# Patient Record
Sex: Female | Born: 1950 | Hispanic: Yes | Marital: Married | Smoking: Never smoker
Health system: Western US, Academic
[De-identification: ages and names within clinical notes are randomized; demographics above are authoritative.]

## PROBLEM LIST (undated history)

## (undated) DIAGNOSIS — H409 Unspecified glaucoma: Secondary | ICD-10-CM

## (undated) DIAGNOSIS — F32A Depression, unspecified: Secondary | ICD-10-CM

## (undated) DIAGNOSIS — F329 Major depressive disorder, single episode, unspecified: Secondary | ICD-10-CM

## (undated) DIAGNOSIS — M797 Fibromyalgia: Secondary | ICD-10-CM

## (undated) DIAGNOSIS — G43909 Migraine, unspecified, not intractable, without status migrainosus: Secondary | ICD-10-CM

## (undated) DIAGNOSIS — K219 Gastro-esophageal reflux disease without esophagitis: Secondary | ICD-10-CM

## (undated) DIAGNOSIS — M549 Dorsalgia, unspecified: Secondary | ICD-10-CM

## (undated) DIAGNOSIS — E7800 Pure hypercholesterolemia, unspecified: Secondary | ICD-10-CM

## (undated) DIAGNOSIS — E039 Hypothyroidism, unspecified: Secondary | ICD-10-CM

## (undated) DIAGNOSIS — I10 Essential (primary) hypertension: Secondary | ICD-10-CM

## (undated) DIAGNOSIS — I480 Paroxysmal atrial fibrillation: Secondary | ICD-10-CM

## (undated) DIAGNOSIS — E119 Type 2 diabetes mellitus without complications: Secondary | ICD-10-CM

## (undated) HISTORY — PX: CHOLECYSTECTOMY: SHX55

## (undated) HISTORY — DX: Essential (primary) hypertension: I10

## (undated) HISTORY — DX: Paroxysmal atrial fibrillation (CMS-HCC): I48.0

## (undated) HISTORY — DX: Pure hypercholesterolemia, unspecified: E78.00

## (undated) HISTORY — PX: HYSTERECTOMY: SHX81

## (undated) HISTORY — DX: Type 2 diabetes mellitus without complications (CMS-HCC): E11.9

## (undated) HISTORY — PX: TONSILLECTOMY AND ADENOIDECTOMY: SHX2683

## (undated) HISTORY — PX: BILE DUCT STENT PLACEMENT: SHX1227

## (undated) MED ORDER — LACTATED RINGERS IV SOLN
INTRAVENOUS | Status: AC
Start: 2016-12-18 — End: ?

## (undated) MED ORDER — LIDOCAINE HCL (PF) 1 % IJ SOLN
0.10 mL | INTRAMUSCULAR | Status: AC | PRN
Start: 2016-12-18 — End: ?

---

## 2009-03-17 ENCOUNTER — Ambulatory Visit (HOSPITAL_BASED_OUTPATIENT_CLINIC_OR_DEPARTMENT_OTHER): Admitting: Trauma Surgery

## 2009-05-12 ENCOUNTER — Ambulatory Visit (HOSPITAL_BASED_OUTPATIENT_CLINIC_OR_DEPARTMENT_OTHER): Admitting: Trauma Surgery

## 2009-06-23 ENCOUNTER — Ambulatory Visit (HOSPITAL_BASED_OUTPATIENT_CLINIC_OR_DEPARTMENT_OTHER): Admitting: Surgery

## 2009-06-23 ENCOUNTER — Ambulatory Visit (HOSPITAL_BASED_OUTPATIENT_CLINIC_OR_DEPARTMENT_OTHER): Payer: Self-pay | Admitting: Surgery

## 2009-10-07 ENCOUNTER — Institutional Professional Consult (permissible substitution) (HOSPITAL_BASED_OUTPATIENT_CLINIC_OR_DEPARTMENT_OTHER): Admitting: Rheumatology

## 2009-10-07 VITALS — BP 113/76 | HR 98 | Temp 98.2°F | Ht 65.0 in | Wt 184.0 lb

## 2009-10-07 MED ORDER — GLUCAGON (RDNA) 1 MG IJ KIT
1.00 mg | PACK | INTRAMUSCULAR | Status: DC | PRN
Start: ? — End: 2014-11-12

## 2009-10-07 MED ORDER — PROPRANOLOL HCL 20 MG OR TABS
20.00 mg | ORAL_TABLET | Freq: Two times a day (BID) | ORAL | Status: DC
Start: ? — End: 2014-10-11

## 2009-10-07 MED ORDER — AMITRIPTYLINE HCL 25 MG OR TABS
25.00 mg | ORAL_TABLET | Freq: Every evening | ORAL | Status: DC
Start: ? — End: 2016-08-30

## 2009-10-07 MED ORDER — SIMVASTATIN 20 MG OR TABS
20.00 mg | ORAL_TABLET | Freq: Every evening | ORAL | Status: DC
Start: ? — End: 2014-11-12

## 2009-10-07 MED ORDER — ACETAMINOPHEN 325 MG PO TABS
650.00 mg | ORAL_TABLET | ORAL | Status: DC | PRN
Start: ? — End: 2016-08-30

## 2009-10-07 MED ORDER — OXYCODONE-ACETAMINOPHEN 5-325 MG OR TABS
1.00 | ORAL_TABLET | Freq: Four times a day (QID) | ORAL | Status: DC | PRN
Start: ? — End: 2016-08-30

## 2009-10-07 MED ORDER — ASPIRIN 81 MG OR TABS
81.00 mg | ORAL_TABLET | Freq: Every day | ORAL | Status: DC
Start: ? — End: 2016-08-30

## 2009-10-07 MED ORDER — DIAZEPAM 5 MG OR TABS
5.00 mg | ORAL_TABLET | Freq: Two times a day (BID) | ORAL | Status: DC
Start: ? — End: 2016-08-30

## 2009-10-07 MED ORDER — LEVOTHYROXINE SODIUM 100 MCG OR TABS
88.00 ug | ORAL_TABLET | Freq: Every day | ORAL | Status: DC
Start: ? — End: 2020-03-22

## 2009-10-07 MED ORDER — LISINOPRIL 20 MG OR TABS
20.00 mg | ORAL_TABLET | Freq: Every day | ORAL | Status: DC
Start: ? — End: 2014-10-11

## 2009-10-07 MED ORDER — IBUPROFEN 600 MG OR TABS
600.00 mg | ORAL_TABLET | Freq: Four times a day (QID) | ORAL | Status: DC | PRN
Start: ? — End: 2014-10-11

## 2009-10-07 NOTE — Progress Notes (Signed)
 AVS printed and reviewed with the patient and her relative. They both verbalized understanding.  She will go back to her PCP and will try to obtain a referral for the Fibromyalgia clinic.      Patient Education  Identified learning needs: Diagnosis, Care Plan, Treatment (discussed with provider) encouraged pt with her plan of care. Learner: Patient  Barriers to learning: No Barriers  Readiness to learn: Acceptance  Method: Explanation and Handout  Treatment education given: Yes (discussed with provider)   Fall prevention education given: Yes  Pain education given: Yes  Response: Verbalizes understanding

## 2009-10-07 NOTE — Progress Notes (Signed)
 Subjective:  Cathy Lucas is an 59 year old female here for eval of chest pain and generalized joint pain and ANA positivity.PCP referred her to our clinic to r/o SLE.    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 Pt describes the pain as pressure-like on the right side of the chest, sharper over the rib cage, radiating to the R back, and sometime appearing on the L side. She took Cymbalta for one year with some relief, however due to medication cost, she switched to Amitriptylin which she is now taking with moderate benefit. In the last few months onset of fatigue and pain and stiffness of hands, elbows, shoulders, hip, knees. Pt says that she has pain every day and her hands and feet appear swollen. There is morning stiffness of the hands that rarely improves with the course of the day. Was prescribed Ibuprofen 600 mg PO TID which she is taking with moderate benefit. She also reports h/o back pain since many years attributed to scoliosis, which has been worsening in the last few months, she had Xray spine which showed evidence of spondilosis. C/o hair loss, dry eyes/dry mouth, papular itchy rash on arms and sporadic heartburns. Denies malar rash, oral ulcers.     PMH/co-morbidities  Depression  Hypothyroidism  HTN  Hysterectomy at the age of 33  Cholecystectomy in June 2009  Recent diagnosis of early glaucoma  Reports diagnosis of mitral valve problem  Reports h/o DM treated with  Metformin and gliburide however h/o hypoglycemic crisis, Pt carries around glucagone pen.    FHx  Mother with arthritis, father with RA    SHx  Used to work in office. Widow since May 03, 2007 (husband died after stressful disease course and she stopped working to take care of her husband). No EtOH, No Tob, No drugs.    Current outpatient prescriptions ordered prior to encounter   Medication Sig Dispense Refill   .  levothyroxine (SYNTHROID) 100 MCG tablet Take 100 mcg by mouth daily.       . propranolol (INDERAL) 20 MG tablet Take 20 mg by mouth 2 times daily.       . simvastatin (ZOCOR) 20 MG tablet Take 20 mg by mouth every evening.       Marland Kitchen lisinopril (PRINIVIL, ZESTRIL) 20 MG tablet Take 20 mg by mouth daily.       Marland Kitchen amitriptyline (ELAVIL) 25 MG tablet Take 25 mg by mouth nightly.       Marland Kitchen aspirin 81 MG tablet Take 81 mg by mouth daily.         Review of Systems:  No f/n/v/AP    Objective:  Filed Vitals:    10/07/09 1250   BP: 113/76   Pulse: 98   Temp: 98.2 F (36.8 C)   TempSrc: Oral   Height: 5\' 5"  (1.651 m)   Weight: 83.462 kg (184 lb)     Phys exam  Female afebrile in NAD  Skin: hyperpigmented patches in dermatomeric distribution right side of chest  HEENT: nc/at, EOMI, no oral lesions  Chest: CTAB  CV: RRR, S1&S2 nl, no m/r/g  Abdomen: tenderness to palpation of epigastric and periumbelical regions.  MSK: no swollen joints. There is diffuse tenderness to palpation of all joints and muscles. ROM eval difficult bc of tenderness.    Data:   From record  08/2009  HbA1c=5.9H  ANA=1:80  RF=8    Assessment/Plan  Cathy Lucas is a 59 year old female who presents to this clinic for an evaluation for generalized joint pain and ANA positivity 1:80  Chest pain compatible with posterpetic neuralgia. ANA positive low titer has low specificity, and there is no evidence of synovitis or symptoms suggesting lupus at this time. Diffuse muscoloskeletal tenderness in Pt with h/o depression and recent life stressors strongly suggestive of fibromyalgia syndrome.   Dx of fibromyalgia and treatment based on SSRI extensively discussed with Pt. Pt recommended to go back to PCP to discuss prescription of SSRI and possible combined treatment of neuralgia and fibromialgia and/or to be referred to fibromyalgia clinic.  RTC PRN in case new symptoms arise.  PT seen and discussed with Dr. Chaya Jan who agrees with assessment and plan.

## 2009-10-07 NOTE — Progress Notes (Signed)
 Attending Note:    Subjective:  I reviewed the history.  Patient interviewed and examined.  History of present illness (HPI):  New Patient   Pt with diffuse pain and post herpetic neuralgia. Has ANA 1:80. No other positives in SLE ROS.  Review of Systems (ROS): As per the fellow's note.  Past Medical, Family, Social History:  As per the fellow's  note.    Objective:   I have examined the patient and I concur with the fellow's exam and of note is diffuse tenderness, no synovitis, has heb nodes. Hyperpigmentation over area of shingles.  Assessment and plan reviewed with the fellow. I agree with the fellow's plan as documented. No evidence of SLE at this time. Pt has fibromyalgia. Discussed the diagnosis in detail and counseled about use of SSRIs. Refer back to PCP.  See the fellow's note for further details.

## 2010-04-21 ENCOUNTER — Telehealth (INDEPENDENT_AMBULATORY_CARE_PROVIDER_SITE_OTHER): Payer: Self-pay | Admitting: Pain Medicine

## 2010-04-21 NOTE — Telephone Encounter (Signed)
Left detail msg regarding insurance change, pt now has [facility] not contract w[facility]. Consult appt on 04/26/10 has been Cx. Claudia please closed encounter.

## 2010-04-26 ENCOUNTER — Encounter (INDEPENDENT_AMBULATORY_CARE_PROVIDER_SITE_OTHER): Admitting: Pain Medicine

## 2010-04-27 ENCOUNTER — Encounter (INDEPENDENT_AMBULATORY_CARE_PROVIDER_SITE_OTHER): Payer: Self-pay | Admitting: Pain Medicine

## 2012-05-31 HISTORY — PX: COLONOSCOPY: SHX174

## 2014-09-30 ENCOUNTER — Encounter (INDEPENDENT_AMBULATORY_CARE_PROVIDER_SITE_OTHER): Payer: Self-pay | Admitting: Medical

## 2014-09-30 ENCOUNTER — Ambulatory Visit (INDEPENDENT_AMBULATORY_CARE_PROVIDER_SITE_OTHER): Payer: Medicaid Other | Admitting: Medical

## 2014-09-30 VITALS — BP 119/74 | HR 87 | Ht 65.0 in | Wt 191.0 lb

## 2014-09-30 DIAGNOSIS — G43109 Migraine with aura, not intractable, without status migrainosus: Secondary | ICD-10-CM

## 2014-09-30 DIAGNOSIS — G250 Essential tremor: Secondary | ICD-10-CM

## 2014-09-30 DIAGNOSIS — R42 Dizziness and giddiness: Secondary | ICD-10-CM

## 2014-09-30 MED ORDER — DULOXETINE HCL 30 MG OR CPEP
30.00 mg | ORAL_CAPSULE | Freq: Every day | ORAL | Status: DC
Start: ? — End: 2016-11-20

## 2014-09-30 MED ORDER — LOSARTAN POTASSIUM PO
25.00 mg | Freq: Every day | ORAL | Status: DC
Start: ? — End: 2016-08-30

## 2014-09-30 MED ORDER — LATANOPROST OP
OPHTHALMIC | Status: DC
Start: ? — End: 2016-08-30

## 2014-09-30 MED ORDER — VALIUM PO
ORAL | Status: DC
Start: ? — End: 2014-10-11

## 2014-09-30 MED ORDER — ZOLPIDEM TARTRATE 5 MG OR TABS
3.50 mg | ORAL_TABLET | Freq: Every evening | ORAL | Status: DC | PRN
Start: ? — End: 2020-07-05

## 2014-09-30 MED ORDER — VITAMIN D PO
50000.00 [IU] | ORAL | Status: DC
Start: ? — End: 2020-07-05

## 2014-09-30 MED ORDER — METHOCARBAMOL 500 MG OR TABS
500.00 mg | ORAL_TABLET | ORAL | Status: DC
Start: ? — End: 2014-11-12

## 2014-09-30 MED ORDER — LEVOTHYROXINE SODIUM 112 MCG OR TABS
112.00 ug | ORAL_TABLET | Freq: Every day | ORAL | Status: DC
Start: ? — End: 2014-10-11

## 2014-09-30 MED ORDER — MELATONIN 5 MG OR TABS
1.00 mg | ORAL_TABLET | Freq: Every evening | ORAL | Status: DC
Start: ? — End: 2014-11-12

## 2014-09-30 MED ORDER — OXYCODONE-ACETAMINOPHEN 5-325 MG OR TABS
1.00 | ORAL_TABLET | Freq: Four times a day (QID) | ORAL | Status: DC | PRN
Start: ? — End: 2014-10-11

## 2014-09-30 MED ORDER — PANTOPRAZOLE SODIUM 40 MG OR TBEC: 40.00 mg | DELAYED_RELEASE_TABLET | Freq: Every day | ORAL | Status: AC

## 2014-09-30 MED ORDER — DORZOLAMIDE HCL OP
OPHTHALMIC | Status: DC
Start: ? — End: 2014-11-12

## 2014-09-30 MED ORDER — METOPROLOL SUCCINATE 100 MG OR TB24
100.00 mg | ORAL_TABLET | Freq: Every day | ORAL | Status: DC
Start: ? — End: 2014-11-12

## 2014-09-30 MED ORDER — LOVASTATIN 20 MG OR TABS
20.00 mg | ORAL_TABLET | Freq: Every evening | ORAL | Status: DC
Start: ? — End: 2014-11-12

## 2014-09-30 MED ORDER — GLIPIZIDE 5 MG OR TABS
5.00 mg | ORAL_TABLET | Freq: Two times a day (BID) | ORAL | Status: DC
Start: ? — End: 2016-11-20

## 2014-09-30 NOTE — Progress Notes (Addendum)
CC: headaches, tremor    Subjective:    History of Present Illness:  Cathy Lucas is a 64 year old female here for follow up regarding headaches and tremor. She was started on Topamax last visit for prophylaxis, however she stopped this medication after several days. She thought her headaches were better, which is why she stopped. Currently, the headache occur approximately 2 days per week. The pain described as a sharp pain alternating with throbbing and can involve the right temple and occasionally the right ear. The pain can last several hours, but less than 4 hours. She can have several episodes in one day. There are no autonomic symptoms. There is associated photophobia and phonophobia. She can feel nauseous, but there is no vomiting. The headaches are moderate in severity. There is no change in headache characteristics.There are no sensory, motor, or speech deficits. She thinks the headaches started around the time she underwent heart surgery in 2012. It sounds like this was associated with visual disturbance of flashing lights. She states that she had imaging of her brain at that time, which was normal.     The tremor is described as both an action and postural tremor. Her tremors are slightly improved since she decreased Cymbalta from 90 mg to 60 mg. She also notes less sweating at the lower dosage.     Her vision can be "foggy" during attacks. Sometimes, she will look at objects and they look double and "crooked." She has tried covering each eye, but this does not make a difference. She does feel a sense of vertigo when this occurs. This also started in 2012. No hearing loss or tinnitus. The entire episode lasts about a minute or so. She estimates that this occurs three times per month.     Triggers include: stress    Past Medical History   Diagnosis Date   . Diabetes (HCC)    . Hypercholesterolemia    . Hypertension        Social History     Social History   . Marital status: Married     Spouse  name: N/A   . Number of children: N/A   . Years of education: N/A     Occupational History   . Not on file.     Social History Main Topics   . Smoking status: Never Smoker   . Smokeless tobacco: Not on file   . Alcohol use Not on file   . Drug use: Not on file   . Sexual activity: Not on file     Other Topics Concern   . Not on file     Social History Narrative   . No narrative on file       Family History   Problem Relation Age of Onset   . Stroke Mother    . Heart Disease Mother    . Stroke Father    . Heart Disease Father          Current Outpatient Prescriptions:   .  acetaminophen (TYLENOL) 325 MG tablet, Take 650 mg by mouth every 4 hours as needed., Disp: , Rfl:   .  amitriptyline (ELAVIL) 25 MG tablet, Take 25 mg by mouth nightly., Disp: , Rfl:   .  aspirin 81 MG tablet, Take 81 mg by mouth daily., Disp: , Rfl:   .  Cholecalciferol (VITAMIN D PO), , Disp: , Rfl:   .  Diazepam (VALIUM PO), , Disp: , Rfl:   .  diazepam (VALIUM) 5 MG tablet, Take 5 mg by mouth 2 times daily., Disp: , Rfl:   .  DORZOLAMIDE HCL OP, , Disp: , Rfl:   .  DULoxetine (CYMBALTA) 30 MG capsule, Take 30 mg by mouth 3 times daily., Disp: , Rfl:   .  glipiZIDE (GLUCOTROL) 5 MG tablet, Take 5 mg by mouth 2 times daily., Disp: , Rfl:   .  glucagon (GLUCAGON EMERGENCY) 1 MG injection, 1 mg by IntraMUSCULAR route as needed., Disp: , Rfl:   .  ibuprofen (MOTRIN) 600 MG tablet, Take 600 mg by mouth every 6 hours as needed., Disp: , Rfl:   .  LATANOPROST OP, , Disp: , Rfl:   .  levothyroxine (SYNTHROID) 100 MCG tablet, Take 100 mcg by mouth daily., Disp: , Rfl:   .  levothyroxine (SYNTHROID) 112 MCG tablet, Take 112 mcg by mouth every morning (before breakfast)., Disp: , Rfl:   .  lisinopril (PRINIVIL, ZESTRIL) 20 MG tablet, Take 20 mg by mouth daily., Disp: , Rfl:   .  LOSARTAN POTASSIUM PO, , Disp: , Rfl:   .  lovastatin (MEVACOR) 20 MG tablet, Take 20 mg by mouth every evening., Disp: , Rfl:   .  melatonin 5 MG tablet, Take 5 mg by mouth at  bedtime., Disp: , Rfl:   .  methocarbamol (ROBAXIN) 500 MG tablet, Take 500 mg by mouth 1 hour after dinner., Disp: , Rfl:   .  metoprolol succinate (TOPROL XL) 100 MG XL tablet, Take 100 mg by mouth daily., Disp: , Rfl:   .  oxyCODONE-acetaminophen (PERCOCET) 5-325 MG per tablet, Take 1 tablet by mouth every 6 hours as needed., Disp: , Rfl:   .  oxyCODONE-acetaminophen (PERCOCET) 5-325 MG tablet, Take 1 tablet by mouth every 6 hours as needed for Severe Pain (Pain Score 7-10) or Breakthrough Pain., Disp: , Rfl:   .  pantoprazole (PROTONIX) 40 MG tablet, Take 40 mg by mouth daily., Disp: , Rfl:   .  propranolol (INDERAL) 20 MG tablet, Take 20 mg by mouth 2 times daily., Disp: , Rfl:   .  simvastatin (ZOCOR) 20 MG tablet, Take 20 mg by mouth every evening., Disp: , Rfl:   .  zolpidem (AMBIEN) 5 MG tablet, Take 5 mg by mouth nightly as needed for Insomnia., Disp: , Rfl:     ROS: as per HPI    Objective:   09/30/14  0833   BP: 119/74   Pulse: 87     General: well-appearing, well-nourished, no acute distress  HEENT: normocephalic, atraumatic  Neck: trachea is midline  Skin: no rash noted  Neurologic: Mental Status: Alert and oriented x 3. Speech: Fluent, no dysarthria. Comprehension is intact. Cranial Nerves: Pupils are equal, round, and reactive to light. Extraocular muscles are intact. Visual fields are full to confrontation bilaterally. Sensation is equal and intact to light touch in V1 through V3 distribution bilaterally. Symmetric facial movements. Hearing is intact bilaterally. Symmetric elevation of the palate. Normal strength of the trapezius and sternocleidomastoid muscles bilaterally. Tongue protrudes midline. Motor: Strength is 4/5 at the hips, but otherwise proximal and distal strength is 5/5. Tone is normal throughout.  There is no pronator drift. Minimal action tremor involving the hands. There is no resting tremor noted.  Sensation: Equal and intact to light touch bilateral upper and lower extremities.  Reflexes: 2+ throughout. Babinski sign is negative bilaterally. Coordination: Finger-nose testing and rapid alternating movements are intact bilaterally. Gait and Station: Gait is normal.  Psychiatric: anxious affect        Assessment/Plan:  Janey Petron is a 64 year old female with high frequency episodic migraine headaches. There has been no change in headache characteristics. She reportedly had imaging in 2012 when these headaches began, and I have requested these records. I encouraged her to restart the Topamax as she continues to have a high frequency. Given her cardiovascular disease, neither triptans nor NSAIDs are an ideal option and I would focus on preventative treatments.     Her tremor may also improve with Topamax, though this seems to be improving with the adjustment to her Cymbalta dosage. I again reassured her that she does not have Parkinson's disease.    She also describes vision changes that could be monocular diplopia. This could also be her awareness of nystagmus as these symptoms are in conjunction with vertigo. She has had these symptoms sporadically for several years. I do no think this represents a more worrisome neurologic issue such as stroke or myasthenia gravis. I encouraged her to bring this up with her ophthalmologist as well in case there is a disorder that is intrinsic to the eye itself.     1) Restart Topamax. Start with 25 (1 tab) mg nightly. Increase by 25 mg (1 tab) each week as tolerated until taking 100 mg (4 tabs) nightly.   2) Obtain imaging from [facility] in 2012  3) Follow up in 4-6 weeks    This was a visit of 25 minutes. Greater than 50% of the time was spent counseling the patient face-to-face regarding disease management.    Electronically signed by: Jeanelle Malling, PA on 09/30/2014, 10:21 AM     Encounter submitted for review by Jeanelle Malling, PA on 09/30/2014, 10:21 AM          Visit details reviewed and approved by supervising provider Crissie Figures, MD on 10/01/2014, 8:20 AM    Electronically signed by: Crissie Figures, MD on 10/01/2014, 8:20 AM

## 2014-09-30 NOTE — Patient Instructions (Addendum)
1) Restart Topamax. Start with 25 (1 tab) mg nightly. Increase by 25 mg (1 tab) each week as tolerated until taking 100 mg (4 tabs) nightly.   2) Obtain imaging from [facility] in 2012  3) Follow up in 4-6 weeks    Things You Can Do without a Prescription to help your Migraine Headaches      Food triggers -Most important trigger is any food/drink, which contains artificial  sweeteners, like DIET drinks.  MSG (monosodium glutamate - often in [place] food and salad dressing), aspartame (NutraSweet), red wine, processed foods containing nitrites, are common migraine triggers. Although these are the most common triggers, but each person may find individual trigger food. So basically, if any food /drink gives you a headache, don't eat/drink it!  Eat regular, small, healthy meals.    Get regular light exercise - studies show that regular cardiovascular exercise (5 days a week, 30 minutes a day) does an excellent job at preventing headaches sometimes even better than medications. Avoid overexertion & start gradually - even a daily walk helps.  Work on daily relaxation, deep breathing & good posture also. Yoga and meditation also have been shown to be very effective.     Drink plenty of fluids - Best fluid is plain water, avoid flavored water. About one-half to one gallon of water a day - this helps prevent headaches, and helps back and joint pain as well.  If you have a headache, drinking a big glass of water can help it get better.     Get regular sleep - sleep deprivation can contribute to chronic headaches. On the other hand oversleeping can trigger the headache. Patient with chronic headache should have regular sleep schedule If you snore, talk to your doctor - Obstructive Sleep Apnea is a common cause of daily headaches.    Caffeine -Although small amount of caffeine won't make the headache worse and even caffeine may help a headache if used infrequently, but studies show that regular caffeine use results in an  increased number of headaches for many people. In general excessive use or fluctuation in caffeine intake is a clear triggering factor for headache, this includes coffee, tea, caffeinated pop. Don't forget caffeine containing medicines like Excedrin or Fioricet has A LOT of caffeine.    Smoking- Smoking is a common headache trigger & cigarette smoke contains  chemicals that can make pain worse.  Besides headaches, smoking causes cancer, strokes & heart disease.  It makes arthritis and low back pain worse.    Smell- Strong perfumes / chemical smells can trigger migraines - beware of chemically scented air fresheners and candles.    Don't overuse your pain medicines - if you are using ANY "over-the-counter" pain medicine more than 3 days in a row or 15 days a month, or any prescription medication more than 9 days per month, there is a chance it could be causing rebound headaches. This is particularly true for narcotic drugs (like Tramadol, Vicodin, Percocet , Darvocet, Oxycontin,.)and barbiturate-caffeine containing medication (Fioricet, Fiorinal, Esgic,.).

## 2014-10-09 ENCOUNTER — Encounter (HOSPITAL_COMMUNITY): Payer: Self-pay | Admitting: Student in an Organized Health Care Education/Training Program

## 2014-10-09 ENCOUNTER — Inpatient Hospital Stay
Admission: EM | Admit: 2014-10-09 | Discharge: 2014-10-11 | DRG: 443 | Disposition: A | Discharge status: Home / Self Care | Payer: Medicaid Other | Source: Other Acute Inpatient Hospital | Attending: Hospitalist | Admitting: Hospitalist

## 2014-10-09 DIAGNOSIS — R160 Hepatomegaly, not elsewhere classified: Principal | ICD-10-CM | POA: Diagnosis present

## 2014-10-09 DIAGNOSIS — Z823 Family history of stroke: Secondary | ICD-10-CM

## 2014-10-09 DIAGNOSIS — E780 Pure hypercholesterolemia: Secondary | ICD-10-CM | POA: Diagnosis present

## 2014-10-09 DIAGNOSIS — Z800 Family history of malignant neoplasm of digestive organs: Secondary | ICD-10-CM

## 2014-10-09 DIAGNOSIS — M797 Fibromyalgia: Secondary | ICD-10-CM

## 2014-10-09 DIAGNOSIS — Z833 Family history of diabetes mellitus: Secondary | ICD-10-CM

## 2014-10-09 DIAGNOSIS — H409 Unspecified glaucoma: Secondary | ICD-10-CM | POA: Diagnosis present

## 2014-10-09 DIAGNOSIS — I10 Essential (primary) hypertension: Secondary | ICD-10-CM | POA: Diagnosis present

## 2014-10-09 DIAGNOSIS — R109 Unspecified abdominal pain: Secondary | ICD-10-CM | POA: Diagnosis present

## 2014-10-09 DIAGNOSIS — Z90710 Acquired absence of both cervix and uterus: Secondary | ICD-10-CM

## 2014-10-09 DIAGNOSIS — G43909 Migraine, unspecified, not intractable, without status migrainosus: Secondary | ICD-10-CM | POA: Diagnosis present

## 2014-10-09 DIAGNOSIS — Z9049 Acquired absence of other specified parts of digestive tract: Secondary | ICD-10-CM | POA: Diagnosis present

## 2014-10-09 DIAGNOSIS — E119 Type 2 diabetes mellitus without complications: Secondary | ICD-10-CM | POA: Diagnosis present

## 2014-10-09 DIAGNOSIS — K219 Gastro-esophageal reflux disease without esophagitis: Secondary | ICD-10-CM | POA: Diagnosis present

## 2014-10-09 DIAGNOSIS — Z6831 Body mass index (BMI) 31.0-31.9, adult: Secondary | ICD-10-CM

## 2014-10-09 DIAGNOSIS — Z8051 Family history of malignant neoplasm of kidney: Secondary | ICD-10-CM

## 2014-10-09 DIAGNOSIS — Z820 Family history of epilepsy and other diseases of the nervous system: Secondary | ICD-10-CM

## 2014-10-09 DIAGNOSIS — E785 Hyperlipidemia, unspecified: Secondary | ICD-10-CM | POA: Diagnosis present

## 2014-10-09 DIAGNOSIS — Z7982 Long term (current) use of aspirin: Secondary | ICD-10-CM

## 2014-10-09 DIAGNOSIS — E669 Obesity, unspecified: Secondary | ICD-10-CM | POA: Diagnosis present

## 2014-10-09 DIAGNOSIS — F329 Major depressive disorder, single episode, unspecified: Secondary | ICD-10-CM | POA: Diagnosis present

## 2014-10-09 DIAGNOSIS — Z8249 Family history of ischemic heart disease and other diseases of the circulatory system: Secondary | ICD-10-CM

## 2014-10-09 DIAGNOSIS — E039 Hypothyroidism, unspecified: Secondary | ICD-10-CM | POA: Diagnosis present

## 2014-10-09 DIAGNOSIS — R1011 Right upper quadrant pain: Secondary | ICD-10-CM

## 2014-10-09 HISTORY — DX: Depression, unspecified: F32.A

## 2014-10-09 HISTORY — DX: Unspecified glaucoma: H40.9

## 2014-10-09 HISTORY — DX: Dorsalgia, unspecified: M54.9

## 2014-10-09 HISTORY — DX: Fibromyalgia: M79.7

## 2014-10-09 HISTORY — DX: Gastro-esophageal reflux disease without esophagitis: K21.9

## 2014-10-09 HISTORY — DX: Hypothyroidism, unspecified: E03.9

## 2014-10-09 HISTORY — DX: Migraine, unspecified, not intractable, without status migrainosus: G43.909

## 2014-10-09 HISTORY — DX: Major depressive disorder, single episode, unspecified: F32.9

## 2014-10-09 LAB — GLUCOSE (POCT): GLUCOSE (POCT): 169 mg/dL — ABNORMAL HIGH (ref 70–99)

## 2014-10-09 MED ORDER — MORPHINE SULFATE 2 MG/ML IJ SOLN
2.00 mg | Freq: Once | INTRAMUSCULAR | Status: AC
Start: 2014-10-10 — End: 2014-10-10
  Administered 2014-10-10: 2 mg via INTRAVENOUS
  Filled 2014-10-09: qty 1

## 2014-10-09 MED ORDER — ONDANSETRON HCL 4 MG/2ML IV SOLN
4.0000 mg | Freq: Four times a day (QID) | INTRAMUSCULAR | Status: DC | PRN
Start: 2014-10-09 — End: 2014-10-10
  Administered 2014-10-10: 4 mg via INTRAVENOUS
  Filled 2014-10-09: qty 2

## 2014-10-09 NOTE — Interdisciplinary (Signed)
MD pager # [phone]  , notified that pt is admitted from [facility] .

## 2014-10-09 NOTE — H&P (Addendum)
MEDICINE INTERN HISTORY & PHYSICAL    CC: Abdominal pain    HPI: Cathy Lucas is a 64 year old female with a PMH of DM, HTN, HLD, initially presenting at [facility] presenting with abdominal pain, found to have large liver mass on CT and MRI, and transferred here for higher level of care.    Started with mid abdominal/epigastric pain evening prior to admission at [facility] on 8/18, bilious, non-bloody vomiting started today. Pain radiated upward then to RUQ, wrapping around to back. Now only pain in RUQ wrapping around to back. Pain is sharp, comes and goes, lasts 15 seconds at a time, randomly. Lying down and drinking Sprite made it better. No association with food, felt hungry with good appetite til today. Then started to have a headache, feels like a "brain freeze" on the right side of the head, +photo/phonophobia, but different from usual migraines. No neck pain aside from mild strain from so much vomiting. Also has incisional chest pain/palpitations for months. Seen providers for it, was told "that's just how it's going to be." Feels like "it's going to cave in," worse with stretching muscles. Doesn't feel at rest or with LE exercise.    Abdominal pain prior to cholecystectomy (done at [facility]) felt similar to this. Had pain simliar to this before again thought to be due to stone left in CBD, went in and found only sludge so now with stents (put in 2014 she estimates for both) at [facility]. Has been OK since 2014 til now. Had abdominal pain from shingles prior to all this, still with residual abdominal pain but this is different.     Denies recent travel or sick contacts. 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 Son had "stomach problems" and was diagnosed with H. Pylori.    Review of Systems:  Gen: +feeling "flu-ish" (general malaise, no URI sxs) 3 days prior to onset of abdominal pain, + chills, -fevers, -weight loss  Resp: +SOB  CV: +chest pain  GI: +abdominal pain per HPI, -diarrhea,  -constipation  GU: +yellower urine, +polyuria, -dysuria  Neuro: +episodes of confusion in past few months, noticed by others and patient, gotten worse with increase in Topamax dose for migraines    PMH:  Past Medical History   Diagnosis Date    Back pain      Needs L4 fusion    Depression     Diabetes ([place])     GERD (gastroesophageal reflux disease)     Glaucoma     Hypercholesterolemia     Hypertension     Hypothyroidism     Migraines        PSH:  Past Surgical History   Procedure Laterality Date    Mitral valve repair      Hysterectomy      Cholecystectomy      Tonsillectomy and adenoidectomy      Bile duct stent placement         MEDS:  No current facility-administered medications on file prior to encounter.      Current Outpatient Prescriptions on File Prior to Encounter   Medication Sig Dispense Refill    acetaminophen (TYLENOL) 325 MG tablet Take 650 mg by mouth every 4 hours as needed.      amitriptyline (ELAVIL) 25 MG tablet Take 25 mg by mouth nightly.      aspirin 81 MG tablet Take 81 mg by mouth daily.      Cholecalciferol (VITAMIN D  PO)       Diazepam (VALIUM PO)       diazepam (VALIUM) 5 MG tablet Take 5 mg by mouth 2 times daily.      DORZOLAMIDE HCL OP       DULoxetine (CYMBALTA) 30 MG capsule Take 30 mg by mouth daily.        glipiZIDE (GLUCOTROL) 5 MG tablet Take 5 mg by mouth 2 times daily.      glucagon (GLUCAGON EMERGENCY) 1 MG injection 1 mg by IntraMUSCULAR route as needed.      ibuprofen (MOTRIN) 600 MG tablet Take 600 mg by mouth every 6 hours as needed.      LATANOPROST OP       levothyroxine (SYNTHROID) 100 MCG tablet Take 112 mcg by mouth daily.        levothyroxine (SYNTHROID) 112 MCG tablet Take 112 mcg by mouth every morning (before breakfast).      lisinopril (PRINIVIL, ZESTRIL) 20 MG tablet Take 20 mg by mouth daily.      LOSARTAN POTASSIUM PO Take 25 mg by mouth daily.        lovastatin (MEVACOR) 20 MG tablet Take 20 mg by mouth every evening.       melatonin 5 MG tablet Take 10 mg by mouth at bedtime.        methocarbamol (ROBAXIN) 500 MG tablet Take 500 mg by mouth 1 hour after dinner.      metoprolol succinate (TOPROL XL) 100 MG XL tablet Take 100 mg by mouth daily.      oxyCODONE-acetaminophen (PERCOCET) 5-325 MG per tablet Take 1 tablet by mouth every 6 hours as needed.      oxyCODONE-acetaminophen (PERCOCET) 5-325 MG tablet Take 1 tablet by mouth every 6 hours as needed for Severe Pain (Pain Score 7-10) or Breakthrough Pain.      pantoprazole (PROTONIX) 40 MG tablet Take 40 mg by mouth daily.      propranolol (INDERAL) 20 MG tablet Take 20 mg by mouth 2 times daily.      simvastatin (ZOCOR) 20 MG tablet Take 20 mg by mouth every evening.      zolpidem (AMBIEN) 5 MG tablet Take 5 mg by mouth nightly as needed for Insomnia.         ALLERGIES:  Allergies   Allergen Reactions    Ceftriaxone Rash    Iodine Rash    Metformin Rash    Macrobid [Nitrofurantoin] Unspecified       SH:  Tobacco: Remote use as a teenager, none since  Alcohol: None  Illicit Drugs: None, no hx IVDU  Occupation: Disabled due to back, prior Administrator  Living Situation: Lives with son and daughter in law, daughter in laws 21 year old son.    FH:  Family History   Problem Relation Age of Onset    Stroke Mother     Heart Disease Mother     Stroke Father     Heart Disease Father    Brothers - Parkinson's  Mom - DM, MI at 31  Dad - DM, MI at 52  Sister with kidney cancer diagnosed recently, s/p partial nephrectomy, age 78    OBJECTIVE:  Vitals:  BP 141/72  Pulse 91  Temp 98.5 F (36.9 C)  Resp 18  Ht 5' 5"  (1.651 m)  Wt 86.2 kg (190 lb)  SpO2 95%  BMI 31.62 kg/m2    Temp  Avg: 98.5 F (36.9 C)  Min: 98.5 F (36.9 C)  Max: 98.5 F (36.9 C)  Pulse  Avg: 91  Min: 91  Max: 91  BP  Min: 141/72  Max: 141/72  Resp  Avg: 18  Min: 18  Max: 18  SpO2  Avg: 95 %  Min: 95 %  Max: 95 %  Most recent: SpO2: 95 % on   via O2 Device: None (Room air)    Wt Readings from Last 4  Encounters:   10/09/14 86.2 kg (190 lb)   09/30/14 86.6 kg (191 lb)   10/07/09 83.5 kg (184 lb)        Physical Exam:  Gen: NAD  HEENT: PERRL, MMM, mouth with yellow substance (from emesis)  Lungs: CTAB, no w/r/r  CV: RRR, +systolic click heard throughout  Abd: Soft, NT/ND, no guarding or rebound  Ext: WWP, 2+ DP  Neuro: A&Ox3    LABS:  None    MICRO:  None    IMAGING:  Performed at [facility]:  CXR 8/19:  No acute cardiopulmonary findings.    CT Abd/Pelvis w/o Contrast 8/19:  Fatty infiltration of liver.  5.6 x 8.6 x 7.8 cm density mass with a thin hyperechoic capsule at the dome of the liver.  Possible pancreatitis.    MR Abdomen w/o Contrast 8/19:  Stable 7.6 x 7.7 cm mass in right lobe of liver near the dome, which is incompletely characterized on this noncontrast MRI. There is a suggestion of mass contains complex fluid and may have undergone internal hemorrhage. A possible solid component is seen in the inferior aspect of the mass. The background liver does not appear cirrhotic on MRI. Leading differential diagnostic considerations include hepatic adenoma, FNH, or complex hepatic cyst. Hepatocellular carcinoma is not favored for this appearance, but would need to be excluded with a contrast study.  Fatty replacement of pancreas. No evidence of acute pancreatitis on MRI.  Status post cholecystectomy. Moderate dilatation of proximal intrahepatic and extrahepatic biliary tree. No stone or stricture is identified.    CT Head w/o Contrast 8/19:  No acute intracranial abnormality.    EKG 8/18:  Sinus tachycardia, ST depressions in V3-V6.      ASSESSMENT & PLAN:   Cathy Lucas is a 65 year old female with a PMH of DM, HTN, HLD, initially presenting at [facility] presenting with abdominal pain, found to have large liver mass on CT and MRI, and transferred here for higher level of care.    # Liver mass: Differential diagnosis includes hepatic hemangioma, focal nodular hyperplasia, hepatic adenoma, or benign cyst.  Most likely also causing epigastric/RUQ abdominal pain. Apparently has been present on prior imaging when she had acute cholecystitis in 2014 but has grown larger since.  - Upload images from [place]  - Morphine for pain control    # DM: On glipizide 5 mg BID at home.  - Low intensity SSI    # HTN:  - Metoprolol succinate 100 mg daily, losartan 25 mg daily    # HLD:  - Lovastatin 20 mg qhs, ASA 81 mg daily    # GERD:  - Lansoprazole 30 mg daily    # Hypothyroidism:  - Synthroid 112 mcg  - Check TSH    # Migraines:  - Topamax 50 mg qhs, amitriptyline 25 mg qhs    # Fibromyalgia/Depression:  - Cymbalta 30 mg BID    # Glaucoma:  - Latanoprost and dorzolamide drops    # FEN/GI: Diet NPO  # Pain  Control: Morphine  # GI Prophylaxis: Lansoprazole  # DVT Prophylaxis: SCDs  # Code Status: Full Code    This patient was seen and discussed with attending Dr. Van Clines, MD.    Constance Haw, M.D.  Internal Medicine PGY-1  Pager [phone]    ATTENDING PROGRESS NOTE ATTESTATION  I agree with the elements stated in the Resident/Intern H&P, specifically the HPI, ROS, PMH, social hx, family hx, all, meds, assessment and plan with the following additions or exceptions as stated herein. I also specifically performed the service or was physically present during the critical or key portions of the service furnished by the resident. We discussed these elements at the time of admission.      Subjective    I have reviewed the history. Interval history: Briefly, Ms Moisan is a 64 yo F With PMHx of DM, HTN, HLD, s/p cholecystectomy, post herpetic neuralgia, hypothyroidism who presented to [facility] on 8/18 for abdominal pain with associated n/v and found to have liver mass on CT with subsequent MRI concerning for internal hemorrhage of the lesion.     Objective    I have examined the patient and concur with the resident exam.    Assessment and Plan    I agree with the resident care plan.   #hepatic mass  with possible internal hemorrhage: ddx includes adenoma (though not in typical demographic, post hystertecomy), FNH, cyst. Hepatology notified, will see in the a.m. Upload OSH imaging, review with hepatology/IR/and or surgery in a.m. HDS. Hemoglobin at OSH wnl. Check labs here. Pain control   #hypothyroidism: continue synthroid   #DM: SSI     See the resident note for further details.    I spent 30 minutes of time on the patient care unit reviewing the patient's chart, examining the patient, reviewing diagnostic studies and providing counseling to the patient regarding treatment plan.

## 2014-10-10 ENCOUNTER — Encounter (HOSPITAL_COMMUNITY): Payer: Self-pay | Admitting: Student in an Organized Health Care Education/Training Program

## 2014-10-10 DIAGNOSIS — M797 Fibromyalgia: Secondary | ICD-10-CM

## 2014-10-10 DIAGNOSIS — R109 Unspecified abdominal pain: Secondary | ICD-10-CM | POA: Diagnosis present

## 2014-10-10 DIAGNOSIS — E039 Hypothyroidism, unspecified: Secondary | ICD-10-CM

## 2014-10-10 DIAGNOSIS — R160 Hepatomegaly, not elsewhere classified: Principal | ICD-10-CM

## 2014-10-10 DIAGNOSIS — I10 Essential (primary) hypertension: Secondary | ICD-10-CM

## 2014-10-10 LAB — URINALYSIS
Bilirubin: NEGATIVE
Blood: NEGATIVE
Glucose: NEGATIVE
Ketones: NEGATIVE
Leuk Esterase: NEGATIVE
Nitrite: NEGATIVE
Protein: NEGATIVE
Specific Gravity: 1.012 (ref 1.002–1.030)
Urobilinogen: NEGATIVE
pH: 7 (ref 5.0–8.0)

## 2014-10-10 LAB — HEMOGRAM, BLOOD
Hct: 31.5 % — ABNORMAL LOW (ref 34.0–45.0)
Hgb: 10.5 gm/dL — ABNORMAL LOW (ref 11.2–15.7)
MCH: 30.2 pg (ref 26.0–32.0)
MCHC: 33.3 % (ref 32.0–36.0)
MCV: 90.5 um3 (ref 79.0–95.0)
MPV: 9.6 fL (ref 9.4–12.4)
Plt Count: 200 10*3/uL (ref 140–370)
RBC: 3.48 10*6/uL — ABNORMAL LOW (ref 3.90–5.20)
RDW: 14.6 % — ABNORMAL HIGH (ref 12.0–14.0)
WBC: 9.8 10*3/uL (ref 4.0–10.0)

## 2014-10-10 LAB — CBC WITH DIFF, BLOOD
ANC-Automated: 8.8 10*3/uL — ABNORMAL HIGH (ref 1.6–7.0)
Abs Eosinophils: 0.1 10*3/uL (ref 0.1–0.5)
Abs Lymphs: 0.9 10*3/uL (ref 0.8–3.1)
Abs Monos: 0.7 10*3/uL (ref 0.2–0.8)
EOSINOPHILS: 1 % (ref 1–4)
Hct: 34.6 % (ref 34.0–45.0)
Hgb: 11.6 gm/dL (ref 11.2–15.7)
Lymphocytes: 9 % — ABNORMAL LOW (ref 19–53)
MCH: 30.3 pg (ref 26.0–32.0)
MCHC: 33.5 % (ref 32.0–36.0)
MCV: 90.3 um3 (ref 79.0–95.0)
MPV: 9.9 fL (ref 9.4–12.4)
Monocytes: 6 % (ref 5–12)
Plt Count: 235 10*3/uL (ref 140–370)
RBC: 3.83 10*6/uL — ABNORMAL LOW (ref 3.90–5.20)
RDW: 14.5 % — ABNORMAL HIGH (ref 12.0–14.0)
Segs: 84 % — ABNORMAL HIGH (ref 34–71)
WBC: 10.5 10*3/uL — ABNORMAL HIGH (ref 4.0–10.0)

## 2014-10-10 LAB — APTT, BLOOD: PTT: 28.3 s (ref 25.0–34.0)

## 2014-10-10 LAB — MAGNESIUM, BLOOD: Magnesium: 2.3 mg/dL (ref 1.6–2.4)

## 2014-10-10 LAB — COMPREHENSIVE METABOLIC PANEL, BLOOD
ALKALINE PHOS: 125 U/L (ref 35–140)
ALT (SGPT): 107 U/L — ABNORMAL HIGH (ref 0–33)
ANION GAP: 15 mmol/L (ref 7–15)
AST (SGOT): 139 U/L — ABNORMAL HIGH (ref 0–32)
Albumin: 3.9 g/dL (ref 3.5–5.2)
BUN: 16 mg/dL (ref 8–23)
Bicarbonate: 22 mmol/L (ref 22–29)
Bilirubin, Tot: 0.6 mg/dL (ref <1.20)
CHLORIDE: 101 mmol/L (ref 98–107)
Calcium: 8.4 mg/dL — ABNORMAL LOW (ref 8.5–10.6)
Creatinine: 0.66 mg/dL (ref 0.51–0.95)
GFR: >60 mL/min
Glucose: 163 mg/dL — ABNORMAL HIGH (ref 70–99)
Potassium: 4.1 mmol/L (ref 3.5–5.1)
Sodium: 138 mmol/L (ref 136–145)
TOTAL PROTEIN: 6.7 g/dL (ref 6.0–8.0)

## 2014-10-10 LAB — GLUCOSE (POCT)
GLUCOSE (POCT): 127 mg/dL — ABNORMAL HIGH (ref 70–99)
Glucose (POCT): 125 mg/dL — ABNORMAL HIGH (ref 70–99)
Glucose (POCT): 128 mg/dL — ABNORMAL HIGH (ref 70–99)
Glucose (POCT): 130 mg/dL — ABNORMAL HIGH (ref 70–99)

## 2014-10-10 LAB — PHOSPHORUS, BLOOD: PHOSPHOROUS: 2.3 mg/dL — ABNORMAL LOW (ref 2.7–4.5)

## 2014-10-10 LAB — TROPONIN T, BLOOD: Troponin T: <0.01 ng/mL (ref <0.01)

## 2014-10-10 LAB — CKMB+INDEX (NO CPK), BLOOD
CK-MB Index: 2.4 %
CK-MB: 2 ng/mL (ref 0.0–2.8)

## 2014-10-10 LAB — TSH, BLOOD: TSH: 2.08 u[IU]/mL (ref 0.27–4.20)

## 2014-10-10 LAB — PROTHROMBIN TIME, BLOOD
INR: 1.1
PT,PATIENT: 12.4 s (ref 9.7–12.5)

## 2014-10-10 LAB — CPK-CREATINE PHOSPHOKINASE, BLOOD: CPK: 82 U/L (ref 0–175)

## 2014-10-10 LAB — GLYCOSYLATED HGB(A1C), BLOOD: Glyco Hgb (A1C): 7.9 % — ABNORMAL HIGH (ref 4.8–5.9)

## 2014-10-10 LAB — LACTATE, BLOOD: Lactate: 1.5 mmol/L (ref 0.5–2.0)

## 2014-10-10 LAB — TYPE & SCREEN
ABO/RH: O POS
Antibody Screen: NEGATIVE

## 2014-10-10 MED ORDER — DEXTROSE (DIABETIC USE) 40 % OR GEL
1.0000 | ORAL | Status: DC | PRN
Start: 2014-10-10 — End: 2014-10-11

## 2014-10-10 MED ORDER — SENNOSIDES 8.8 MG/5ML OR SYRP
10.0000 mL | ORAL_SOLUTION | Freq: Every evening | ORAL | Status: DC
Start: 2014-10-10 — End: 2014-10-11
  Administered 2014-10-10: 17.6 mg via ORAL
  Filled 2014-10-10 (×2): qty 10

## 2014-10-10 MED ORDER — TOPIRAMATE 50 MG OR TABS
50.00 mg | ORAL_TABLET | Freq: Every evening | ORAL | Status: DC
Start: ? — End: 2014-11-12

## 2014-10-10 MED ORDER — ONDANSETRON HCL 4 MG/2ML IV SOLN
4.0000 mg | Freq: Four times a day (QID) | INTRAMUSCULAR | Status: DC | PRN
Start: 2014-10-10 — End: 2014-10-11
  Administered 2014-10-10 – 2014-10-11 (×4): 4 mg via INTRAVENOUS
  Filled 2014-10-10 (×5): qty 2

## 2014-10-10 MED ORDER — MORPHINE SULFATE 4 MG/ML IJ SOLN
4.0000 mg | INTRAMUSCULAR | Status: DC | PRN
Start: 2014-10-10 — End: 2014-10-11
  Administered 2014-10-10 – 2014-10-11 (×7): 4 mg via INTRAVENOUS
  Filled 2014-10-10 (×7): qty 1

## 2014-10-10 MED ORDER — LATANOPROST 0.005 % OP SOLN
1.0000 [drp] | Freq: Every evening | OPHTHALMIC | Status: DC
Start: 2014-10-10 — End: 2014-10-11
  Administered 2014-10-10 (×2): 1 [drp] via OPHTHALMIC
  Filled 2014-10-10: qty 2.5

## 2014-10-10 MED ORDER — LANSOPRAZOLE 30 MG OR CPDR
30.0000 mg | DELAYED_RELEASE_CAPSULE | Freq: Every day | ORAL | Status: DC
Start: 2014-10-10 — End: 2014-10-11
  Administered 2014-10-10 – 2014-10-11 (×2): 30 mg via ORAL
  Filled 2014-10-10 (×2): qty 1

## 2014-10-10 MED ORDER — DULOXETINE HCL 30 MG OR CPEP
30.0000 mg | ORAL_CAPSULE | Freq: Every day | ORAL | Status: DC
Start: 2014-10-10 — End: 2014-10-10

## 2014-10-10 MED ORDER — TOPIRAMATE 25 MG OR TABS
50.0000 mg | ORAL_TABLET | Freq: Every evening | ORAL | Status: DC
Start: 2014-10-10 — End: 2014-10-11
  Administered 2014-10-10: 50 mg via ORAL
  Filled 2014-10-10: qty 2

## 2014-10-10 MED ORDER — MELATONIN 3 MG OR TABS
3.0000 mg | ORAL_TABLET | Freq: Every evening | ORAL | Status: DC
Start: 2014-10-10 — End: 2014-10-11
  Administered 2014-10-10 (×2): 3 mg via ORAL
  Filled 2014-10-10 (×3): qty 1

## 2014-10-10 MED ORDER — DULOXETINE HCL 30 MG OR CPEP
30.0000 mg | ORAL_CAPSULE | Freq: Two times a day (BID) | ORAL | Status: DC
Start: 2014-10-10 — End: 2014-10-11
  Administered 2014-10-10 – 2014-10-11 (×4): 30 mg via ORAL
  Filled 2014-10-10 (×4): qty 1

## 2014-10-10 MED ORDER — GLUCAGON HCL (RDNA) 1 MG IJ SOLR
1.0000 mg | Freq: Once | INTRAMUSCULAR | Status: DC | PRN
Start: 2014-10-10 — End: 2014-10-11

## 2014-10-10 MED ORDER — SODIUM CHLORIDE 0.9 % IV SOLN
INTRAVENOUS | Status: DC
Start: 2014-10-10 — End: 2014-10-11
  Administered 2014-10-10 (×3): via INTRAVENOUS

## 2014-10-10 MED ORDER — METHOCARBAMOL 500 MG OR TABS
500.0000 mg | ORAL_TABLET | ORAL | Status: DC
Start: 2014-10-10 — End: 2014-10-11
  Administered 2014-10-10: 500 mg via ORAL
  Filled 2014-10-10: qty 1

## 2014-10-10 MED ORDER — SODIUM CHLORIDE 0.9% TKO INFUSION
INTRAVENOUS | Status: DC | PRN
Start: 2014-10-10 — End: 2014-10-11

## 2014-10-10 MED ORDER — LEVOTHYROXINE SODIUM 112 MCG OR TABS
112.0000 ug | ORAL_TABLET | Freq: Every day | ORAL | Status: DC
Start: 2014-10-10 — End: 2014-10-11
  Administered 2014-10-10 – 2014-10-11 (×2): 112 ug via ORAL
  Filled 2014-10-10 (×2): qty 1

## 2014-10-10 MED ORDER — MORPHINE SULFATE 2 MG/ML IJ SOLN
2.0000 mg | INTRAMUSCULAR | Status: DC | PRN
Start: 2014-10-10 — End: 2014-10-11
  Administered 2014-10-10 – 2014-10-11 (×2): 2 mg via INTRAVENOUS
  Filled 2014-10-10: qty 1

## 2014-10-10 MED ORDER — SODIUM CHLORIDE 0.9 % IJ SOLN (CUSTOM)
3.0000 mL | Freq: Three times a day (TID) | INTRAMUSCULAR | Status: DC
Start: 2014-10-10 — End: 2014-10-11
  Administered 2014-10-10 – 2014-10-11 (×5): 3 mL via INTRAVENOUS

## 2014-10-10 MED ORDER — ASPIRIN 81 MG OR CHEW
81.0000 mg | CHEWABLE_TABLET | Freq: Every day | ORAL | Status: DC
Start: 2014-10-10 — End: 2014-10-11
  Administered 2014-10-10 – 2014-10-11 (×2): 81 mg via ORAL
  Filled 2014-10-10 (×2): qty 1

## 2014-10-10 MED ORDER — LIDOCAINE VISCOUS 2 % MT SOLN
10.00 mL | Freq: Once | OROMUCOSAL | Status: AC
Start: 2014-10-10 — End: 2014-10-10
  Administered 2014-10-10: 10 mL via ORAL
  Filled 2014-10-10: qty 15

## 2014-10-10 MED ORDER — GLUCOSE 4 GM PO CHEW (CUSTOM)
4.0000 | CHEWABLE_TABLET | ORAL | Status: DC | PRN
Start: 2014-10-10 — End: 2014-10-11

## 2014-10-10 MED ORDER — LOVASTATIN 20 MG OR TABS
20.0000 mg | ORAL_TABLET | Freq: Every evening | ORAL | Status: DC
Start: 2014-10-10 — End: 2014-10-11
  Administered 2014-10-10 – 2014-10-11 (×3): 20 mg via ORAL
  Filled 2014-10-10 (×3): qty 1

## 2014-10-10 MED ORDER — DOCUSATE SODIUM 250 MG OR CAPS
250.0000 mg | ORAL_CAPSULE | Freq: Every day | ORAL | Status: DC | PRN
Start: 2014-10-10 — End: 2014-10-11

## 2014-10-10 MED ORDER — PB-HYOSCY-ATROPINE-SCOPOLAMINE 16.2 MG/5ML PO ELIX
10.00 mL | ORAL_SOLUTION | Freq: Once | ORAL | Status: AC
Start: 2014-10-10 — End: 2014-10-10
  Administered 2014-10-10: 10 mL via ORAL
  Filled 2014-10-10: qty 10

## 2014-10-10 MED ORDER — METOCLOPRAMIDE HCL 5 MG/ML IJ SOLN
10.0000 mg | Freq: Four times a day (QID) | INTRAMUSCULAR | Status: DC | PRN
Start: 2014-10-10 — End: 2014-10-11
  Administered 2014-10-10 (×3): 10 mg via INTRAVENOUS
  Filled 2014-10-10 (×3): qty 2

## 2014-10-10 MED ORDER — DEXTROSE 50 % IV SOLN
12.5000 g | INTRAVENOUS | Status: DC | PRN
Start: 2014-10-10 — End: 2014-10-11

## 2014-10-10 MED ORDER — INSULIN LISPRO (HUMAN) 100 UNIT/ML SC SOLN (CUSTOM)
1.0000 [IU] | Freq: Four times a day (QID) | INTRAMUSCULAR | Status: DC
Start: 2014-10-10 — End: 2014-10-10

## 2014-10-10 MED ORDER — AMITRIPTYLINE HCL 25 MG OR TABS
25.0000 mg | ORAL_TABLET | Freq: Every evening | ORAL | Status: DC
Start: 2014-10-10 — End: 2014-10-11
  Administered 2014-10-10 (×2): 25 mg via ORAL
  Filled 2014-10-10 (×2): qty 1

## 2014-10-10 MED ORDER — MELATONIN 5 MG OR TABS
10.0000 mg | ORAL_TABLET | Freq: Every evening | ORAL | Status: DC
Start: 2014-10-10 — End: 2014-10-10

## 2014-10-10 MED ORDER — INSULIN REGULAR HUMAN 100 UNIT/ML IJ SOLN
1.0000 [IU] | Freq: Four times a day (QID) | INTRAMUSCULAR | Status: DC
Start: 2014-10-10 — End: 2014-10-11

## 2014-10-10 MED ORDER — NALOXONE HCL 0.4 MG/ML IJ SOLN
0.1000 mg | INTRAMUSCULAR | Status: DC | PRN
Start: 2014-10-10 — End: 2014-10-11

## 2014-10-10 MED ORDER — SODIUM CHLORIDE 0.9 % IJ SOLN (CUSTOM)
3.0000 mL | INTRAMUSCULAR | Status: DC | PRN
Start: 2014-10-10 — End: 2014-10-11

## 2014-10-10 MED ORDER — ALUM & MAG HYDROXIDE-SIMETH 200-200-20 MG/5ML OR SUSP
30.00 mL | Freq: Once | ORAL | Status: AC
Start: 2014-10-10 — End: 2014-10-10
  Administered 2014-10-10: 30 mL via ORAL
  Filled 2014-10-10: qty 30

## 2014-10-10 MED ORDER — ONDANSETRON HCL 4 MG/2ML IV SOLN
4.00 mg | Freq: Once | INTRAMUSCULAR | Status: AC
Start: 2014-10-10 — End: 2014-10-10
  Administered 2014-10-10: 4 mg via INTRAVENOUS

## 2014-10-10 MED ORDER — MORPHINE SULFATE 2 MG/ML IJ SOLN
2.0000 mg | INTRAMUSCULAR | Status: DC | PRN
Start: 2014-10-10 — End: 2014-10-11
  Filled 2014-10-10: qty 1

## 2014-10-10 MED ORDER — DORZOLAMIDE HCL 2 % OP SOLN
1.0000 [drp] | Freq: Two times a day (BID) | OPHTHALMIC | Status: DC
Start: 2014-10-10 — End: 2014-10-11
  Administered 2014-10-10 (×3): 1 [drp] via OPHTHALMIC
  Filled 2014-10-10: qty 10

## 2014-10-10 NOTE — Plan of Care (Signed)
Problem: Falls, Risk of  Goal: Keep patient free from falls utilizing universal fall precautions  Outcome: Met  Pt is free from falls.  Bed at lowest setting, non-skid socks on, call light and phone within reach.  Performing hourly rounding and asking Pt the four "P's."  Pt understands to call for assistance.  Environment conducive for safety and health.  Will continue to monitor.         Goal: Ensure safe mobility of patient (Mobility)  Outcome: Met    Problem: Pain - Acute  Goal: Communication of presence of pain  Outcome: Met  Goal: Control of acute pain  Outcome: Met  Pt managing pain well with morphine 49m, IV, PRN.  Please to reference MAR.  Pt resting comfortably in room with no s/s of distress or pain.  Will continue to monitor.          Problem: Nausea/Vomiting  Goal: Absence of nausea  Outcome: Not Met  Pt states feeling nausea has improved with zofran. Pt has GI cocktail ordered as x1 dose. Pt understands medication is available and will ask when she is ready for her medication.  Will continue to monitor, assess, and tx.          Problem: Alteration in Blood Glucose  Goal: Glucose level within specified parameters  Outcome: Met  Pt BS 128 requiring no insulin coverage.  Pt shows no s/s of hyperglycemia and hypoglycemia.  Pt aware of s/s of low and high blood sugar.  Will continue to monitor, assess, and tx.

## 2014-10-10 NOTE — Progress Notes (Signed)
[facility]   LOS: 1 day     Subjective/Interval History:  Pt nauseous, vomiting x 2 bilious/ non bloody. Abdomen pain + unchanged. Son and daughter in law bedside and supplemented hx. Ongoing N/V x 2 days. Pt curious to know if the mass is giving her N/V. No fevers, chills. Last BM 2 days ago. D/w hepatology- would be discussing with hepatobiliary Sx team re: mass. Recs reviewed and orders placed.    Objective:    Vital Signs:    10/10/14  0801 10/10/14  0856 10/10/14  0857 10/10/14  1123   BP: (!) 89/51 (!) 87/52 94/49 114/56   Pulse: 75 77 77 81   Resp: 16 16  20    Temp:    98.9 F (37.2 C)   SpO2: 95% 94%  92%       Physical Exam:  Vitals reviewed  GEN: Pt in bed in moderate distress, N/V+  HENT - Mucous membranes dry, no oral ulcers, Nares patent, hearing intact.  Eyes -  No icterus, conjunctival clear.   Resp - Clear to auscultation bilaterally, No W/C/R  CVS - RRR, No murmurs , S1, S2 +, no thrill  Abd - Mild TTP diffusely, obese, ?distension,  +BS, No rebound tenderness, No Guarding  Ext - No pedal edema  Neuro - No acute focal neuro deficits, EOMI  Psych - AAOx3,  No SI/HI, anxious  Speech- fluent     Laboratory data:   Lab Results   Component Value Date    NA 138 10/10/2014    K 4.1 10/10/2014    CL 101 10/10/2014    BICARB 22 10/10/2014    BUN 16 10/10/2014    CREAT 0.66 10/10/2014    GLU 163 (H) 10/10/2014     8.4 (L) 10/10/2014     Lab Results   Component Value Date    WBC 10.5 (H) 10/10/2014    HGB 11.6 10/10/2014    HCT 34.6 10/10/2014    PLT 235 10/10/2014    SEG 84 (H) 10/10/2014    LYMPHS 9 (L) 10/10/2014    MONOS 6 10/10/2014    EOS 1 10/10/2014     Lab Results   Component Value Date    AST 139 (H) 10/10/2014    ALT 107 (H) 10/10/2014    ALK 125 10/10/2014    TBILI 0.60 10/10/2014    TP 6.7 10/10/2014    ALB 3.9 10/10/2014     Lab Results   Component Value Date    INR 1.1 10/10/2014    PTT 28.3 10/10/2014     Lab Results   Component Value Date    CPK 82 10/10/2014     [place] 2.0 10/10/2014    TROPONIN <0.01 10/10/2014     Lab Results   Component Value Date    PHUA 7.0 10/10/2014    SGUA 1.012 10/10/2014    GLUCOSEUA Negative 10/10/2014    KETONEUA Negative 10/10/2014    BLOODUA Negative 10/10/2014    PROTEINUA Negative 10/10/2014    LEUKESTUA Negative 10/10/2014    NITRITEUA Negative 10/10/2014    WBCUA 3-5 (A) 10/10/2014    RBCUA 0-2 10/10/2014       Assessment and Plan:    # Liver mass:   - D/w Hepatology - D/d includes hepatic hemangioma, focal nodular hyperplasia, hepatic adenoma, malignancy or benign cyst. Unclear about the progression since onset ? 10 years ago.   - Obtain  more records/images from OSH.  - IV opioids for pain control  - type and screen, transfuse for Hb <7 or actively bleeding. Recheck CBC in PM.   - Multiphase MRI to evaluate Liver mass, hepatitis panel, AFP.     #Nausea/Vomiting:   - Likely multifactorial- Liver mass, constipation, pain/opioids  - Zofran, Reglan, IVF, Bowel regimen.      # DM: Hold PO hypoglycemics  - Low intensity SSI     # HTN:  - Metoprolol succinate 100 mg daily, losartan 25 mg daily     # HLD:  - Lovastatin 20 mg qhs, ASA 81 mg daily     # GERD:  - Lansoprazole 30 mg daily     # Hypothyroidism:  - Synthroid 112 mcg  - Check TSH (reordered- no prior order)     # Migraines:  - Topamax 50 mg qhs, amitriptyline 25 mg qhs     # Fibromyalgia/Depression:  - Cymbalta 30 mg BID     # Glaucoma:  - Latanoprost and dorzolamide drops     # FEN/GI: Diet NPO  # GI Prophylaxis: Lansoprazole  # DVT Prophylaxis: SCDs  # Code Status: Full Code             DOS: 10/10/14    I independently examined the patient, reviewed radiology, EKG (as available) and previous records. Plan explained to patient and questions answered.      Oneita Kras, MD, [facility] Medicine  PID # [id number]

## 2014-10-10 NOTE — Interdisciplinary (Signed)
1st on call MD text paged # [phone]    Re: Cathy Lucas 917A: BP94/49, HR 77.

## 2014-10-10 NOTE — Plan of Care (Signed)
Problem: Falls, Risk of  Goal: Keep patient free from falls utilizing universal fall precautions  Outcome: Met  Fall precautions maintained,bed alarm on,hourly rounds are continued.pt calls for assistance.No fall or injury noted.        Problem: Pain - Acute  Goal: Communication of presence of pain  Outcome: Not Met  Pt is is admitted with severe abdominal pain, MD seen Morphine 25m iv given, pt is resting , will continue to monitor.    Problem: Nausea/Vomiting  Goal: Absence of nausea  Outcome: Not Met  Pt was vomiting continueosly  On admission , Zofran 449miv given pt stated fees better. NPO instructed with IV Ns at 10083mr started.    Problem: Alteration in Blood Glucose  Goal: Glucose level within specified parameters  Outcome: Met  Pt's  bsg monitored on admission  165m28m and no insulin given.Monitored for s/s of hypo/hyperglycemia.

## 2014-10-10 NOTE — Interdisciplinary (Signed)
1st on call MD text paged # [phone]    Re: Cathy Lucas 917A: PT states dizzy, light headed, and BP 89/51, MAP 64, HR 75, O2 93%, RR 18, Temp 98.8.   Pt is NPO. on NS @ 18m/hr. Please to consider bolus.  Thank you!

## 2014-10-10 NOTE — Consults (Signed)
Gastroenterology/Hepatology Consultation  Fellow: Justice Britain  Consulting Physician: Dr. Andreas Ohm  Requesting Physician: Oneita Kras, MD    Reason For Consultation: hepatic cyst? bleed    Thank you Dr. Oneita Kras, MD for requesting this consultation.    Chief Complaint: RUQ pain      History of Present Illness:   Patient is a 64 year old female with DM (A1c 7.9), obesity, s/p cholecystectomy 2013, 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    -was told over 10 years ago that she has a 6cm "cyst" in her liver.  She was kicked by a horse so she underwent abd imaging which incidentally revealed the cyst.  -cholecystectomy 2013 with stent placement for biliary sludge, altho currently without stents on imaging.  -ongoing RUQ pain, nausea  -no etoh, IVDA, no F of liver disease    Current Problem List:   Patient Active Problem List    Diagnosis Date Noted    Abdominal pain 10/10/2014    Liver mass 10/10/2014    Hypertension 10/10/2014    Hypothyroidism 10/10/2014    Fibromyalgia 10/08/2009       Past Medical History:  Past Medical History   Diagnosis Date    Back pain      Needs L4 fusion    Depression     Diabetes ([place])     Fibromyalgia     GERD (gastroesophageal reflux disease)     Glaucoma     Hypercholesterolemia     Hypertension     Hypothyroidism     Migraines        Past Surgical History:  Past Surgical History   Procedure Laterality Date    Mitral valve repair      Hysterectomy      Cholecystectomy      Tonsillectomy and adenoidectomy      Bile duct stent placement         Social History:  Social History     Social History    Marital status: Married     Spouse name: N/A    Number of children: N/A    Years of education: N/A     Social History Main Topics    Smoking status: Never Smoker    Smokeless tobacco: Not on file    Alcohol use No    Drug use: No    Sexual  activity: Not on file     Other Topics Concern    Not on file     Social History Narrative       Family History:  Family history of colorectal cancer or inflammatory bowel disease:  Family History   Problem Relation Age of Onset    Stroke Mother     Heart Disease Mother     Stroke Father     Heart Disease Father        Allergy:  Allergies   Allergen Reactions    Ceftriaxone Rash    Iodine Rash    Metformin Rash    Macrobid [Nitrofurantoin] Unspecified       Current Medications:  Current Facility-Administered Medications   Medication    aluminum-magnesium-simethicone (MAG-AL PLUS) 200-200-20 MG/5ML suspension 30 mL    amitriptyline (ELAVIL) tablet 25 mg    aspirin chewable tablet 81 mg    belladonna-PHENobarbital (DONNATAL) 16.2 MG/5ML elixir 10 mL    dextrose 50 % solution 12.5 g  docusate sodium (COLACE) capsule 250 mg    dorzolamide (TRUSOPT) 2 % ophthalmic solution 1 drop    DULoxetine (CYMBALTA) capsule 30 mg    glucagon (GLUCAGON) injection 1 mg    glucose chewable tablet 16 g    glucose oral gel 1 Tube    insulin regular (HUMULIN,NOVOLIN) injection 1-10 Units    lansoprazole (PREVACID) DR capsule 30 mg    latanoprost (XALATAN) 0.005 % ophthalmic solution 1 drop    levothyroxine (SYNTHROID) tablet 112 mcg    lidocaine (XYLOCAINE) 2 % viscous solution 10 mL    lovastatin (MEVACOR) tablet 20 mg    melatonin tablet 3 mg    methocarbamol (ROBAXIN) tablet 500 mg    metoclopramide (REGLAN) injection 10 mg    morphine injection 2 mg    morphine injection 2 mg    morphine injection 4 mg    nalOXone (NARCAN) injection 0.1 mg    ondansetron (ZOFRAN) injection 4 mg    sennosides (SENOKOT) syrup 17.6 mg    sodium chloride (PF) 0.9 % flush 3 mL    sodium chloride (PF) 0.9 % flush 3 mL    sodium chloride 0.9 % TKO infusion    sodium chloride 0.9% infusion    topiramate (TOPAMAX) tablet 50 mg       Physical examination:  BP 114/56  Pulse 81  Temp 98.9 F (37.2 C)  Resp 20  Ht _0   (1.651 m)  Wt 86.2 kg (190 lb)  SpO2 92%  BMI 31.62 kg/m2 Body mass index is 31.62 kg/(m^2).  Wt Readings from Last 2 Encounters:   10/09/14 86.2 kg (190 lb)   09/30/14 86.6 kg (191 lb)      Blood Pressure   10/10/14 114/56   09/30/14 119/74     Temperature:  [98.1 F (36.7 C)-98.9 F (37.2 C)] 98.9 F (37.2 C) (08/20 1123)  Blood pressure (BP): (84-141)/(45-76) 114/56 (08/20 1123)  Heart Rate:  [74-94] 81 (08/20 1123)  Respirations:  [16-20] 20 (08/20 1123)  Pain Score: 7 (08/20 1146)  O2 Device: None (Room air) (08/20 1123)  SpO2:  [92 %-95 %] 92 % (08/20 1123)    General: Well developed, well nourished and in no apparent distress.   Affect: Normal, pleasant  HEENT: NC/AT, no scleral icterus, mucus membranes moist.   Respiratory: Clear to auscultation bilaterally. Normal respiratory effort.   Cardiovascular: Regular rate and rhythm without murmurs, rubs or gallops.   Abdomen: Soft. Non-tender. Bowel sound present. Non-distended. No rebound. No guarding.  Extremities: No peripheral edema. Warm and well-perfused.   Skin: No rashes, no jaundice.    Labs:  I have reviewed the labs listed below.    CBC  Lab Results   Component Value Date    WBC 9.8 10/10/2014    HGB 10.5 10/10/2014    HCT 31.5 10/10/2014    MCV 90.5 10/10/2014    PLT 200 10/10/2014     Chemistry  Lab Results   Component Value Date    NA 138 10/10/2014    K 4.1 10/10/2014    CL 101 10/10/2014    BICARB 22 10/10/2014    BUN 16 10/10/2014    CREAT 0.66 10/10/2014    GLU 163 10/10/2014     Liver  Lab Results   Component Value Date    TBILI 0.60 10/10/2014    AST 139 10/10/2014    ALT 107 10/10/2014    ALK 125 10/10/2014    ALB 3.9 10/10/2014  [place]  Lab Results   Component Value Date    INR 1.1 10/10/2014    PTT 28.3 10/10/2014       Images:  I have reviewed the imaging reports listed below.      Endoscopy:      Pathology:      Assessment and Plan:  Patient is a 64 year old female with DM (A1c 7.9), obesity, s/p cholecystectomy 2013, previously known  hepatic "cyst" for over a decade, who presents with sudden onset RUQ pain 8/18 found on imaging to have a possible 7.6cm hepatic cyst with bleed.  Image reivewed with radiologist.  Appears to be a cystic structure at the dome of the liver, less likely mass.  Dense material within the cyst, which could represent blood.  She could have had spontaneous bleeding into the cyst which led her to have pain.    -MRI liver multiphase with gadovist to fully characterize the lesion  -check another CBC later today  -pls obtain images of liver from [place] (had CT done in 2013 at time of [facility])  -check hepatitis B and C serologies  -will ask our hepatobiliary surgical team evaluate the pt     Thank you for the consult.  Please page with any questions.    Patient discussed with attending, Dr. Andreas Ohm    Electronically Signed:   Justice Britain, MD  GI & Hepatology Fellow  10/10/2014  2:44 PM

## 2014-10-11 DIAGNOSIS — J9811 Atelectasis: Secondary | ICD-10-CM

## 2014-10-11 DIAGNOSIS — J90 Pleural effusion, not elsewhere classified: Secondary | ICD-10-CM

## 2014-10-11 DIAGNOSIS — I288 Other diseases of pulmonary vessels: Secondary | ICD-10-CM

## 2014-10-11 LAB — GLUCOSE (POCT)
GLUCOSE (POCT): 121 mg/dL — ABNORMAL HIGH (ref 70–99)
GLUCOSE (POCT): 120 mg/dL — ABNORMAL HIGH (ref 70–99)
GLUCOSE (POCT): 133 mg/dL — ABNORMAL HIGH (ref 70–99)
Glucose (POCT): 141 mg/dL — ABNORMAL HIGH (ref 70–99)

## 2014-10-11 LAB — LIVER PANEL, BLOOD
ALBUMIN: 3.3 g/dL — ABNORMAL LOW (ref 3.5–5.2)
ALT (SGPT): 57 U/L — ABNORMAL HIGH (ref 0–33)
AST (SGOT): 39 U/L — ABNORMAL HIGH (ref 0–32)
Alkaline Phos: 103 U/L (ref 35–140)
BILIRUBIN, TOT: 0.54 mg/dL (ref <1.20)
Bilirubin, Dir: <0.2 mg/dL (ref <0.2)
Total Protein: 6 g/dL (ref 6.0–8.0)

## 2014-10-11 LAB — HEMOGRAM, BLOOD
HGB: 10.4 gm/dL — ABNORMAL LOW (ref 11.2–15.7)
Hct: 31.6 % — ABNORMAL LOW (ref 34.0–45.0)
MCH: 30 pg (ref 26.0–32.0)
MCHC: 32.9 % (ref 32.0–36.0)
MCV: 91.1 um3 (ref 79.0–95.0)
MPV: 10.1 fL (ref 9.4–12.4)
Plt Count: 205 10*3/uL (ref 140–370)
RBC: 3.47 10*6/uL — ABNORMAL LOW (ref 3.90–5.20)
RDW: 14.8 % — ABNORMAL HIGH (ref 12.0–14.0)
WBC: 10.1 10*3/uL — ABNORMAL HIGH (ref 4.0–10.0)

## 2014-10-11 LAB — HEPATITIS B SURFACE AB, QUANT, BLOOD: HBsAb,Qt: <3.5 m[IU]/mL

## 2014-10-11 LAB — HEPATITIS B CORE AB TOTAL: HBcAb Total: NONREACTIVE

## 2014-10-11 LAB — HEPATITIS C AB, BLOOD: Hepatitis C Ab: NONREACTIVE

## 2014-10-11 LAB — MRSA SURVEILLANCE CULTURE

## 2014-10-11 LAB — ALPHA FETOPROTEIN, BLOOD: AFP: 1.9 ng/mL (ref 0.0–8.3)

## 2014-10-11 MED ORDER — ACETAMINOPHEN 325 MG PO TABS
650.00 mg | ORAL_TABLET | Freq: Once | ORAL | Status: AC | PRN
Start: 2014-10-11 — End: 2014-10-11
  Administered 2014-10-11: 650 mg via ORAL
  Filled 2014-10-11: qty 2

## 2014-10-11 MED ORDER — INSULIN LISPRO (HUMAN) 100 UNIT/ML SC SOLN (CUSTOM)
1.0000 [IU] | Freq: Four times a day (QID) | INTRAMUSCULAR | Status: DC
Start: 2014-10-11 — End: 2014-10-11

## 2014-10-11 NOTE — Interdisciplinary (Signed)
Web page sent to MD, first to call: "Re: Pt. Cathy Lucas. Rm. 917A. Pt found with 02 saturation 87% on room air during midnight vitals.  Placed on 2 L NC now satting at 96%.  Vitals: BP 125/59, HR 93.  No orders for oxygen protocol.  Please place orders. Thank you."

## 2014-10-11 NOTE — Interdisciplinary (Signed)
Patient left at [place]. All belongings taken from room by patient.

## 2014-10-11 NOTE — Discharge Instructions (Signed)
Diagnosis and Reason for Admission    You were admitted to the hospital for the following reason(s):  Abdominal pain  Your full diagnosis list is located on this After Visit Summary in the Hospital Problems section.    What [place] Hospital Stay    The main tests and treatments done for you during this hospitalization were:    X-ray Chest Frontal And Lateral    The following evaluation is still important to complete after discharge from the hospital:  Follow up with hepatology in 1 week  NEED TO GET MRI ABDOMEN ASAP    Instructions for After Discharge    Your diet at home should be a diabetic (low-sugar) diet.    Your activity level at home should be:  as much exercise or activity as you can tolerate.    Specific activity restrictions:    Do not drive while taking narcotic pain medications.    Wound or tube care instructions:  None    Your medication list is located on this After Visit Summary in the Current Discharge Medication List section.  Your nurse will review this information with you before you leave the hospital.    It is very important for you to keep a current medication list with you in order to assist your doctors with your medical care.  Bring this After Visit Summary with you to your follow up appointments.         Reasons to Contact a Doctor Urgently    Call 911 or return to the hospital immediately if:  Worsening abdominal pain, Inability to tolerate oral intake, lightheadedness , chest pain    Your hospital physician at the time of hospital discharge is Jamaurion Slemmer.    Your physicians strive to explain things in a way you can understand.  If you have any questions or concerns about your hospital care or your medications, your hospital physician can be contacted in the following manner:  [place] phone triage at [phone].    New problems and symptoms unrelated to your hospitalization are best addressed by your primary outpatient physician (PCP), as are requests for  medication refills or appointment referrals after hospital discharge.  However, if new issues arise before you can see or contact your PCP, your hospital physician may be able to assist with these issues during this time.    What Needs to Happen Next After Discharge -- Appointments and Follow Up    Any appointments already scheduled at [facility] clinics will be listed in the Future Appointments section at the top of this After Visit Summary.      Sometimes tests performed in the hospital do not yet have results by the time a patient goes home.  The following key tests will need to be followed up at your next appointment: None    Medical Home Information    Your primary care provider or clinic currently on file at [place] is: [facility], [facility] Given to You (if applicable)

## 2014-10-11 NOTE — Interdisciplinary (Signed)
Discharge instructions given. Patient dressed. IV taken out and belongings gathered. Waiting for son to come to hospital. Endorsed to incoming RN Kyra Searles).

## 2014-10-11 NOTE — Discharge Summary (Incomplete Revision)
Date of Admission:  10/09/2014  Date of Discharge:  10/11/2014    Patient Name:  Cathy Lucas    Principal Diagnosis (required):  Abdominal Pain    Hospital Problem List (required):  Active Hospital Problems    Diagnosis    Abdominal pain [R10.9]    Liver mass [R16.0]    Hypertension [I10]    Hypothyroidism [E03.9]    Fibromyalgia [M79.7]      Resolved Hospital Problems    Diagnosis   No resolved problems to display.       Consultations Obtained During This Hospitalization:  Hepatology    Key consultant recommendations:  -MRI liver multiphase with gadovist to fully characterize the lesion. If she cannot get this today, ok to get as outpt. Pt also requesting premeds for contrast allergy (hives)  -pls obtain images of liver from [facility] (had CT done in 2013 at time of CCY)  -has been evaluted by our heptobiliary team. If cyst remains symptomatic, can consider fenestrated intraab drain. However, would try to avoid surgery.    Reason for Admission to the Hospital / History of Present Illness:  Per admitting H&P - Rhue is a 64 year old female with a PMH of DM, HTN, HLD, initially presenting at [facility] presenting with abdominal pain, found to have large liver mass on CT and MRI, and transferred here for higher level of care.    Started with mid abdominal/epigastric pain evening prior to admission at [facility] on 8/18, bilious, non-bloody vomiting started today. Pain radiated upward then to RUQ, wrapping around to back. Now only pain in RUQ wrapping around to back.    Hospital Course by Problem (required):      Tests Outstanding at Discharge Requiring Follow Up:  None    Discharge Condition (required):  Stable.    Key Physical Exam Findings at Discharge:  AAOx3  Eomi/perla   cta b/l s1s2 rrr  abd soft nt nd +bs  Anxious     Discharge Diet:  Diabetic / low-carbohydrate.    Discharge Medications:     What To Do With Your Medications      CHANGE how you take these medications       Add'l Info    diazepam 5 MG  tablet   Commonly known as:  VALIUM   Take 5 mg by mouth 2 times daily.    Refills:  0   What changed:  Another medication with the same name was removed. Continue taking this medication, and follow the directions you see here.       levothyroxine 100 MCG tablet   Commonly known as:  SYNTHROID   Take 112 mcg by mouth daily.    Refills:  0   What changed:  Another medication with the same name was removed. Continue taking this medication, and follow the directions you see here.       PERCOCET 5-325 MG tablet   Take 1 tablet by mouth every 6 hours as needed.   Generic drug:  oxyCODONE-acetaminophen    Refills:  0   What changed:  Another medication with the same name was removed. Continue taking this medication, and follow the directions you see here.         CONTINUE taking these medications       Add'l Info    amitriptyline 25 MG tablet   Commonly known as:  ELAVIL   Take 25 mg by mouth nightly.    Refills:  0  aspirin 81 MG tablet   Take 81 mg by mouth daily.    Refills:  0       DORZOLAMIDE HCL OP    Refills:  0       DULoxetine 30 MG capsule   Commonly known as:  CYMBALTA   Take 30 mg by mouth daily.    Refills:  0       glipiZIDE 5 MG tablet   Commonly known as:  GLUCOTROL   Take 5 mg by mouth 2 times daily.    Refills:  0       GLUCAGON EMERGENCY 1 MG injection   1 mg by IntraMUSCULAR route as needed.   Generic drug:  glucagon    Refills:  0       LATANOPROST OP    Refills:  0       LOSARTAN POTASSIUM PO   Take 25 mg by mouth daily.    Refills:  0       lovastatin 20 MG tablet   Commonly known as:  MEVACOR   Take 20 mg by mouth every evening.    Refills:  0       melatonin 5 MG tablet   Take 10 mg by mouth at bedtime.    Refills:  0       methocarbamol 500 MG tablet   Commonly known as:  ROBAXIN   Take 500 mg by mouth 1 hour after dinner.    Refills:  0       metoprolol succinate 100 MG XL tablet   Commonly known as:  TOPROL XL   Take 100 mg by mouth daily.    Refills:  0       pantoprazole 40 MG tablet      Commonly known as:  PROTONIX   Take 40 mg by mouth daily.    Refills:  0       simvastatin 20 MG tablet   Commonly known as:  ZOCOR   Take 20 mg by mouth every evening.    Refills:  0       topiramate 50 MG tablet   Commonly known as:  TOPAMAX   Take 50 mg by mouth at bedtime.    Refills:  0       TYLENOL 325 MG tablet   Take 650 mg by mouth every 4 hours as needed.   Generic drug:  acetaminophen    Refills:  0       VITAMIN D PO    Refills:  0       zolpidem 5 MG tablet   Commonly known as:  AMBIEN   Take 5 mg by mouth nightly as needed for Insomnia.    Refills:  0         STOP taking these medications          ibuprofen 600 MG tablet   Commonly known as:  MOTRIN       lisinopril 20 MG tablet   Commonly known as:  PRINIVIL, ZESTRIL       propranolol 20 MG tablet   Commonly known as:  INDERAL             Allergies:  Allergies   Allergen Reactions    Ceftriaxone Rash    Iodine Rash    Metformin Rash    Macrobid [Nitrofurantoin] Unspecified       Discharge Disposition:  Home.    Discharge Code Status:  Full code /  full care  This code status is not changed from the time of admission.    Follow Up Appointments:    Scheduled appointments:  Future Appointments  Date Time Provider [place]   10/28/2014 9:00 AM Ladon Applebaum, [facility] [place]       For appointments requested for after discharge that have not yet been scheduled, refer to the Post Discharge Referrals section of the After Visit Summary.    Discharging 46 Contact Information:  [facility] phone triage at [phone].

## 2014-10-11 NOTE — Progress Notes (Signed)
GI/Hepatology Daily Progress Note:    Consult Fellow: Justice Britain, MD  Consult Attending:Dr Andreas Ohm  , MD    10/11/14   Current Hospital Stay:   2 days - Admitted on: 10/09/2014    Events/Subjective:  -improved pain  -awaiting MRI    Objective:  Vital Signs:  Temperature:  [97.5 F (36.4 C)-99 F (37.2 C)] 98.6 F (37 C) (08/21 1058)  Blood pressure (BP): (92-136)/(47-63) 136/63 (08/21 1058)  Heart Rate:  [70-93] 86 (08/21 1058)  Respirations:  [16-18] 18 (08/21 1058)  Pain Score: 8 (08/21 1112)  O2 Device: Nasal cannula (08/21 1058)  O2 Flow Rate (L/min):  [2 l/min] 2 l/min (08/21 1058)  SpO2:  [87 %-97 %] 97 % (08/21 1058)    Wt Readings from Last 1 Encounters:   10/09/14 86.2 kg (190 lb)       Intake/Output (Current Shift):       Physical Exam:  Gen: A&O x 3 in NAD  HEENT: PERRL, OP clear  Lungs: CTAB  Heart: RRR; no r/m/g  Abd: Soft, +normoactive BS, mildly ttp over RUQ  Ext: warm, no edema  Skin: No rashes    Laboratory data:  Labs reviewed:  Lab Results   Component Value Date    NA 138 10/10/2014    K 4.1 10/10/2014    CL 101 10/10/2014    BICARB 22 10/10/2014    BUN 16 10/10/2014    CREAT 0.66 10/10/2014    GLU 163 (H) 10/10/2014    [place] 8.4 (L) 10/10/2014     Lab Results   Component Value Date    WBC 10.1 (H) 10/11/2014    HGB 10.4 (L) 10/11/2014    HCT 31.6 (L) 10/11/2014    PLT 205 10/11/2014     Lab Results   Component Value Date    WBC 10.1 (H) 10/11/2014    HGB 10.4 (L) 10/11/2014    HCT 31.6 (L) 10/11/2014    PLT 205 10/11/2014    SEG 84 (H) 10/10/2014    LYMPHS 9 (L) 10/10/2014    MONOS 6 10/10/2014    EOS 1 10/10/2014     Lab Results   Component Value Date    AST 39 (H) 10/11/2014    ALT 57 (H) 10/11/2014    ALK 103 10/11/2014    TBILI 0.54 10/11/2014    DBILI <0.2 10/11/2014    TP 6.0 10/11/2014    ALB 3.3 (L) 10/11/2014     Lab Results   Component Value Date    INR 1.1 10/10/2014    PTT 28.3 10/10/2014       Assessment and Plan:  64 year old female with DM (A1c 7.9), obesity, s/p cholecystectomy  2013, 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 Image reivewed with radiologist. Appears to be a cystic structure at the dome of the liver, less likely mass. Dense material within the cyst, which could represent blood. She could have had spontaneous bleeding into the cyst which led her to have pain.  HBV and HCV neg.    -MRI liver multiphase with gadovist to fully characterize the lesion.  If she cannot get this today, ok to get as outpt.  Pt also requesting premeds for contrast allergy (hives)  -pls obtain images of liver from [place] (had CT done in 2013 at time of CCY)  -has been evaluted by  our heptobiliary team.  If cyst remains symptomatic, can consider fenestrated intraab drain.  However, would try to avoid surgery.  -F/u in liver clinic (pls place referral)    Thank you for the consult. Please page with any questions.    Patient discussed with Dr. Andreas Ohm    Electronically signed by:  Justice Britain  Gastroenterology Fellow  10/11/14  11:42 AM

## 2014-10-11 NOTE — Plan of Care (Signed)
Problem: Falls, Risk of  Goal: Keep patient free from falls utilizing universal fall precautions  Outcome: Met  Patient AOx4. Patient has nonskid socks, bed in lowest position,call light within reach. Patient free from falls.     Problem: Pain - Acute  Goal: Communication of presence of pain  Outcome: Not Met  Patient has had uncontrolled pain throughout the day. Sleep, ice packs and repositioning has helped with her pain but she has used two rescue doses on AM shift. Pain in epigastric and head region.     Problem: Nausea/Vomiting  Goal: Absence of nausea  Outcome: Met  Patient free from vomitting. Patient had one episode of nausea and got zofran IV for it. Nausea resolved for rest of day.     Problem: Alteration in Blood Glucose  Goal: Glucose level within specified parameters  Outcome: Met  Patient has normal glucose throughout shift. Patient didn't need coverage on shift and got changed from NPO to carb limited diet. Tolerating food and liquids.     Problem: Breathing Pattern - Ineffective  Goal: Respiratory rate, rhythm and depth return to baseline  Outcome: Met  Patient maintained RR between 18-20, rhythm normal and depth even/unlabored.

## 2014-10-11 NOTE — Discharge Summary (Signed)
Date of Admission:  10/09/2014  Date of Discharge:  10/11/2014    Patient Name:  Cathy Lucas    Principal Diagnosis (required):  Abdominal Pain    Hospital Problem List (required):  Active Hospital Problems    Diagnosis    Abdominal pain [R10.9]    Liver mass [R16.0]    Hypertension [I10]    Hypothyroidism [E03.9]    Fibromyalgia [M79.7]      Resolved Hospital Problems    Diagnosis   No resolved problems to display.       Consultations Obtained During This Hospitalization:  Hepatology    Key consultant recommendations:  -MRI liver multiphase with gadovist to fully characterize the lesion. If she cannot get this today, ok to get as outpt. Pt also requesting premeds for contrast allergy (hives)  -pls obtain images of liver from [facility] (had CT done in 2013 at time of CCY)  -has been evaluted by our heptobiliary team. If cyst remains symptomatic, can consider fenestrated intraab drain. However, would try to avoid surgery.    Reason for Admission to the Hospital / History of Present Illness:  Per admitting H&P - Cathy Lucas is a 64 year old female with a PMH of DM, HTN, HLD, initially presenting at [facility] presenting with abdominal pain, found to have large liver mass on CT and MRI, and transferred here for higher level of care.    Started with mid abdominal/epigastric pain evening prior to admission at [facility] on 8/18, bilious, non-bloody vomiting started today. Pain radiated upward then to RUQ, wrapping around to back. Now only pain in RUQ wrapping around to back.    Hospital Course by Problem (required):      Tests Outstanding at Discharge Requiring Follow Up:  None    Discharge Condition (required):  Stable.    Key Physical Exam Findings at Discharge:  AAOx3  Eomi/perla   cta b/l s1s2 rrr  abd soft nt nd +bs  Anxious     Discharge Diet:  Diabetic / low-carbohydrate.    Discharge Medications:     What To Do With Your Medications      CHANGE how you take these medications       Add'l Info    diazepam 5 MG  tablet   Commonly known as:  VALIUM   Take 5 mg by mouth 2 times daily.    Refills:  0   What changed:  Another medication with the same name was removed. Continue taking this medication, and follow the directions you see here.       levothyroxine 100 MCG tablet   Commonly known as:  SYNTHROID   Take 112 mcg by mouth daily.    Refills:  0   What changed:  Another medication with the same name was removed. Continue taking this medication, and follow the directions you see here.       PERCOCET 5-325 MG tablet   Take 1 tablet by mouth every 6 hours as needed.   Generic drug:  oxyCODONE-acetaminophen    Refills:  0   What changed:  Another medication with the same name was removed. Continue taking this medication, and follow the directions you see here.         CONTINUE taking these medications       Add'l Info    amitriptyline 25 MG tablet   Commonly known as:  ELAVIL   Take 25 mg by mouth nightly.    Refills:  0  aspirin 81 MG tablet   Take 81 mg by mouth daily.    Refills:  0       DORZOLAMIDE HCL OP    Refills:  0       DULoxetine 30 MG capsule   Commonly known as:  CYMBALTA   Take 30 mg by mouth daily.    Refills:  0       glipiZIDE 5 MG tablet   Commonly known as:  GLUCOTROL   Take 5 mg by mouth 2 times daily.    Refills:  0       GLUCAGON EMERGENCY 1 MG injection   1 mg by IntraMUSCULAR route as needed.   Generic drug:  glucagon    Refills:  0       LATANOPROST OP    Refills:  0       LOSARTAN POTASSIUM PO   Take 25 mg by mouth daily.    Refills:  0       lovastatin 20 MG tablet   Commonly known as:  MEVACOR   Take 20 mg by mouth every evening.    Refills:  0       melatonin 5 MG tablet   Take 10 mg by mouth at bedtime.    Refills:  0       methocarbamol 500 MG tablet   Commonly known as:  ROBAXIN   Take 500 mg by mouth 1 hour after dinner.    Refills:  0       metoprolol succinate 100 MG XL tablet   Commonly known as:  TOPROL XL   Take 100 mg by mouth daily.    Refills:  0       pantoprazole 40 MG tablet      Commonly known as:  PROTONIX   Take 40 mg by mouth daily.    Refills:  0       simvastatin 20 MG tablet   Commonly known as:  ZOCOR   Take 20 mg by mouth every evening.    Refills:  0       topiramate 50 MG tablet   Commonly known as:  TOPAMAX   Take 50 mg by mouth at bedtime.    Refills:  0       TYLENOL 325 MG tablet   Take 650 mg by mouth every 4 hours as needed.   Generic drug:  acetaminophen    Refills:  0       VITAMIN D PO    Refills:  0       zolpidem 5 MG tablet   Commonly known as:  AMBIEN   Take 5 mg by mouth nightly as needed for Insomnia.    Refills:  0         STOP taking these medications          ibuprofen 600 MG tablet   Commonly known as:  MOTRIN       lisinopril 20 MG tablet   Commonly known as:  PRINIVIL, ZESTRIL       propranolol 20 MG tablet   Commonly known as:  INDERAL             Allergies:  Allergies   Allergen Reactions    Ceftriaxone Rash    Iodine Rash    Metformin Rash    Macrobid [Nitrofurantoin] Unspecified       Discharge Disposition:  Home.    Discharge Code Status:  Full code /  full care  This code status is not changed from the time of admission.    Follow Up Appointments:    Scheduled appointments:  Future Appointments  Date Time Provider [place]   10/28/2014 9:00 AM Ladon Applebaum, [place] [facility] [place]       For appointments requested for after discharge that have not yet been scheduled, refer to the Post Discharge Referrals section of the After Visit Summary.    Discharging 68 Contact Information:  [facility] phone triage at [phone].

## 2014-10-11 NOTE — Plan of Care (Signed)
Problem: Falls, Risk of  Goal: Keep patient free from falls utilizing universal fall precautions  Outcome: Met  Pt is at high risk for falls per fall risk assessment tool.  Call bell and personal belongings within reach.  Bed in lowest position and bed wheels locked. Bed alarm is on.  Pt oriented to environment and demonstrates use of the call bell.  Pt rounded on frequently and needs responded to promptly.  No falls noted.    Goal: Ensure safe mobility of patient (Mobility)  Outcome: Met  One person assist provided when pt ambulating.  Non-skid footwear is on.     Problem: Pain - Acute  Goal: Communication of presence of pain  Outcome: Met  Pain assessed with each vital sign measurement and as needed throughout shift.  Pt able to self report pain using numeric pain scale.  Endorses upper abdominal pain and chest pain 7-10/10.  Pt states chest pain is not new and is not different than the pain she has already been having.  Medicated with morphine 4 mg IV PRN.  Pain reassessed per protocol.  Please see eMar and doc flow sheet for more information.  Will continue to monitor.   Goal: Control of acute pain  Outcome: Met    Problem: Nausea/Vomiting  Goal: Absence of nausea  Outcome: Not Met  Pt continues to endorse nausea with no episodes of emesis.  Medicated with Zofran and Reglan as needed.  Pt remains NPO with continuous NS infusing at 100 mL/hr.      Problem: Alteration in Blood Glucose  Goal: Glucose level within specified parameters  Outcome: Met  Finger stick blood glucose assessed every 6 hours. No s/s of hypo/hyper glycemia noted.  Goal ongoing.  Will continue to monitor.     Problem: Breathing Pattern - Ineffective  Goal: Respiratory rate, rhythm and depth return to baseline  Outcome: Not Met  Pt noted to desat to 87% while on room air overnight.  Pt had just ambulated to bathroom and appeared short of breath.  Also endorsed severe pain to abdomen making it difficult to take deep breaths.  Oxygen saturation  did not return to baseline after several minutes so placed on supplemental oxygen at 2 L via NC.  MD notified.  Oxygen saturation improved to 96% on NC.  Chest xray completed, results pending.

## 2014-10-12 LAB — HEPATITIS B SURFACE AG, BLOOD: HBsAg: NONREACTIVE

## 2014-10-15 LAB — BLOOD CULTURE
Blood Culture Result: NO GROWTH
Blood Culture Result: NO GROWTH

## 2014-10-28 ENCOUNTER — Encounter (INDEPENDENT_AMBULATORY_CARE_PROVIDER_SITE_OTHER): Payer: Medicaid Other | Admitting: Medical

## 2014-11-10 ENCOUNTER — Encounter (INDEPENDENT_AMBULATORY_CARE_PROVIDER_SITE_OTHER): Payer: Medicaid Other | Admitting: Medical

## 2014-11-12 ENCOUNTER — Ambulatory Visit (INDEPENDENT_AMBULATORY_CARE_PROVIDER_SITE_OTHER): Payer: Medicaid Other | Admitting: Medical

## 2014-11-12 VITALS — BP 118/77 | HR 87 | Ht 65.0 in | Wt 191.8 lb

## 2014-11-12 DIAGNOSIS — G250 Essential tremor: Secondary | ICD-10-CM

## 2014-11-12 DIAGNOSIS — G44209 Tension-type headache, unspecified, not intractable: Secondary | ICD-10-CM

## 2014-11-12 MED ORDER — STOPAIN EX
Freq: Two times a day (BID) | CUTANEOUS | Status: DC
Start: ? — End: 2016-08-30

## 2014-11-12 MED ORDER — MELATONIN 1 MG OR TABS
1.00 mg | ORAL_TABLET | Freq: Every evening | ORAL | Status: DC
Start: ? — End: 2016-08-30

## 2014-11-12 MED ORDER — LOVASTATIN 10 MG OR TABS
10.00 mg | ORAL_TABLET | Freq: Every evening | ORAL | Status: DC
Start: ? — End: 2016-08-30

## 2014-11-12 MED ORDER — METOPROLOL SUCCINATE 50 MG OR TB24
50.00 mg | ORAL_TABLET | Freq: Every day | ORAL | Status: DC
Start: ? — End: 2016-08-30

## 2014-11-12 MED ORDER — CAPSAICIN-MENTHOL 0.025-1.25 % EX PTCH
MEDICATED_PATCH | CUTANEOUS | Status: DC | PRN
Start: ? — End: 2016-08-30

## 2014-11-12 MED ORDER — TIMOLOL 0.5 % OP SOLN
1.00 [drp] | Freq: Two times a day (BID) | OPHTHALMIC | Status: DC
Start: ? — End: 2016-08-30

## 2014-11-12 NOTE — Progress Notes (Addendum)
CC: headaches    HPI: The patient is here for follow up regarding headaches. She was started on Topamax, but she stopped due to cognitive side effects. Her headaches have improved significantly. Headaches are occurring about once per week. Mild in severity. Typically frontal and the top of the head. Described as dull. No photophobia or phonophobia. She has been having nausea and vomiting related to a liver mass withi pancreatitis for which she was hospitalized last month at [facility]. Headaches usually last about 30 minutes and then subside. She does not need to take anything for abortive treatment. Seem to be triggered by stress or low blood sugar.     Tremor has improved with the reduction in the Cymbalta dosage. Most prominent with holding objects or moving her arms. It sometimes can interfere with tasks such as putting make up on or when she is eating, which can be embarrassing. She is on metoprolol ER 50 mg daily for HTN and palpitations.      11/12/14  0917   BP: 118/77   Pulse: 87     Physical Exam   Constitutional: She is oriented to person, place, and time. She appears well-developed and well-nourished. No distress.   HENT:   Head: Normocephalic and atraumatic.   Right Ear: External ear normal.   Left Ear: External ear normal.   Eyes: EOM are normal. No scleral icterus.   Neck: No tracheal deviation present.   Cardiovascular: Normal rate.    Pulmonary/Chest: Effort normal.   Neurological: She is alert and oriented to person, place, and time. She exhibits normal muscle tone.   No bradykinesia with finger or toe taps.   Very mild actionl tremor of the hands with finger-nose testing.  Heel-shin testing is normal.    Skin: Skin is warm and dry. No rash noted.   Psychiatric: Her speech is normal and behavior is normal. Her mood appears anxious.     Assessment/Plan:    1) Tension type headache - the patient now describes headache consistent with episodic tension headaches. Her migraine headaches seem to have  resolved. She did not tolerate Topamax due to cognitive side effects. Currently, headaches are not problematic and do not require treatment.    2) Esstential tremor - improved at lower dosage of Cymbalta. She is already on a beta blocker. Could consider primidone, however we both agreed it is reasonable to address her liver/pancreas issues before adding on another medication as her tremor is mild. I reassured her that she does not exhibit typical Parkinsonian features.    --Follow up with GI regarding liver mass  --Monitor tremor symptoms for now. Consider starting treatment in the future if needed.  --Follow up 3 months    This was a visit of 25 minutes. Greater than 50% of the time was spent counseling the patient face-to-face regarding disease management.    Electronically signed by: Jeanelle Malling, PA on 11/12/2014, 9:52 AM     Encounter submitted for review by Jeanelle Malling, PA on 11/12/2014, 9:52 AM          Visit details reviewed and approved by supervising provider Crissie Figures, MD on 11/13/2014, 7:53 AM    Electronically signed by: Crissie Figures, MD on 11/13/2014, 7:53 AM

## 2014-11-12 NOTE — Patient Instructions (Signed)
1) Follow up with GI regarding liver mass  2) Monitor tremor symptoms for now. Consider starting treatment in the future if needed.  3) Follow up 3 months.

## 2015-02-11 ENCOUNTER — Encounter (INDEPENDENT_AMBULATORY_CARE_PROVIDER_SITE_OTHER): Payer: Medicaid Other | Admitting: Medical

## 2015-03-11 HISTORY — PX: ESOPHAGOGASTRODUODENOSCOPY: SHX1529

## 2015-04-27 ENCOUNTER — Ambulatory Visit (INDEPENDENT_AMBULATORY_CARE_PROVIDER_SITE_OTHER): Payer: Medicaid Other | Admitting: Medical

## 2015-04-27 ENCOUNTER — Encounter (INDEPENDENT_AMBULATORY_CARE_PROVIDER_SITE_OTHER): Payer: Self-pay | Admitting: Medical

## 2015-04-27 VITALS — BP 138/89 | HR 89 | Ht 65.5 in | Wt 191.0 lb

## 2015-04-27 DIAGNOSIS — G43709 Chronic migraine without aura, not intractable, without status migrainosus: Secondary | ICD-10-CM

## 2015-04-27 DIAGNOSIS — G250 Essential tremor: Secondary | ICD-10-CM

## 2015-04-27 DIAGNOSIS — IMO0002 Reserved for concepts with insufficient information to code with codable children: Secondary | ICD-10-CM

## 2015-04-27 NOTE — Patient Instructions (Addendum)
1) Request recent CRP level from Dr. Sheryn Bison office  2) Botox requested. Schedule with Dr. Bluford Kaufmann once approved.    Things You Can Do without a Prescription to help your Migraine Headaches      Food triggers -Most important trigger is any food/drink, which contains artificial  sweeteners, like DIET drinks.  MSG (monosodium glutamate - often in [place] food and salad dressing), aspartame (NutraSweet), red wine, processed foods containing nitrites, are common migraine triggers. Although these are the most common triggers, but each person may find individual trigger food. So basically, if any food /drink gives you a headache, don't eat/drink it!  Eat regular, small, healthy meals.    Get regular light exercise - studies show that regular cardiovascular exercise (5 days a week, 30 minutes a day) does an excellent job at preventing headaches sometimes even better than medications. Avoid overexertion & start gradually - even a daily walk helps.  Work on daily relaxation, deep breathing & good posture also. Yoga and meditation also have been shown to be very effective.     Drink plenty of fluids - Best fluid is plain water, avoid flavored water. About one-half to one gallon of water a day - this helps prevent headaches, and helps back and joint pain as well.  If you have a headache, drinking a big glass of water can help it get better.     Get regular sleep - sleep deprivation can contribute to chronic headaches. On the other hand oversleeping can trigger the headache. Patient with chronic headache should have regular sleep schedule If you snore, talk to your doctor - Obstructive Sleep Apnea is a common cause of daily headaches.    Caffeine -Although small amount of caffeine won't make the headache worse and even caffeine may help a headache if used infrequently, but studies show that regular caffeine use results in an increased number of headaches for many people. In general excessive use or fluctuation in caffeine intake is  a clear triggering factor for headache, this includes coffee, tea, caffeinated pop. Don't forget caffeine containing medicines like Excedrin or Fioricet has A LOT of caffeine.    Smoking- Smoking is a common headache trigger & cigarette smoke contains  chemicals that can make pain worse.  Besides headaches, smoking causes cancer, strokes & heart disease.  It makes arthritis and low back pain worse.    Smell- Strong perfumes / chemical smells can trigger migraines - beware of chemically scented air fresheners and candles.    Don't overuse your pain medicines - if you are using ANY "over-the-counter" pain medicine more than 3 days in a row or 15 days a month, or any prescription medication more than 9 days per month, there is a chance it could be causing rebound headaches. This is particularly true for narcotic drugs (like Tramadol, Vicodin, Percocet , Darvocet, Oxycontin,.)and barbiturate-caffeine containing medication (Fioricet, Fiorinal, Esgic,.).

## 2015-04-27 NOTE — Progress Notes (Addendum)
CC: headaches    HPI: The patient is here for follow up regarding headaches. She was last seen in September. She had been on Topamax, but she stopped due to cognitive side effects. She remains on Cymbalta for mood. Described as pressure. Associated with photophobia, phonophobia, nausea, and vomiting. Frequency is daily. She had an MRI in 2012 that was reportedly normal. Headaches started after she had heart surgery in 2012. No change in headache character other than more nausea and vomiting - but she has been told this is related to her liver issues. She had recent blood work including CBC, TSH, and ESR. The TSH is high. She states that her dose of levothyroxine was not adjusted.     She underwent an endoscopy and a hiatal hernia was found. Additionally, there was narrowing of the esophagus. She is still undergoing diagnostic work up for a liver mass. She is avoiding Tylenol.    Tremor has improved with the reduction in the Cymbalta dosage, but she still has the tremors on a daily basis. She notices them most at night. She also notes leg cramps for which her PCP suggested that she drink quinine. She notices it with holding objects or moving her arms. It sometimes can interfere with tasks such as writing, putting make up on, or occasionally when she is eating. She was switched to carvedilol by Dr. Lafayette Dragon for HTN and palpitations.     Patient Active Problem List   Diagnosis   . Fibromyalgia   . Abdominal pain   . Liver mass   . Hypertension   . Hypothyroidism       Current Outpatient Prescriptions:   .  acetaminophen (TYLENOL) 325 MG tablet, Take 650 mg by mouth every 4 hours as needed., Disp: , Rfl:   .  amitriptyline (ELAVIL) 25 MG tablet, Take 25 mg by mouth nightly., Disp: , Rfl:   .  aspirin 81 MG tablet, Take 81 mg by mouth daily., Disp: , Rfl:   .  Capsaicin-Menthol (SALONPAS GEL) 0.025-1.25 % PTCH, as needed., Disp: , Rfl:   .  Cholecalciferol (VITAMIN D PO), Take 50,000 capsules by mouth once a week.  , Disp: ,  Rfl:   .  diazepam (VALIUM) 5 MG tablet, Take 5 mg by mouth 2 times daily., Disp: , Rfl:   .  DULoxetine (CYMBALTA) 30 MG capsule, Take 30 mg by mouth daily.  , Disp: , Rfl:   .  glipiZIDE (GLUCOTROL) 5 MG tablet, Take 5 mg by mouth 2 times daily., Disp: , Rfl:   .  LATANOPROST OP, , Disp: , Rfl:   .  levothyroxine (SYNTHROID) 100 MCG tablet, Take 112 mcg by mouth daily.  , Disp: , Rfl:   .  LOSARTAN POTASSIUM PO, Take 25 mg by mouth daily.  , Disp: , Rfl:   .  lovastatin (MEVACOR) 10 MG tablet, Take 10 mg by mouth every evening., Disp: , Rfl:   .  melatonin 1 MG tablet, Take 1 mg by mouth at bedtime., Disp: , Rfl:   .  Menthol, Topical Analgesic, (STOPAIN EX), 2 times daily. 1-2 applications apply on the skin twice a day; 1 spray prn, Disp: , Rfl:   .  metoprolol succinate (TOPROL XL) 50 MG XL tablet, Take 50 mg by mouth daily., Disp: , Rfl:   .  oxyCODONE-acetaminophen (PERCOCET) 5-325 MG per tablet, Take 1 tablet by mouth every 6 hours as needed., Disp: , Rfl:   .  pantoprazole (PROTONIX) 40 MG  tablet, Take 40 mg by mouth daily., Disp: , Rfl:   .  timolol (BETIMOL) 0.5 % ophthalmic solution, Place 1 drop into both eyes 2 times daily. use in affected eye(s), Disp: , Rfl:   .  zolpidem (AMBIEN) 5 MG tablet, Take 5 mg by mouth nightly as needed for Insomnia., Disp: , Rfl:     Social History   Substance Use Topics   . Smoking status: Never Smoker   . Smokeless tobacco: Not on file   . Alcohol use No          04/27/15  1411   BP: 138/89   Pulse: 89     Physical Exam   Constitutional: She is oriented to person, place, and time. She appears well-developed and well-nourished. No distress.   HENT:   Head: Normocephalic and atraumatic.   Right Ear: External ear normal.   Left Ear: External ear normal.   Eyes: EOM are normal. No scleral icterus.   Neck: No tracheal deviation present.   Cardiovascular: Normal rate.    Pulmonary/Chest: Effort normal.   Neurological: She is alert and oriented to person, place, and time. She  exhibits normal muscle tone.   No bradykinesia with finger taps.   Minimal action tremor of the hands with finger-nose testing.  No shuffling or freezing.      Skin: Skin is warm and dry. No rash noted.   Psychiatric: Her speech is normal and behavior is normal. Her mood appears anxious.     Encounter Diagnoses   Name Primary?   . Chronic migraine Yes   . Essential tremor      The patient's headache history is most consistent with that of chronic migraine. She had intolerable side effects with Topamax, and she is currently on Carvedilol and Cymbalta with persistent daily headache. Discussed treatment options, and I recommended Botox for prophylaxis. Side effects and expectations discussed. She had a normal MRI several years ago and per her report, there are no change in headache characteristics. She did have labs done in the last few months that showed a normal ESR, but an elevated TSH. Hypothyroidism can exacerbate headaches. She has an appointment with her primary care provider this week and will discuss it then.     In terms of her tremor, I still think her tremor is essential tremor. I again reassured her that she does not have any Parkinsonian features on exam. We discussed adding mysoline, but with her liver issues and the fact that her symptoms are mild, we will hold off and continue to monitor.    1) Request recent CRP level from Dr. Sheryn Bison office  2) Botox requested. Schedule with Dr. Bluford Kaufmann once approved.    This was a visit of 40 minutes. Greater than 50% of the time was spent counseling the patient face-to-face regarding disease management.    Electronically signed by: Jeanelle Malling, PA on 04/27/2015, 3:19 PM     Encounter submitted for review by Jeanelle Malling, PA on 04/27/2015, 3:19 PM      Visit details reviewed and approved by supervising provider Crissie Figures, MD on 04/27/2015, 4:02 PM    Electronically signed by: Crissie Figures, MD on 04/27/2015, 4:02 PM

## 2015-05-22 HISTORY — PX: LIVER BIOPSY: SHX301

## 2015-08-24 LAB — COMPREHENSIVE METABOLIC PANEL, BLOOD
A/G Ratio: 1.3 (ref 1.2–2.2)
ALT (SGPT): 11 IU/L (ref 0–32)
AST: 19 IU/L (ref 0–40)
Albumin: 4.1 g/dL (ref 3.5–5.5)
Alkaline Phos: 43 IU/L (ref 39–117)
BUN/Creatinine Ratio: 12 (ref 9–23)
BUN: 8 mg/dL (ref 6–24)
Bilirubin, Total: 0.8 mg/dL (ref 0.0–1.2)
Calcium: 9 mg/dL (ref 8.7–10.2)
Carbon Dioxide: 22 mmol/L (ref 18–29)
Chloride: 99 mmol/L (ref 96–106)
Creatinine: 0.67 mg/dL (ref 0.57–1.00)
Globulin, Total: 3.1 g/dL (ref 1.5–4.5)
Glucose: 85 mg/dL (ref 65–99)
Potassium: 4.3 mmol/L (ref 3.5–5.2)
Protein, Total, Serum: 7.2 g/dL (ref 6.0–8.5)
Sodium: 137 mmol/L (ref 134–144)
eGFR If Africn Am: 126 mL/min/{1.73_m2} (ref >59)
eGFR If NonAfricn Am: 110 mL/min/{1.73_m2} (ref >59)

## 2015-08-24 LAB — SED RATE, BLOOD: Sedimentation Rate-Westergren: 2 mm/hr (ref 0–32)

## 2015-08-24 LAB — TSH, BLOOD: TSH: 3.61 u[IU]/mL (ref 0.450–4.500)

## 2015-08-24 LAB — C-REACTIVE PROTEIN, BLOOD: C-Reactive Protein, Quant: <0.3 mg/L (ref 0.0–4.9)

## 2015-08-24 LAB — CMP14 AMBIGUOUS ABBREVIATION DEFAULT-LABCORP

## 2015-09-14 ENCOUNTER — Ambulatory Visit (INDEPENDENT_AMBULATORY_CARE_PROVIDER_SITE_OTHER): Payer: Medicaid Other | Admitting: Neurology

## 2015-09-16 ENCOUNTER — Ambulatory Visit (INDEPENDENT_AMBULATORY_CARE_PROVIDER_SITE_OTHER): Payer: Self-pay | Admitting: Neurology

## 2015-09-16 DIAGNOSIS — G43709 Chronic migraine without aura, not intractable, without status migrainosus: Secondary | ICD-10-CM

## 2015-10-29 LAB — COMPREHENSIVE METABOLIC PANEL, BLOOD
ALT (SGPT): 12
AST (SGOT): 18
Albumin/Globulin Ratio: 1.5
Albumin: 4.5
Alkaline Phos: 94
Amylase: 62
BUN: 11
Bicarbonate: 22
Bilirubin, Total: 0.4
Calcium: 9.9
Chloride: 102
Creatinine, Urine: 281
Creatinine: 0.71
Globulin: 3.1
Glucose: 119
Lipase, Serum: 9
Microalb/Creat Ratio: 2.5
Potassium: 4.4
Sodium: 139
Total Protein: 7.6
eGFR African American: 106
eGFR non-Afr.American: 91

## 2015-11-02 LAB — CBC WITH DIFF, BLOOD
Basophils: 0.8
Eosinophils: 2.5
Hct: 41.7
Hemoglobin A1c: 5.9
Hgb: 14
Lymphocytes: 21.9
MCH: 29.4
MCHC: 33.6
MCV: 87.6
Monocytes: 7.5
Plt Count: 330
RBC: 4.76
RDW: 13.6
Segs: 67.3
WBC: 7.6

## 2015-12-14 ENCOUNTER — Telehealth (HOSPITAL_BASED_OUTPATIENT_CLINIC_OR_DEPARTMENT_OTHER): Payer: Self-pay

## 2015-12-14 DIAGNOSIS — R160 Hepatomegaly, not elsewhere classified: Secondary | ICD-10-CM

## 2015-12-14 NOTE — Telephone Encounter (Signed)
External referral- patient with cystic lesion on R lobe of the liver measuring: 3.9 X 6.1 X 6.5 cm.     Patient called, registered, she is aware referral under review. Patient only wants to be seen in [facility] she does not want to come to [place] if this is not accommodated.  Please advise. Records are scanned in media.

## 2015-12-16 ENCOUNTER — Encounter (HOSPITAL_BASED_OUTPATIENT_CLINIC_OR_DEPARTMENT_OTHER): Payer: Self-pay

## 2015-12-16 NOTE — Telephone Encounter (Signed)
65 y/o female referred by PCP for eval of liver lesion stable in size since 2013. Hx of DM, CAD.    Please offer patient and appointment 12/22/15 with Dr. Marykay Lex at [facility]. Please obtain imaging if possible.    11/25/15 MRI Abd W/WO Contrast ([facility]): Stable appearence of a cystic lesion in the right lobe of the liver near the hepatic dome measuring 3.9 x 6.1 6.5 cm. 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 However, a hemorrhagic cyst remains in the differential . No aggressive/malignant features are noted. However, continued follow-up is recommended to assess stability.  Stable appearence of several additional small simple cysts within the liver.   Previous cholecystectomy.    11/24/14 MRI Abd W/WO Contrast ([facility]): A well-circumscribed T1 hyperintense cystic lesion in the right hepatic lobe measures 7.6 x 7.7 cm (20-10) with nonenhancing nodularity seen inferiorly and with a single internal septation. This lesion is similar in size and appearance to the MRI examination perfolrmed on 11/09/2011. Similar smaller cystic lesions are seen at the periphery of hepatic segments six and seven. All of these lesions are unchanged from prior.     10/12/11 [place] Abd ([facility]): Hepatic steatosis. Septated right hepatic cyst. Hypervascular possibly solid lesion in the right lobe. Depending on clinical considerations, further evaluation with pre-and post contrast enhanced MR and CT may be of benefit for further evaluation of these lesions. Cholecystectomy.      05/2015 Liver Bx: Steatohepatitis with periportal and focal septal fibrosis(stage 2-3 of 4). Small focus of benign epithelial lining suggestive of benign cyst, with adjacent chronic inflammation, reactive fibrosis, and hemosiderin depostition B. Liver asparte fluid: hypocellular specimen. Rare benign hepatocysts and macrophages. No malignant cells identified.

## 2016-01-07 NOTE — Telephone Encounter (Signed)
Patient is being referred to Hepatology for Hepatomegaly, not elsewhere classified     Referring Provider: [facility]      Internal or External Referral: External         If external referral: were referral & notes scanned Pending (Request imaging if pt. Had any done)    Outcome of call: Left message.

## 2016-01-11 NOTE — Telephone Encounter (Signed)
LM to try and get pt. Scheduled per referral we received. (Missing Labs but ok to schedule per T/C)

## 2016-01-18 ENCOUNTER — Telehealth (HOSPITAL_BASED_OUTPATIENT_CLINIC_OR_DEPARTMENT_OTHER): Payer: Self-pay

## 2016-01-18 NOTE — Telephone Encounter (Signed)
Per previous telephone encounter:    "65 y/o female referred by PCP for eval of liver lesion stable in size since 2013. Hx of DM, CAD.    Please offer patient and appointment 12/22/15 with Dr. Marykay Lex at [facility]. Please obtain imaging if possible."    Patient called back, is there another date and time we can offer to pt?

## 2016-01-19 NOTE — Telephone Encounter (Signed)
I believe patient prefers [facility] location.  Dr. Blanche East has opening 01/21/16 at 0800 or next avail with Dr. Marykay Lex according to patient's preference.

## 2016-01-21 NOTE — Telephone Encounter (Signed)
Called patient back to schedule consult. L/m to call back to schedule. Please warm transfer pt to [place].

## 2016-05-24 ENCOUNTER — Telehealth (HOSPITAL_BASED_OUTPATIENT_CLINIC_OR_DEPARTMENT_OTHER): Payer: Self-pay

## 2016-05-24 NOTE — Telephone Encounter (Signed)
Pt called to have [place] request liver biopsy report from [place].    I s/w Izora Gala who requested a cover page with request. No release signed by pt needed.    Faxed to [phone]

## 2016-06-14 ENCOUNTER — Encounter (INDEPENDENT_AMBULATORY_CARE_PROVIDER_SITE_OTHER): Payer: Medicaid Other | Admitting: Hepatology

## 2016-06-14 NOTE — Progress Notes (Unsigned)
[facility]  ATTENDING: Marlowe Kays MD  DATE OF SERVICE: 06/14/2016      CHIEF COMPLAINT:   No chief complaint on file.      REASON FOR VISIT:  [facility] is a 66 yo hispanic female with a past medical history of DM, HTN, HLD, and chronic pain, who presents to [place] for evaluation of a liver mass    HISTORY OF LIVER DISEASE:    [facility] was previously transferred to [place] in August of 2016 after presenting to [place] with complaints of abdominal pain and large 7.6cm  liver lesion (cyst) was identified on imaging.  She was evaluated by inpatient hepatology at that time who noted that the patient had been told that she had a cyst over 10 year prior (identified incidentally).  Recommend MRI which was performed at an outside hospital shortly after her hospital stay.   She subsequently has had a biopsy of the liver lesion (surrounding liver and apparently the edge of the cyst) as well as cyst aspirate, with findings revealing steatohepatitis with stage 2-3 fibrosis and a benign liver cyst.       REVIEW OF SYSTEMS:    Constitutional (e.g., fever, weight loss): Normal  Eyes: Normal  Ears, Nose, Mouth, Throat: Normal  Cardiovascular: Normal  Respiratory: Normal  Gastrointestinal: See below  Genitourinary: Normal  Musculoskeletal:  Normal  Integumentary (skin and/or breast): Normal  Neurological: Normal  Psychiatric: Normal  Endocrine: Normal  Hematologic/Lymphatic: Normal  Allergic/Immunologic: Normal    LIVER SPECIFIC REVIEW OF SYMPTOMS:  Confusion: No  Abdominal distention: No  Melena: No  Hematemesis: No  Hematochezia: No  Lower extremity: No  All other systems are negative.    PAST MEDICAL HISTORY  Past Medical History:   Diagnosis Date    Back pain     Needs L4 fusion    Depression     Diabetes (CMS-HCC)     Fibromyalgia     GERD (gastroesophageal reflux disease)     Glaucoma     Hypercholesterolemia     Hypertension     Hypothyroidism     Migraines        PAST SURGICAL HISTORY  Past  Surgical History:   Procedure Laterality Date    BILE DUCT STENT PLACEMENT      CHOLECYSTECTOMY      COLONOSCOPY  05/31/2012    normal mucosa with internal hemorrhoids.     ESOPHAGOGASTRODUODENOSCOPY  03/11/2015    HYSTERECTOMY      LIVER BIOPSY  05/22/2015    steatohepatitis with periportal and focal septal fibrosis(stage 2-3 of 4). Small focus of benign epithelial lining suggestive of benign cyst, with adjacent chronic inflammation, reactive fibrosis, and hemosiderin depostition B. Liver asparte fluid: hypocellular specimen. Rare benign hepatocysts and macrophages. No malignant cells identified.    MITRAL VALVE REPAIR      TONSILLECTOMY AND ADENOIDECTOMY         MEDICATIONS    No outpatient prescriptions have been marked as taking for the 06/14/16 encounter (Appointment) with Demetrios Isaacs, MD.       ALLERGIES:   Allergies   Allergen Reactions    Ceftriaxone Rash    Iodine Rash    Metformin Rash    Macrobid [Nitrofurantoin] Unspecified       FAMILY HISTORY: ***    SOCIAL HISTORY:  Living Situation:  Occupation:  Marital Status:  Tobacco:  Alcohol:  Illicit Drugs:    PHYSICAL EXAM:  There were no vitals taken for  this visit.  General:  Alert and oriented x 3, conversant, pleasant and appropriate.  Eyes: No jaundice.  ENT: Oropharynx is clear, mucus membranes are moist.   Pulmonary:  Clear to auscultation bilaterally.  Cardiovascular: Regular rate and rhythm. No murmurs, rubs, gallops.  GI: Abdomen soft, non-tender, non-distended.  Extremities: No lower extremity edema.  Neurological:  No asterixis.  Skin: No rashes.  Psych: normal affect  Heme: no bruising    LABS  Labs were reviewed in EPIC    Lab Results   Component Value Date    WBC 10.1 (H) 10/11/2014    HGB 10.4 (L) 10/11/2014    HCT 31.6 (L) 10/11/2014    PLT 205 10/11/2014    MCHC 32.9 10/11/2014    RDW 14.8 (H) 10/11/2014    MPV 10.1 10/11/2014     Lab Results   Component Value Date    NA 138 10/10/2014    K 4.1 10/10/2014    CL 101 10/10/2014      BICARB 22 10/10/2014    BUN 16 10/10/2014    CREAT 0.66 10/10/2014    GLU 163 (H) 10/10/2014     Lab Results   Component Value Date    TP 6.0 10/11/2014    ALB 3.3 (L) 10/11/2014    AST 39 (H) 10/11/2014    ALT 57 (H) 10/11/2014    TBILI 0.54 10/11/2014    DBILI <0.2 10/11/2014    ALK 103 10/11/2014     Lab Results   Component Value Date    INR 1.1 10/10/2014     No results found for: AFPT, AFPL, DCP  Lab Results   Component Value Date    HEPBSURFACAG Nonreactive 10/10/2014    HEPBSURFABQT <3.5 10/10/2014     External Labs:    Lab Results   Component Value Date    TPX 7.6 10/29/2015    ALBX 4.5 10/29/2015    ASTX 18 10/29/2015    ALTX 12 10/29/2015    TBILIX 0.4 10/29/2015    ALKX 94 10/29/2015     Lab Results   Component Value Date    NAX 139 10/29/2015    KX 4.4 10/29/2015    CLX 102 10/29/2015    BICARBX 22 10/29/2015    BUNX 11 10/29/2015    CREATX 0.71 10/29/2015    GLUX 119 10/29/2015     Lab Results   Component Value Date    WBCX 7.6 11/02/2015    HGBX 14.0 11/02/2015    HCTX 41.7 11/02/2015    PLTX 330 11/02/2015     Lab Results   Component Value Date    A1C 5.9 11/02/2015         PERTINENT IMAGING STUDIES:  Imaging studies were reviewed in EPIC    ENDOSCOPY/BIOPSY REPORTS:  Endoscopy and biopsy reports were reviewed in EPIC        ASSESSMENT/PLAN:   [facility] is a 66 yo hispanic female with a past medical history of DM, HTN, HLD, and chronic pain, who presents to [place] for evaluation of a liver mass s/p biopsy revealing a benign simple cyst and liver parenchyma with steatohepatitis and stage 2-3 fibrosis.       #Simple Hepatic Cyst: Simple benign cyst per OSH biopsy and fluid aspirate.     #Steatohepatitis:  Her most recent liver enzymes (10/2015) have improved. Her A1c appears improved as well in comparison to prior outside notes.      #Health Maintenance:  --Immunizations: Hep  A,B  --CRC Screening:   Disposition:        This patient was seen and discussed with Dr. Tia Masker vodkin    Ardeen Garland MD  Transplant Hepatology Fellow  [phone]  06/14/2016

## 2016-08-30 ENCOUNTER — Other Ambulatory Visit (INDEPENDENT_AMBULATORY_CARE_PROVIDER_SITE_OTHER): Payer: Medicaid Other | Attending: Hepatology

## 2016-08-30 ENCOUNTER — Ambulatory Visit (INDEPENDENT_AMBULATORY_CARE_PROVIDER_SITE_OTHER): Payer: Medicaid Other | Admitting: Hepatology

## 2016-08-30 ENCOUNTER — Encounter (INDEPENDENT_AMBULATORY_CARE_PROVIDER_SITE_OTHER): Payer: Self-pay | Admitting: Hepatology

## 2016-08-30 VITALS — BP 119/68 | HR 73 | Temp 97.8°F | Resp 16 | Ht 65.5 in | Wt 167.0 lb

## 2016-08-30 DIAGNOSIS — E785 Hyperlipidemia, unspecified: Secondary | ICD-10-CM

## 2016-08-30 DIAGNOSIS — R1011 Right upper quadrant pain: Secondary | ICD-10-CM | POA: Insufficient documentation

## 2016-08-30 DIAGNOSIS — Z7902 Long term (current) use of antithrombotics/antiplatelets: Secondary | ICD-10-CM

## 2016-08-30 DIAGNOSIS — R160 Hepatomegaly, not elsewhere classified: Secondary | ICD-10-CM | POA: Insufficient documentation

## 2016-08-30 DIAGNOSIS — K7581 Nonalcoholic steatohepatitis (NASH): Secondary | ICD-10-CM

## 2016-08-30 DIAGNOSIS — K7689 Other specified diseases of liver: Secondary | ICD-10-CM

## 2016-08-30 DIAGNOSIS — Z7984 Long term (current) use of oral hypoglycemic drugs: Secondary | ICD-10-CM

## 2016-08-30 DIAGNOSIS — K740 Hepatic fibrosis: Secondary | ICD-10-CM

## 2016-08-30 DIAGNOSIS — Z6827 Body mass index (BMI) 27.0-27.9, adult: Secondary | ICD-10-CM

## 2016-08-30 DIAGNOSIS — E119 Type 2 diabetes mellitus without complications: Secondary | ICD-10-CM

## 2016-08-30 DIAGNOSIS — Z952 Presence of prosthetic heart valve: Secondary | ICD-10-CM

## 2016-08-30 LAB — CBC WITH DIFF, BLOOD
ANC-Manual Mode: 5.7 10*3/uL (ref 1.6–7.0)
Abs Eosinophils: 0.4 10*3/uL (ref 0.1–0.5)
Abs Lymphs: 1.1 10*3/uL (ref 0.8–3.1)
Abs Monos: 0.5 10*3/uL (ref 0.2–0.8)
Eosinophils: 5 %
Hct: 38.2 % (ref 34.0–45.0)
Hgb: 12.5 gm/dL (ref 11.2–15.7)
Lymphocytes: 14 %
MCH: 29.3 pg (ref 26.0–32.0)
MCHC: 32.7 g/dL (ref 32.0–36.0)
MCV: 89.5 um3 (ref 79.0–95.0)
MPV: 10.4 fL (ref 9.4–12.4)
Monocytes: 7 %
Plt Count: 283 10*3/uL (ref 140–370)
RBC: 4.27 10*6/uL (ref 3.90–5.20)
RDW: 14.8 % — ABNORMAL HIGH (ref 12.0–14.0)
Segs: 71 %
WBC: 7.7 10*3/uL (ref 4.0–10.0)

## 2016-08-30 LAB — PROTHROMBIN TIME, BLOOD
INR: 1.1
PT,Patient: 12.3 s (ref 9.7–12.5)

## 2016-08-30 LAB — MDIFF
Bands: 3 % (ref 0–15)
Number of Cells Counted: 115
Plt Est: ADEQUATE
RBC Comment: NORMAL

## 2016-08-30 LAB — COMPREHENSIVE METABOLIC PANEL, BLOOD
ALT (SGPT): <5 U/L (ref 0–33)
AST (SGOT): 13 U/L (ref 0–32)
Albumin: 4.3 g/dL (ref 3.5–5.2)
Alkaline Phos: 94 U/L (ref 35–140)
Anion Gap: 13 mmol/L (ref 7–15)
BUN: 13 mg/dL (ref 8–23)
Bicarbonate: 26 mmol/L (ref 22–29)
Bilirubin, Tot: 0.5 mg/dL (ref <1.2)
Calcium: 9.6 mg/dL (ref 8.5–10.6)
Chloride: 98 mmol/L (ref 98–107)
Creatinine: 0.63 mg/dL (ref 0.51–0.95)
GFR: >60 mL/min
Glucose: 160 mg/dL — ABNORMAL HIGH (ref 70–99)
Potassium: 4 mmol/L (ref 3.5–5.1)
Sodium: 137 mmol/L (ref 136–145)
Total Protein: 7.7 g/dL (ref 6.0–8.0)

## 2016-08-30 LAB — CA 19-9, BLOOD: CA 19-9: 9 U/mL — ABNORMAL LOW (ref 30–42)

## 2016-08-30 LAB — BILIRUBIN, DIR BLOOD: Bilirubin, Dir: <0.2 mg/dL (ref <0.2)

## 2016-08-30 MED ORDER — SENNOSIDES-DOCUSATE SODIUM 8.6-50 MG OR TABS
1.00 | ORAL_TABLET | Freq: Every day | ORAL | Status: DC
Start: ? — End: 2016-12-27

## 2016-08-30 MED ORDER — CLOPIDOGREL BISULFATE 75 MG OR TABS
75.00 mg | ORAL_TABLET | Freq: Every day | ORAL | Status: DC
Start: ? — End: 2016-12-27

## 2016-08-30 MED ORDER — CARVEDILOL 25 MG OR TABS: 25.00 mg | ORAL_TABLET | Freq: Two times a day (BID) | ORAL | Status: AC

## 2016-08-30 MED ORDER — ONDANSETRON HCL 8 MG OR TABS
8.00 mg | ORAL_TABLET | Freq: Three times a day (TID) | ORAL | Status: DC | PRN
Start: ? — End: 2018-10-11

## 2016-08-30 MED ORDER — ATORVASTATIN CALCIUM 10 MG OR TABS
20.00 mg | ORAL_TABLET | Freq: Every day | ORAL | Status: DC
Start: ? — End: 2021-03-29

## 2016-08-30 NOTE — Patient Instructions (Signed)
Blood work today.     Liver MRI: Please call [facility] Scheduling at [phone] to schedule your appointment for your MRI    It was nice seeing you today.  If you have any questions or concerns please do not hesitate to call your care team    Marlowe Kays, M.D.  Assistant Professor  [facility]      My Care team    Wetzel Bjornstad NP  Hepatology Nurse Practitioner: medication questions and clinic visits when you are stable and doing well or when you need urgent follow up.     Rhys Martini, RN   Hepatology Nurse: refill medications or medical questions  Direct Line: [phone]    Call Center  Appointments, procedures or insurance issues  Phone: [phone]

## 2016-08-30 NOTE — Progress Notes (Signed)
Attending Note:  Patient interviewed and examined on 08/30/2016 , and I have reviewed the note by Dr. Ronny Flurry from 08/30/2016 .    Risk factors for liver disease:   - Viral: Known HBV and HCV negative  - History of heavy alcohol use: No  - Metabolic:      -- elevated BMI:Yes, Body mass index is 27.37 kg/(m^2).     -- HLD: Yes     -- DMII: Yes  - DILI risks: relevant herbal, supplement, OTC medications: no hx of MTX  - Autoimmune disease: Yes - RA  - Inflammatory bowel disease: No  - Family history: None    No ascites, edema, no hematemesis, hematochezia, melena, no jaundice, pruritis or acholic stools, no hepatic encephalopathy (no overt encephalopathy, no reversal of sleep/wake cycles, no memory/attention issues). Review of systems otherwise negative.       I agree w/ the fellow history, exam, assessment, and plan with the following additions:     I personally reviewed outside MRI in [facility] from 10/08/14 and my interpretation is as follows: Cyst is at the dome of the liver. Complex material in the cyst consistent with the history of bleeding.     I am requesting records [place] from [place] hospitalization.   I am [facility] to see if we can try to track down imaging of the cyst before she bled in 2016.     [facility] is a 66 yo hispanic female with a past medical history of RA. DM, HTN, HLD, as well as known hepatic cyst (dx around 2006 per notes but patient said actually late 90s, has had bleed into cyst 2016 and thinks maybe again 2017), here for follow up for liver cyst and biopsy proven NASH.     # Liver Cyst: long standing cyst. 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 However, cyst with really minimal growth last several years, simple material and I think imaging from recent MRi probably impacted by what has happened to the cyst. Would like to get imaging from prior to 2016 to see if this was  actually just a simple cyst (which is how it is behaving). Will re-imagine now to look for any further growth or morphologic changes. Will get records from [facility] from 2017 since she thinks she actually bled into the cyst again. We discussed that there really is no treatment beyond sugery and we would only consider surgery if there was concern for malignancy or she truly had recurrent bleeding. Cyst location is not ideal, patient on plavix with multiple comorbidities so would have to have significant benefit from any intervention before we would entertain the idea.     # NASH: biopsy proven NASH with fibrosis  -- will get elastography so we can follow non invasively for evolution to cirrhosis.   -- Risk modification:      1. Current BMI: Body mass index is 27.37 kg/(m^2).Marland Kitchen Goal normal weight. Her LFTs have improved with weight loss bx from 2017 so I wonder if she has already had improvement. Will see MRE and the PDFF to see if she actually still has fat.      2. HLD: management per primary care physician. No contra-indication to statins in this population.      3. Diabetes: management per primary care physician. Goal excellent glycemic control.   -- Medications: none at this time. Doubt would be candidate for  clinical trial with numerous comorbidities and she is not interested in any additional meds. I don't think she is a great candidate for pioglitazone with cardiac issues and hypoglycemia episodes. Vit E can be considered but not aimed at diabetics so would hold off for now especially if LFTs are normal as we would have nothing to follow for clinical response and vit E can increase risk of hemorrhagic stroke.

## 2016-08-30 NOTE — Progress Notes (Signed)
[place]  ATTENDING: Marlowe Kays, M.D.  DATE OF SERVICE: 08/30/2016  REQUESTING PROVIDER: Lockie Mola, MD  [facility] [place], [place] [address]    Reason for Visit: liver lesion  CHIEF COMPLAINT:   Chief Complaint   Patient presents with    New Patient       Patient Active Problem List    Diagnosis Date Noted    Abdominal pain 10/10/2014    Liver mass 10/10/2014     11/25/15 MRI Abd W/WO Contrast ([facility]): Stable appearence of a cystic lesion in the right lobe of the liver near the hepatic dome measuring 3.9 x 6.1 6.5 cm. 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 However, a hemorrhagic cyst remains in the differential . No aggressive/malignant features are noted. However, continued follow-up is recommended to assess stability.  Stable appearence of several additional small simple cysts within the liver.   Previous cholecystectomy.    11/24/14 MRI Abd W/WO Contrast ([facility]): A well-circumscribed T1 hyperintense cystic lesion in the right hepatic lobe measures 7.6 x 7.7 cm (20-10) with nonenhancing nodularity seen inferiorly and with a single internal septation. This lesion is similar in size and appearance to the MRI examination perfolrmed on 11/09/2011. Similar smaller cystic lesions are seen at the periphery of hepatic segments six and seven. All of these lesions are unchanged from prior.     10/12/11 [place] Abd ([facility]): Hepatic steatosis. Septated right hepatic cyst. Hypervascular possibly solid lesion in the right lobe. Depending on clinical considerations, further evaluation with pre-and post contrast enhanced MR and CT may be of benefit for further evaluation of these lesions. Cholecystectomy.      06/09/2015 Liver Bx [facility]): Steatohepatitis with periportal and focal septal fibrosis(stage 2-3 of 4). Small focus of benign epithelial lining suggestive of benign cyst, with adjacent chronic inflammation, reactive fibrosis, and  hemosiderin depostition B. Liver asparte fluid: hypocellular specimen. Rare benign hepatocysts and macrophages. No malignant cells identified.      Hypertension 10/10/2014    Hypothyroidism 10/10/2014    Fibromyalgia 10/08/2009       HISTORY:  This is a new consultation requested by Dr. Grandville Silos, [place]ikes for evaluation of liver cyst.     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 Imaging over the years showed that it had gradually grown but eventually stabilized in size. She was hospitalized once in 2016 for likely bleed into the liver cyst, but managed conservatively. Had another RUQ pain episode in 05/2015, received biopsy of the lesion at [facility] then, path showing a) benign cysts w/ chronic inflammatory changes and b) steatohepatitis w/ periportal and focal septal fibrosis (stage 2-3/4).     Currently, she occasionally gets sharp/pricking pain in the RUQ approximately 10x/day, but she suspects this could partly be from arthritis. She used to weigh close to 200 lbs (lost 30 lbs intentionally for T2DM), never told she has HBV/HCV, not much EtOH use. For her RA, she has never been  treated with MTX (only leflunomide and HCQ in the past). No overt signs of decompensated liver disease (longstanding insomnia w/o day/night reversal, no confusion, no hematemasis, no swelling).      Recent medical issues not necessarily related to liver disease:  She was recently diagnosed with [place] at [facility] in 07/2016 for hypoxemia thought to be  from ?anxiety (what she was told). Later, outpatient Cardiologist gave her a suspected diagnosis of PH based on Echo data, has a [place] pending at [facility]; had a V/Q scan too, unclear of the results.      REVIEW OF SYSTEMS:    Constitutional (e.g., fever, weight loss): Normal  Eyes: Normal  Ears, Nose, Mouth, Throat: Normal  Cardiovascular: Normal  Respiratory: Normal  Gastrointestinal: See below  Genitourinary: Normal  Musculoskeletal:  Normal  Integumentary (skin and/or breast): Normal  Neurological: Normal  Psychiatric: Normal  Endocrine: Normal  Hematologic/Lymphatic: Normal  Allergic/Immunologic: Normal    LIVER SPECIFIC REVIEW OF SYMPTOMS:  Confusion: No  Abdominal distention: No  Melena: No  Hematemesis: No  Hematochezia: No  Lower extremity: No  All other systems are negative.    PAST MEDICAL HISTORY  Past Medical History:   Diagnosis Date    Back pain     Needs L4 fusion    Depression     Diabetes (CMS-HCC)     Fibromyalgia     GERD (gastroesophageal reflux disease)     Glaucoma     Hypercholesterolemia     Hypertension     Hypothyroidism     Migraines        PAST SURGICAL HISTORY  Past Surgical History:   Procedure Laterality Date    BILE DUCT STENT PLACEMENT      CHOLECYSTECTOMY      COLONOSCOPY  05/31/2012    normal mucosa with internal hemorrhoids.     ESOPHAGOGASTRODUODENOSCOPY  03/11/2015    HYSTERECTOMY      LIVER BIOPSY  05/22/2015    steatohepatitis with periportal and focal septal fibrosis(stage 2-3 of 4). Small focus of benign epithelial lining suggestive of benign cyst, with adjacent chronic inflammation, reactive fibrosis, and hemosiderin depostition B. Liver asparte fluid: hypocellular specimen. Rare benign hepatocysts and macrophages. No malignant cells identified.    MITRAL VALVE REPAIR      TONSILLECTOMY AND ADENOIDECTOMY         MEDICATIONS    Current Outpatient Prescriptions   Medication Sig Dispense Refill    acetaminophen (TYLENOL) 325 MG tablet Take 650 mg by mouth  every 4 hours as needed.      amitriptyline (ELAVIL) 25 MG tablet Take 25 mg by mouth nightly.      aspirin 81 MG tablet Take 81 mg by mouth daily.      Capsaicin-Menthol (SALONPAS GEL) 0.025-1.25 % PTCH as needed.      Cholecalciferol (VITAMIN D PO) Take 50,000 capsules by mouth once a week.        diazepam (VALIUM) 5 MG tablet Take 5 mg by mouth 2 times daily.      DULoxetine (CYMBALTA) 30 MG capsule Take 30 mg by mouth daily.        glipiZIDE (GLUCOTROL) 5 MG tablet Take 5 mg by mouth 2 times daily.      LATANOPROST OP       levothyroxine (SYNTHROID) 100 MCG tablet Take 112 mcg by mouth daily.        LOSARTAN POTASSIUM PO Take 25 mg by mouth daily.  lovastatin (MEVACOR) 10 MG tablet Take 10 mg by mouth every evening.      melatonin 1 MG tablet Take 1 mg by mouth at bedtime.      Menthol, Topical Analgesic, (STOPAIN EX) 2 times daily. 1-2 applications apply on the skin twice a day; 1 spray prn      metoprolol succinate (TOPROL XL) 50 MG XL tablet Take 50 mg by mouth daily.      oxyCODONE-acetaminophen (PERCOCET) 5-325 MG per tablet Take 1 tablet by mouth every 6 hours as needed.      pantoprazole (PROTONIX) 40 MG tablet Take 40 mg by mouth daily.      timolol (BETIMOL) 0.5 % ophthalmic solution Place 1 drop into both eyes 2 times daily. use in affected eye(s)      zolpidem (AMBIEN) 5 MG tablet Take 5 mg by mouth nightly as needed for Insomnia.       No current facility-administered medications for this visit.        ALLERGIES:   Allergies   Allergen Reactions    Ceftriaxone Rash    Iodine Rash    Metformin Rash    Macrobid [Nitrofurantoin] Unspecified       FAMILY HISTORY: no family Hx of liver disease or CRC    SOCIAL HISTORY:   Previous missionary  No T, E, or D  No longer works    PHYSICAL EXAM:  BP 119/68 (BP Location: Left arm, BP Patient Position: Sitting, BP cuff size: Regular)   Pulse 73   Temp 97.8 F (36.6 C) (Oral)   Resp 16   Ht 5' 5.5" (1.664 m)   Wt 75.8 kg (167 lb)    SpO2 96%   Breastfeeding? No   BMI 27.37 kg/m2  General:  Appears tired, somewhat anxious, sitting in wheelchair wheeled in by son  Eyes: No jaundice.  ENT: Oropharynx is clear, mucus membranes are moist.   Pulmonary:  Clear to auscultation bilaterally.  Cardiovascular: Regular rate and rhythm. No murmurs, rubs, gallops. Crisp S1 from MV replacement.  GI: Abdomen soft, non-tender, non-distended.  Extremities: No lower extremity edema.  Neurological:  No asterixis.  Skin: No rashes.  Psych: normal affect  Heme: no bruising    LABS  Laboratory tests were reviewed in Epic.     Imaging Studies:  Imaging tests were reviewed in Epic.     Endoscopy Reports:  Endoscopy results were reviewed in Epic.     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    Given the liver biopsy result, the lesion is a benign hepatic cyst that does not meet any current indications for therapy (signs of biliary/hepatic compression/congestion, recurrent bleeding, malignant concern, diaphragmatic compromise). It can be monitored clinically for now, as it is not in ideal position for surgical intervention (also on antiplatelet therapy for two indications).    More concerning is her biopsy proven NASH w/ stage 2-3 fibrosis in 05/2015. She was previously significantly more overnight and was never a drinker - never used MTX. She has lost significant weight intentionally, query if steatosis and fibrosis may have improved in the interim.    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

## 2016-08-31 ENCOUNTER — Telehealth (INDEPENDENT_AMBULATORY_CARE_PROVIDER_SITE_OTHER): Payer: Self-pay | Admitting: Hepatology

## 2016-08-31 NOTE — Telephone Encounter (Signed)
ROI

## 2016-09-04 LAB — ALPHA FETOPROTEIN TOTAL & L3 PERCENT
AFP L3%: <0.5 % (ref 0.0–9.9)
AFP Total: 1 ng/mL (ref 0–15)

## 2016-09-04 NOTE — Telephone Encounter (Signed)
Imaging from [facility] / Suzanna Obey has been requestd.    Hospitalization records from [facility] has been requested.

## 2016-09-05 NOTE — Telephone Encounter (Signed)
Records has been received and scanned to media.    Routed to team for awareness.

## 2016-09-11 ENCOUNTER — Other Ambulatory Visit: Payer: Self-pay

## 2016-09-12 ENCOUNTER — Telehealth (HOSPITAL_COMMUNITY): Payer: Self-pay | Admitting: Thoracic Surgery (Cardiothoracic Vascular Surgery)

## 2016-09-12 NOTE — Telephone Encounter (Signed)
Called and left a message regarding a consult apt for wed 8/1 as well as left a message with Dr. Robina Ade referral coordinator for a consult auth and also last clinic note to be faxed to our office.Cathy Lucas

## 2016-09-20 ENCOUNTER — Ambulatory Visit (INDEPENDENT_AMBULATORY_CARE_PROVIDER_SITE_OTHER): Payer: Medicaid Other | Admitting: Thoracic Surgery (Cardiothoracic Vascular Surgery)

## 2016-09-20 VITALS — BP 145/88 | HR 77 | Temp 98.3°F

## 2016-09-20 DIAGNOSIS — Z952 Presence of prosthetic heart valve: Secondary | ICD-10-CM

## 2016-09-20 DIAGNOSIS — I340 Nonrheumatic mitral (valve) insufficiency: Secondary | ICD-10-CM

## 2016-09-20 DIAGNOSIS — I272 Pulmonary hypertension, unspecified: Secondary | ICD-10-CM

## 2016-09-21 ENCOUNTER — Encounter (INDEPENDENT_AMBULATORY_CARE_PROVIDER_SITE_OTHER): Payer: Self-pay | Admitting: Thoracic Surgery (Cardiothoracic Vascular Surgery)

## 2016-09-21 ENCOUNTER — Telehealth (HOSPITAL_COMMUNITY): Payer: Self-pay | Admitting: Thoracic Surgery (Cardiothoracic Vascular Surgery)

## 2016-09-21 MED ORDER — HYDROMORPHONE HCL 2 MG OR TABS
4.00 mg | ORAL_TABLET | Freq: Four times a day (QID) | ORAL | Status: DC | PRN
Start: ? — End: 2020-03-22

## 2016-09-21 MED ORDER — HYDROXYCHLOROQUINE SULFATE 200 MG OR TABS
200.00 mg | ORAL_TABLET | Freq: Every day | ORAL | Status: DC
Start: ? — End: 2016-11-20

## 2016-09-21 MED ORDER — INSULIN GLARGINE 100 UNIT/ML SC SOLN
Freq: Every evening | SUBCUTANEOUS | Status: DC
Start: ? — End: 2016-11-20

## 2016-09-21 MED ORDER — LATANOPROST 0.005 % OP SOLN
1.00 [drp] | Freq: Every evening | OPHTHALMIC | Status: DC
Start: ? — End: 2016-11-20

## 2016-09-21 MED ORDER — NITROGLYCERIN CR 2.5 MG OR CPCR
2.50 mg | ORAL_CAPSULE | Freq: Two times a day (BID) | ORAL | Status: DC
Start: ? — End: 2016-12-27

## 2016-09-21 MED ORDER — BUPRENORPHINE HCL 150 MCG BU FILM: 150.00 ug | ORAL_FILM | Freq: Two times a day (BID) | BUCCAL | Status: AC

## 2016-09-21 MED ORDER — DORZOLAMIDE HCL 2 % OP SOLN
2.00 [drp] | Freq: Three times a day (TID) | OPHTHALMIC | Status: DC
Start: ? — End: 2016-11-20

## 2016-09-21 MED ORDER — BRIMONIDINE TARTRATE 0.2 % OP SOLN
1.00 [drp] | Freq: Three times a day (TID) | OPHTHALMIC | Status: DC
Start: ? — End: 2016-12-18

## 2016-09-21 MED ORDER — LEFLUNOMIDE 20 MG OR TABS
20.00 mg | ORAL_TABLET | Freq: Every day | ORAL | Status: DC
Start: ? — End: 2016-11-20

## 2016-09-21 NOTE — Progress Notes (Signed)
History and Physical     Referring MD:   Burton Apley, MD  [address]  [place], [place] [address]    Attending MD:   Cherly Anderson.[place]     Chief Complaint   Patient presents with    Consultation      History of Present Illness:     Cathy Lucas is a 66 year old female who is here for evaluation and surgical recommendation regarding Severe Pulmonary Hypertension and Moderate Mitral Regurgitation she has a surgical history of bovine Bioprosthetic MVR, Left atrial MAZE, LAA Closure in 08/25/2010 by Dr. 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      Patient with Echo 06/28/2016 in Dr. Layla Barter office showing LVEF of 59%, No evidence of LVH or LV dysfunction,  Aortic Sclerosis without stenosis,  Intact mitral valve prior prosthetic valve, moderate mitral regurgitation.  Moderate tricuspid regurgitation.  Severe pulmonary hypertension with RVSP 81.  Since previous echo 06/30/2015 mild MR/TR progressed to moderate.  Mild pulmonary hypertension had progressed to severe with RVSP increased from 40-81 mmHG.    The patient recently underwent Cardiac Cath 09/12/2016 showing Markedly elevated right heart pressures wth a PA pressure of 112/18, mean 56 mmHG, PAW 26 mmHg, PVR 8 Wood units, LVEF 59%, 3+ bioprosthetic mitral regurgitation, and normal coronary arteries.    Endocrinology is currently working her up for hypoglycemia which wakes her in the night with BG in the 60-70's at which time she eats something and it recovers.  She reports BG initially as low as the high 20's, there is concern for a pancreatic tumor, seeing Dr. Darnell Level.    In march of this year the patient was  admitted to [facility] for a major flu at which time her work-up was negative but after which her breathing began to deteriorate.  Initially she was SOB with 4-5 steps prior to recently discontinuing RA meds and is now able to walk on the level for a short distance, not able to climb stairs.  She has LE edema, chest tightness that improves with rest or relaxing, has significant palpitations 1-2 times a week that are self limiting.  Her Dental care is poor, she is being worked up by her dentist for a number of extractions currently.      Presents with supportive son.  Her husband passed away a number of years ago.    Past Medical History:   Diagnosis Date    Back pain     Needs L4 fusion    Depression     Diabetes (CMS-HCC)     Fibromyalgia     GERD (gastroesophageal reflux disease)     Glaucoma     Hypercholesterolemia     Hypertension     Hypothyroidism     Migraines      Past Surgical History:   Procedure Laterality Date    BILE DUCT STENT PLACEMENT      CHOLECYSTECTOMY      COLONOSCOPY  05/31/2012    normal mucosa with internal hemorrhoids.     ESOPHAGOGASTRODUODENOSCOPY  03/11/2015    HYSTERECTOMY      LIVER BIOPSY  05/22/2015    steatohepatitis with periportal and focal septal fibrosis(stage 2-3 of 4). Small  focus of benign epithelial lining suggestive of benign cyst, with adjacent chronic inflammation, reactive fibrosis, and hemosiderin depostition B. Liver asparte fluid: hypocellular specimen. Rare benign hepatocysts and macrophages. No malignant cells identified.    MITRAL VALVE REPAIR      TONSILLECTOMY AND ADENOIDECTOMY       Allergies   Allergen Reactions    Ceftriaxone Rash    Iodine Rash    Metformin Rash    Macrobid [Nitrofurantoin] Unspecified     Current Outpatient Prescriptions   Medication Sig    atorvastatin (LIPITOR) 10 MG tablet Take 10 mg by mouth daily.    brimonidine (ALPHAGAN) 0.2 % ophthalmic solution Place 1 drop into both eyes 3 times daily.    Buprenorphine HCl  (BELBUCA) 150 MCG FILM Suck 150 mcg in mouth 2 times daily.    carvedilol (COREG) 25 MG tablet Take 25 mg by mouth 2 times daily (with meals).    Cholecalciferol (VITAMIN D PO) Take 50,000 capsules by mouth once a week.      clopidogrel (PLAVIX) 75 MG tablet Take 75 mg by mouth daily.    dorzolamide (TRUSOPT) 2 % ophthalmic solution Place 2 drops into both eyes 3 times daily.    DULoxetine (CYMBALTA) 30 MG capsule Take 30 mg by mouth daily.      glipiZIDE (GLUCOTROL) 5 MG tablet Take 5 mg by mouth 2 times daily.    HYDROmorphone (DILAUDID) 2 MG tablet Take 2 mg by mouth every 4 hours as needed for Moderate Pain (Pain Score 4-6).    hydroxychloroquine (PLAQUENIL) 200 MG tablet Take 200 mg by mouth daily.    insulin glargine (LANTUS) 100 UNIT/ML injection Inject under the skin nightly. Per insulin protocol    latanoprost (XALATAN) 0.005 % ophthalmic solution Place 1 drop into both eyes every evening.    leflunomide (ARAVA) 20 MG tablet Take 20 mg by mouth daily.    levothyroxine (SYNTHROID) 100 MCG tablet Take 112 mcg by mouth daily.      nitroGLYcerin 2.5 MG Controlled-Release capsule Take 2.5 mg by mouth 2 times daily.    ondansetron (ZOFRAN) 8 MG tablet Take 8 mg by mouth every 8 hours as needed for Nausea/Vomiting.    pantoprazole (PROTONIX) 40 MG tablet Take 40 mg by mouth daily.    senna-docusate (STOOL SOFTENER & LAXATIVE) 8.6-50 MG tablet Take 1 tablet by mouth daily.    zolpidem (AMBIEN) 5 MG tablet Take 5 mg by mouth nightly as needed for Insomnia.     No current facility-administered medications for this visit.        Assessed for signs & symptoms of abuse (e.g. Sexual/physical/financial): no    Social History     Social History    Marital status: Married     Spouse name: N/A    Number of children: N/A    Years of education: N/A     Social History Main Topics    Smoking status: Never Smoker    Smokeless tobacco: Never Used    Alcohol use No    Drug use: No    Sexual activity: Not on  file     Social Activities of Daily Living Present    Not on file     Social History Narrative    No narrative on file     Family History   Problem Relation Age of Onset    Stroke Mother     Heart Disease Mother     Stroke Father  Heart Disease Father        Review of Systems: Constitutional: fatigue, and negative for weight loss or gain.  Eyes: amaurosis fugax in June of 2017 on Plavix since without recurrent issues.  Ears, Nose, Mouth, Throat: dental problem.  CV: palpitations, chest pain, lower extremity edema.  Resp: shortness of breath, dyspnea on exertion.  GI: negative for: abdominal pain, melena, hematochezia, constipation, diarrhea.  GU: negative for: dysuria, frequency, hematuria.  Musculoskeletal: back pain.  Integumentary: negative.  Neuro: negative.  Physical Exam:  BP 145/88 (BP Location: Right arm, BP Patient Position: Sitting, BP cuff size: Regular)   Pulse 77   Temp 98.3 F (36.8 C)   SpO2 97%  General Appearance: generally a little pale, deconditioned, alert, no distress, pleasant affect, cooperative, skin warm, dry, and pink  Eyes: conjunctivae and corneas clear. PERRL, EOM's intact.  Nose: normal and mucus membranes pink/moist.  Mouth: normal.  Neck: Trachea midline, no carotid bruits  Heart: Regular Rhythm, murmur appreciated  Lungs: clear to auscultation and percussion, no chest deformities noted.  Abdomen: Abdomen soft, non-tender. No masses or organomegaly. Bowel sounds normal.  Extremities: no cyanosis, clubbing, mild bilateral edema.  Vascular:   Radial: R: 2+ L:2+  Skin: Non lesions of concern, midline sternal surgical incision  Neuro: Gait normal. Reflexes normal and symmetric. Sensation and strength grossly normal.  Mental Status: Appearance/Cooperation: in no apparent distress and well developed and well nourished  Attitude: pleasant   Attention Span: good  Speech: normal volume, rate, and pitch  Mood: positive  Musculoskeletal: grossly intact, ambulates in and out of the  office, no gross deficits    Vital Signs:  Reviewed and stable    Obstetric History     No data available       Cardiac Catheterization 09/12/2016  Markedly elevated right heart pressures wth a PA pressure of 112/18, mean 56 mmHG, PAW 26 mmHg, PVR 8 Wood units, LVEF 59%, 3+ bioprosthetic mitral regurgitation, and normal coronary arteries.    CONCLUSIONS:  1. Markedly elevated right heart pressures including a pulmonary artery pressure of 99/33 and a mean wedge pressure of 26 mmHg.    2. There was a 20 mmHg mean gradient across the bioprosthetic mitral valve.  Using the measured thermodilution cardiac output, 3.76 liters per minute and index 2.1 liters per minute per m2, the calculated mitral valve orifice area was 0.73 cm2 and 0.4 cm2 per m2.  The results were similar for using the Fick cardiac output.    3. On left ventriculography, there was 3+ bioprosthetic mitral regurgitation.   4. Normal left ventricular size and systolic function with a calculated ejection fraction of 59%.  5. Normal coronary arteries.    6. Adequate suture closure of the right femoral artery.      IMPRESSION AND PLAN:  This patient does not have significant coronary artery disease to explain her recent unstable anginal symptoms, but severe bioprosthetic mitral stenosis and regurgitation is responsible for her low threshold dyspnea and chest pain.  With this, she has developed severe pulmonary hypertension, predominantly secondary, although the postvoid residual was elevated to 8 wood units.  There seemed to be little contribution from left ventricular dysfunction since the ejection fraction is 59% and the left ventricular end-diastolic pressure was normal at about 15-17 mmHg.  Although the transesophageal echocardiogram suggested that there was moderate bioprosthetic mitral regurgitation, finding the hemodynamic evidence of severe prosthetic mitral stenosis was somewhat surprising, but her symptoms and finding of secondary pulmonary  hypertension were most consistent with this diagnosis, so it was sought after.  To confirm the presence of bioprosthetic mitral stenosis and regurgitation, a transesophageal echocardiogram might be considered.  It was attempted to get this done immediately following the cardiac catheterization, but logistically, it proved not to be possible.  She will be referred to Dr. Shelbie Ammons of Cardiothoracic Surgery to consider surgical mitral valve replacement, versus transcatheter valve and mitral valve replacement.        Assessment and Care Plan:  66 year old female with Severe Pulmonary HTN and Moderate Mitral Regurgitation she has a surgical history of bovine Bioprosthetic MVR, Left atrial MAZE, LAA Closure in 08/25/2010 by Dr. 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      This is a very complex case with multiple comorbid conditions and significant deconditioning.  Dr. Ardeen Garland discussed redo-sternotomy for mitral valve replacement versus mitral valve replacement via transcatheter valve in valve through a left thoracotomy approach.  At this point the plan is for Dr. Ardeen Garland to discuss the case with [place] TAVR Team for further planning.  In the meantime the patient will continue to pursue dental care including extractions as necessary to prepare for surgical intervention.    Plan:  1.  Get DVD burned of cath and discuss Transcathter options further with [facility] Team.     Does the patient have decision making capacity at this time? yes    Resuscitative status: Full Code, Full Care    The risks, benefits and alternatives of the planned procedure have been discussed with the patient  and/or her legal representative, all questions have been answered and they agree to proceed.    Note Author: Doran Heater, PA  Dr. Ardeen Garland Present for consultation and decision making

## 2016-09-21 NOTE — Telephone Encounter (Signed)
Called patient who we saw in consultation.  Her Cardiologist is Dr. Gaynelle Arabian who she should contact for diuretic therapy.  I left a message suggesting this on her phone.  She will be worked up for possible Transcatheter Valve in Valve Mitral at [facility].

## 2016-09-21 NOTE — Telephone Encounter (Signed)
[facility] - patient requesting a diuretic Rx for her swollen legs to be called in.

## 2016-11-01 ENCOUNTER — Telehealth (HOSPITAL_COMMUNITY): Payer: Self-pay

## 2016-11-01 DIAGNOSIS — I050 Rheumatic mitral stenosis: Secondary | ICD-10-CM

## 2016-11-06 ENCOUNTER — Telehealth (HOSPITAL_COMMUNITY): Payer: Self-pay

## 2016-11-06 NOTE — Telephone Encounter (Signed)
TAVR consult appt and echo scheduled for 11/16/16. Echo at 10. Dr Parke Poisson appt is 1. Cell went to VM. Details left on VM

## 2016-11-08 ENCOUNTER — Telehealth (HOSPITAL_COMMUNITY): Payer: Self-pay

## 2016-11-16 ENCOUNTER — Encounter (HOSPITAL_COMMUNITY): Payer: Self-pay | Admitting: Interventional Cardiology

## 2016-11-16 ENCOUNTER — Ambulatory Visit
Admission: RE | Admit: 2016-11-16 | Discharge: 2016-11-16 | Disposition: A | Discharge status: Home / Self Care | Payer: Medicaid Other | Attending: Nurse Practitioner | Admitting: Nurse Practitioner

## 2016-11-16 ENCOUNTER — Ambulatory Visit: Payer: Medicaid Other | Attending: Nurse Practitioner | Admitting: Interventional Cardiology

## 2016-11-16 VITALS — BP 128/82 | HR 78 | Temp 97.7°F | Resp 16 | Ht 65.5 in | Wt 171.7 lb

## 2016-11-16 DIAGNOSIS — I272 Pulmonary hypertension, unspecified: Secondary | ICD-10-CM | POA: Insufficient documentation

## 2016-11-16 DIAGNOSIS — I050 Rheumatic mitral stenosis: Secondary | ICD-10-CM | POA: Insufficient documentation

## 2016-11-16 DIAGNOSIS — R0609 Other forms of dyspnea: Secondary | ICD-10-CM | POA: Insufficient documentation

## 2016-11-16 DIAGNOSIS — I340 Nonrheumatic mitral (valve) insufficiency: Secondary | ICD-10-CM

## 2016-11-16 DIAGNOSIS — I342 Nonrheumatic mitral (valve) stenosis: Secondary | ICD-10-CM | POA: Insufficient documentation

## 2016-11-16 DIAGNOSIS — R0600 Dyspnea, unspecified: Secondary | ICD-10-CM

## 2016-11-16 DIAGNOSIS — Z952 Presence of prosthetic heart valve: Secondary | ICD-10-CM

## 2016-11-16 DIAGNOSIS — R0602 Shortness of breath: Secondary | ICD-10-CM | POA: Insufficient documentation

## 2016-11-16 LAB — 2D ECHO WITH IMAGE ENHANCEMENT AGENT IF NECESSARY
IVC Diameter: 1.7 cm
Mitral Mean Gradient: 18.3 mmHg
PA Pressure: 101 mmHg

## 2016-11-16 MED ORDER — ASPIRIN 81 MG OR TABS
81.00 mg | ORAL_TABLET | Freq: Every day | ORAL | Status: DC
Start: ? — End: 2021-03-29

## 2016-11-16 MED ORDER — FUROSEMIDE 20 MG OR TABS
20.00 mg | ORAL_TABLET | ORAL | Status: DC | PRN
Start: ? — End: 2019-03-26

## 2016-11-16 NOTE — Progress Notes (Signed)
Interventional Cardiology Consultation Note     Reason For Referral: Possible transcatheter mitral valve replacement    Referring Physician: Dr. Robina Ade, Dr. Ardeen Garland  HPI:  66 year old female who is here for evaluation for possible Transcatheter mitral valve in valve replacement for mitral stenosis. 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 Gunnar Bulla at [facility]. Her PMH  includes moderate mitral regurgitation, severe PH, paroxysmal atrial fibrillation, hypertension, hypercholesterolemia, type 2 diabetes (no medications due to hypoglycemia, now followed by endocrine due to concern for endocrine tumor and hypoglycemia), amaurosis Fugax right eye last occurrence was about 6 months ago before plavix and she has had no further issues on plavix, Rheumatoid arthritis, Lumbar Spine degeneration, and Hepatic nodule.   In march of this year the patient was admitted to [facility] for a major flu at which time her work-up was negative but after which her breathing began to deteriorate.  Initially she was SOB with 4-5 steps prior to recently discontinuing RA meds and is now able to walk on the level for a short distance, not able to climb stairs.  She has LE edema, chest tightness that improves with rest or relaxing, has significant palpitations 1-2 times a week that are self limiting.  Her Dental care is poor, she has completed her extractions and has dental clearance.   Endocrinology is currently working her up for hypoglycemia which wakes her in the night with BG in the 60-70's at which time she eats something and it recovers.  She reports BG initially as low as the high 20's, there is concern for a pancreatic tumor, seeing Dr. Darnell Level.    Echo 06/28/2016 in Dr. Layla Barter office showing LVEF of 59%, No evidence of LVH or LV dysfunction,  Aortic Sclerosis without stenosis,  Intact mitral valve prior prosthetic valve, moderate mitral  regurgitation.  Moderate tricuspid regurgitation.  Severe pulmonary hypertension with RVSP 81.  Since previous echo 06/30/2015 mild MR/TR progressed to moderate.  Mild pulmonary hypertension had progressed to severe with RVSP increased from 40-81 mmHG.    Cardiac Cath 09/12/2016 showing Markedly elevated right heart pressures wth a PA pressure of 112/18, mean 56 mmHG, PAW 26 mmHg, PVR 8 Wood units, LVEF 59%, 3+ bioprosthetic mitral regurgitation, and normal coronary arteries.    Patient complains of Chest tightness with exertion and at rest, SOB, DOE after walking 30 feet, + lightheadedness with and without hypoglycemia.     Patient has been deemed a non-candidate for SMVR secondary too co morbidities and severe PH so she was referred for consultation for possible TMVR.            Patient Active Problem List    Diagnosis Date Noted    Pulmonary HTN 11/16/2016     Added automatically from request for surgery [id number]      NASH (nonalcoholic steatohepatitis) 08/30/2016    Liver cyst 08/30/2016    BMI 27.0-27.9,adult 08/30/2016    Abdominal pain 10/10/2014    Liver mass 10/10/2014     11/25/15 MRI Abd W/WO Contrast ([facility]): Stable appearence of a cystic lesion in the right lobe of the liver near the hepatic dome measuring 3.9 x 6.1 6.5 cm. 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 However, a hemorrhagic cyst remains in the differential . No aggressive/malignant features are noted. However, continued follow-up is recommended to assess stability.  Stable appearence of several additional small simple  cysts within the liver.   Previous cholecystectomy.    11/24/14 MRI Abd W/WO Contrast ([facility]): A well-circumscribed T1 hyperintense cystic lesion in the right hepatic lobe measures 7.6 x 7.7 cm (20-10) with nonenhancing nodularity seen inferiorly and with a single internal septation. This lesion is similar in size and appearance to the MRI examination perfolrmed on 11/09/2011. Similar  smaller cystic lesions are seen at the periphery of hepatic segments six and seven. All of these lesions are unchanged from prior.     10/12/11 [place] Abd ([facility]): Hepatic steatosis. Septated right hepatic cyst. Hypervascular possibly solid lesion in the right lobe. Depending on clinical considerations, further evaluation with pre-and post contrast enhanced MR and CT may be of benefit for further evaluation of these lesions. Cholecystectomy.      06/09/2015 Liver Bx [facility]): Steatohepatitis with periportal and focal septal fibrosis(stage 2-3 of 4). Small focus of benign epithelial lining suggestive of benign cyst, with adjacent chronic inflammation, reactive fibrosis, and hemosiderin depostition B. Liver asparte fluid: hypocellular specimen. Rare benign hepatocysts and macrophages. No malignant cells identified.      Hypertension 10/10/2014    Hypothyroidism 10/10/2014    Fibromyalgia 10/08/2009       Past Medical History:   Diagnosis Date    Back pain     Needs L4 fusion    Depression     Diabetes (CMS-HCC)     Fibromyalgia     GERD (gastroesophageal reflux disease)     Glaucoma     Hypercholesterolemia     Hypertension     Hypothyroidism     Migraines     PAF (paroxysmal atrial fibrillation) (CMS-HCC)        Past Surgical History:   Procedure Laterality Date    BILE DUCT STENT PLACEMENT      CHOLECYSTECTOMY      COLONOSCOPY  05/31/2012    normal mucosa with internal hemorrhoids.     ESOPHAGOGASTRODUODENOSCOPY  03/11/2015    HYSTERECTOMY      LIVER BIOPSY  05/22/2015    steatohepatitis with periportal and focal septal fibrosis(stage 2-3 of 4). Small focus of benign epithelial lining suggestive of benign cyst, with adjacent chronic inflammation, reactive fibrosis, and hemosiderin depostition B. Liver asparte fluid: hypocellular specimen. Rare benign hepatocysts and macrophages. No malignant cells identified.    MITRAL VALVE REPAIR      TONSILLECTOMY AND ADENOIDECTOMY              No current facility-administered medications for this visit.      No current outpatient prescriptions on file.     Facility-Administered Medications Ordered in Other Visits   Medication Dose Route Frequency Provider Last Rate Last Dose    predniSONE (DELTASONE) tablet 50 mg  50 mg Oral Once Shelba Flake, MD        predniSONE (DELTASONE) tablet 50 mg  50 mg Oral Once Shelba Flake, MD             Allergies   Allergen Reactions    Ceftriaxone Rash    Iodine Rash    Iv Contrast [Contrast Media] Rash     Itching swelling uable to breathe      Metformin Rash    Ativan [Lorazepam] Other     Heart palpitations      Januvia [Sitagliptin] Other     Sob, chest pain, heart palpitations      Macrobid [Nitrofurantoin] Unspecified    Tylenol [Acetaminophen] Other  bc of the lesion of the liver.         Family History   Problem Relation Age of Onset    Stroke Mother     Heart Disease Mother     Stroke Father     Heart Disease Father        Social History     Social History    Marital status: Married     Spouse name: N/A    Number of children: N/A    Years of education: N/A     Occupational History    Not on file.     Social History Main Topics    Smoking status: Never Smoker    Smokeless tobacco: Never Used    Alcohol use No    Drug use: No    Sexual activity: Not on file     Other Topics Concern    Not on file     Social History Narrative       Review Of Systems  As follows plus as noted in HPI/PMH  General:  Negative for recent or current fevers, chills or night sweats.  Skin: Negative for current rashes, sores or infection.  Eyes: Negative for visual changes, diplopia or blurry vision.   Ears/Nose/Throat/Mouth: Negative for current dental infection or problems, she was cleared for dental, all her extractions have been done.  Negative for current sore throat or congestion.   Respiratory: Negative for current cough or sputum production. No history wheezing/asthma. Denies  snoring or sleep apnea. + PH  Cardiovascular: Negative or palpitations. + HX CHF  Gastrointestinal: Negative for nausea, vomiting, diarrhea, melena or hematochezia. Denies GERD  Denies Liver disease  Genitourinary: Negative for dysuria, frequency, hesitancy or nocturia. +CKD.  Musculoskeletal: Denies significant joint pain.  Denies neck or back problems.  Neurologic: Negative for history of seizures, numbness, tingling or weakness. Denies history of TIA or CVA. +amaurosis Fugax right eye  Denies Syncope or pre-syncopal episodes + lightheadedness  Psychiatric: negative  Hematologic/Lymphatic/Immunologic: Negative for anemia or bleeding problems or clotting disorders.  Negative for DVT or PE history.    Physical Exam:   Vitals: BP 128/82 (BP Location: Right arm, BP Patient Position: Sitting, BP cuff size: Regular)   Pulse 78   Temp 97.7 F (36.5 C) (Oral)   Resp 16   Ht 5' 5.5" (1.664 m)   Wt 77.9 kg (171 lb 11.2 oz)   SpO2 97%   BMI 28.14 kg/m2    General Description: Alert and oriented. Affect normal. Cooperative  Chest and Lungs: No deformity. Lungs clear and full to bilateral bases.  Heart: Regular rate and rhythm. No rub or gallop. Minimal murmur noted   Abdomen: BS+, soft, non-distended, non-tender.  Integument: No active lesion.  HEENT: Throat clear  Neck/Thyroid: Supple, FROM , No carotid Bruit  Neurologic: No focal deficit  Musculoskeletal: Moves all extremities without apparent deficit  Peripheral Vascular: Radial pulses 2+ bilateral / Pedal pulses 2+ bilateral no edema     Recent Labs:  Lab Results   Component Value Date    NA 137 08/30/2016    K 4.0 08/30/2016    CL 98 08/30/2016    BICARB 26 08/30/2016    BUN 13 08/30/2016    CREAT 0.63 08/30/2016    GLU 160 (H) 08/30/2016    [place] 9.6 08/30/2016    AST 13 08/30/2016    ALT <5 08/30/2016    ALK 94 08/30/2016    TBILI 0.50 08/30/2016  ALB 4.3 08/30/2016    TP 7.7 08/30/2016       Lab Results   Component Value Date    WBC 7.7 08/30/2016    RBC 4.27  08/30/2016    HGB 12.5 08/30/2016    HCT 38.2 08/30/2016    MCV 89.5 08/30/2016    MCHC 32.7 08/30/2016    RDW 14.8 (H) 08/30/2016    PLT 283 08/30/2016    MPV 10.4 08/30/2016    LYMPHS 14 08/30/2016    MONOS 7 08/30/2016    EOS 5 08/30/2016       Lab Results   Component Value Date    PT 12.3 08/30/2016    INR 1.1 08/30/2016    PTT 28.3 10/10/2014       Cardiology studies:  EKG: no reports at time of visit     ECHO: 11/16/16   PAP 101 Mitral MG 18.3  PHYSICIAN INTERPRETATION:    Left Ventricle: The left ventricular size is normal. There is normal left ventricular function. Left ventricular ejection fraction by Simpson's biplane is 59 %. There is mild left ventricular hypertrophy. Indeterminate left ventricular diastolic   function.    Right Ventricle: The right ventricular size is normal. Global right ventricular systolic function is normal. Severe pulmonary hypertension with right ventricular systolic pressure measuring 101 mmHg.    Left Atrium: The left atrium is normal sized.    Right Atrium: The right atrium is mildly dilated.    Pericardium: There is no evidence of pericardial effusion.    Mitral Valve: Moderate mitral valve regurgitation. A tissue prosthetic valve with stenosis and central regurgitation is present. Severe mitral valve stenosis. Mean pressure gradient of 57m Hg.    Tricuspid Valve: The tricuspid valve is normal in structure. Mild to moderate tricuspid regurgitation.    Aortic Valve: The aortic valve appeared normal (trileaflet). Aortic valve sclerosis is present. Trivial aortic valve regurgitation is seen.    Pulmonic Valve: The pulmonic valve is normal. Mild pulmonic valve regurgitation.    Aorta: The aortic root is normal measuring 3.20 cm and 1.73 cm/m index. The ascending aorta is normal measuring 3.00 cm and 1.63 cm/m index.    Venous: The inferior vena cava was normal sized with respiratory variation greater than 50%.    Summary:  1. The left ventricular size is normal and  the left ventricular systolic function is normal.  2. Prosthetic tissue mitral valve with stenosis and central regurgitation.  3. Mean inflow gradient of 20 mm Hg across mitral prosthesis.  4. Severe pulmonary hypertension with right ventricular systolic pressure measuring 101 mmHg.  5. No previous study for comparison.    Cardiac Catheterization 09/12/2016  Markedly elevated right heart pressures wth a PA pressure of 112/18, mean 56 mmHG, PAW 26 mmHg, PVR 8 Wood units, LVEF 59%, 3+ bioprosthetic mitral regurgitation, and normal coronary arteries.    CONCLUSIONS:  1. Markedly elevated right heart pressures including a pulmonary artery pressure of 99/33 and a mean wedge pressure of 26 mmHg.    2. There was a 20 mmHg mean gradient across the bioprosthetic mitral valve.  Using the measured thermodilution cardiac output, 3.76 liters per minute and index 2.1 liters per minute per m2, the calculated mitral valve orifice area was 0.73 cm2 and 0.4 cm2 per m2.  The results were similar for using the Fick cardiac output.    3. On left ventriculography, there was 3+ bioprosthetic mitral regurgitation.   4. Normal left ventricular size and systolic function  with a calculated ejection fraction of 59%.  5. Normal coronary arteries.    6. Adequate suture closure of the right femoral artery.      Assessment/Plan:   66 year old female who is here for evaluation for possibleTMVR for mitral stenosis. 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 Gunnar Bulla at [facility]. Her PMH  includes moderate mitral regurgitation, severe PH, paroxysmal atrial fibrillation, hypertension, hypercholesterolemia, type 2 diabetes (no medications due to hypoglycemia, now followed by endocrine due to concern for endocrine tumor and hypoglycemia), amaurosis Fugax right eye last occurrence was about 6 months ago before plavix and she has had no further  issues on plavix, Rheumatoid arthritis, Lumbar Spine degeneration, and Hepatic nodule.      NYHA III    1.Mitral Regurgitation   --plan for RHC and possible LHC to evaluate her PH status and LV pressures to determine the severity of her MS and PH to be able to determine treatment and next testing that will be needed.    --Scheduled for Monday 12/19/16 verbal instructions given to the patient to arrive at 11:00 AM, Allowed to have very light breakfast before 5:00 AM due her hypoglycemia, she will monitor BS and take glucose tabs as needed. NPO after 6 AM. Am meds with sip of water, go to second floor SCC.   --Pending cath results, she may need to have a TEE as well to throughly evaluate the mitral valve.   --She will be referred to [facility] clinic for full evaluation and treatment as needed for her Hart  --Pending work up, if she is a candidate for Transcatheter mitral valve in valve replacement, we will need to obtian CTS scans and get consultation appointment with Dr. Almond Lint.     Discussed known risks associated with this procedure, which include but are not limited to minor risks of bleeding, infection, arrhythmia requiring cardioversion or defibrillation, transient kidney failure, vascular access site complication and major risks including heart attack, stroke, emergency open heart surgery, having residual ASD requiring repair and death.     The patient is seen in collaboration with Dr. Parke Poisson, who entered the room, confirmed pertinent parts of the history and physical examination and participated in the clinical decision making and formulation of plan as recorded by me.      Lavonia Drafts, NP   Interventional Cardiology  [phone]

## 2016-11-17 ENCOUNTER — Telehealth (HOSPITAL_COMMUNITY): Payer: Self-pay | Admitting: Nurse Practitioner

## 2016-11-17 NOTE — Telephone Encounter (Signed)
Just received a call from [employer]. Gave them the needed information for her urgent RHC on Monday. They will try to get it approved. Will not cancel case at this time. Family is aware that we will call them Monday with update at the latest.   Lavonia Drafts NP

## 2016-11-20 ENCOUNTER — Encounter (HOSPITAL_BASED_OUTPATIENT_CLINIC_OR_DEPARTMENT_OTHER): Admission: RE | Disposition: A | Discharge status: Home Health | Payer: Self-pay | Attending: Interventional Cardiology

## 2016-11-20 ENCOUNTER — Ambulatory Visit
Admission: RE | Admit: 2016-11-20 | Discharge: 2016-11-20 | Disposition: A | Discharge status: Home / Self Care | Payer: Medicaid Other | Attending: Interventional Cardiology | Admitting: Interventional Cardiology

## 2016-11-20 ENCOUNTER — Encounter (HOSPITAL_COMMUNITY): Payer: Self-pay | Admitting: Nurse Practitioner

## 2016-11-20 DIAGNOSIS — K219 Gastro-esophageal reflux disease without esophagitis: Secondary | ICD-10-CM | POA: Insufficient documentation

## 2016-11-20 DIAGNOSIS — Z886 Allergy status to analgesic agent status: Secondary | ICD-10-CM | POA: Insufficient documentation

## 2016-11-20 DIAGNOSIS — Z8249 Family history of ischemic heart disease and other diseases of the circulatory system: Secondary | ICD-10-CM | POA: Insufficient documentation

## 2016-11-20 DIAGNOSIS — Z7982 Long term (current) use of aspirin: Secondary | ICD-10-CM | POA: Insufficient documentation

## 2016-11-20 DIAGNOSIS — Z91041 Radiographic dye allergy status: Secondary | ICD-10-CM | POA: Insufficient documentation

## 2016-11-20 DIAGNOSIS — E7800 Pure hypercholesterolemia, unspecified: Secondary | ICD-10-CM | POA: Insufficient documentation

## 2016-11-20 DIAGNOSIS — Z953 Presence of xenogenic heart valve: Secondary | ICD-10-CM | POA: Insufficient documentation

## 2016-11-20 DIAGNOSIS — M797 Fibromyalgia: Secondary | ICD-10-CM | POA: Insufficient documentation

## 2016-11-20 DIAGNOSIS — I050 Rheumatic mitral stenosis: Secondary | ICD-10-CM | POA: Insufficient documentation

## 2016-11-20 DIAGNOSIS — Z881 Allergy status to other antibiotic agents status: Secondary | ICD-10-CM | POA: Insufficient documentation

## 2016-11-20 DIAGNOSIS — I480 Paroxysmal atrial fibrillation: Secondary | ICD-10-CM | POA: Insufficient documentation

## 2016-11-20 DIAGNOSIS — Z823 Family history of stroke: Secondary | ICD-10-CM | POA: Insufficient documentation

## 2016-11-20 DIAGNOSIS — I10 Essential (primary) hypertension: Secondary | ICD-10-CM | POA: Insufficient documentation

## 2016-11-20 DIAGNOSIS — E785 Hyperlipidemia, unspecified: Secondary | ICD-10-CM | POA: Insufficient documentation

## 2016-11-20 DIAGNOSIS — E119 Type 2 diabetes mellitus without complications: Secondary | ICD-10-CM | POA: Insufficient documentation

## 2016-11-20 DIAGNOSIS — I342 Nonrheumatic mitral (valve) stenosis: Secondary | ICD-10-CM

## 2016-11-20 DIAGNOSIS — Z90710 Acquired absence of both cervix and uterus: Secondary | ICD-10-CM | POA: Insufficient documentation

## 2016-11-20 DIAGNOSIS — Z888 Allergy status to other drugs, medicaments and biological substances status: Secondary | ICD-10-CM | POA: Insufficient documentation

## 2016-11-20 DIAGNOSIS — H409 Unspecified glaucoma: Secondary | ICD-10-CM | POA: Insufficient documentation

## 2016-11-20 DIAGNOSIS — E039 Hypothyroidism, unspecified: Secondary | ICD-10-CM | POA: Insufficient documentation

## 2016-11-20 DIAGNOSIS — I272 Pulmonary hypertension, unspecified: Secondary | ICD-10-CM | POA: Insufficient documentation

## 2016-11-20 DIAGNOSIS — Z794 Long term (current) use of insulin: Secondary | ICD-10-CM | POA: Insufficient documentation

## 2016-11-20 DIAGNOSIS — F329 Major depressive disorder, single episode, unspecified: Secondary | ICD-10-CM | POA: Insufficient documentation

## 2016-11-20 LAB — BASIC METABOLIC PANEL, BLOOD
Anion Gap: 15 mmol/L (ref 7–15)
BUN: 11 mg/dL (ref 8–23)
Bicarbonate: 21 mmol/L — ABNORMAL LOW (ref 22–29)
Calcium: 9 mg/dL (ref 8.5–10.6)
Chloride: 103 mmol/L (ref 98–107)
Creatinine: 0.63 mg/dL (ref 0.51–0.95)
GFR: >60 mL/min
Glucose: 134 mg/dL — ABNORMAL HIGH (ref 70–99)
Potassium: 3.9 mmol/L (ref 3.5–5.1)
Sodium: 139 mmol/L (ref 136–145)

## 2016-11-20 LAB — HEMOGRAM, BLOOD
Hct: 37.3 % (ref 34.0–45.0)
Hgb: 12.2 gm/dL (ref 11.2–15.7)
MCH: 27.7 pg (ref 26.0–32.0)
MCHC: 32.7 g/dL (ref 32.0–36.0)
MCV: 84.6 um3 (ref 79.0–95.0)
MPV: 10.5 fL (ref 9.4–12.4)
Plt Count: 201 10*3/uL (ref 140–370)
RBC: 4.41 10*6/uL (ref 3.90–5.20)
RDW: 14.3 % — ABNORMAL HIGH (ref 12.0–14.0)
WBC: 6.9 10*3/uL (ref 4.0–10.0)

## 2016-11-20 LAB — GLUCOSE (POCT)
Glucose (POCT): 152 mg/dL — ABNORMAL HIGH (ref 70–99)
Glucose (POCT): 114 mg/dL — ABNORMAL HIGH (ref 70–99)
Glucose (POCT): 170 mg/dL — ABNORMAL HIGH (ref 70–99)

## 2016-11-20 SURGERY — INSERTION, CATHETER, RIGHT HEART
Anesthesia: Moderate Sedation - by non-anesthesia staff only

## 2016-11-20 MED ORDER — FENTANYL CITRATE (PF) 100 MCG/2ML IJ SOLN
INTRAMUSCULAR | Status: AC
Start: 2016-11-20 — End: ?
  Filled 2016-11-20: qty 2

## 2016-11-20 MED ORDER — DEXTROSE (DIABETIC USE) 40 % OR GEL
ORAL | Status: DC
Start: 2016-11-20 — End: 2016-11-20
  Filled 2016-11-20: qty 37.5

## 2016-11-20 MED ORDER — DIPHENHYDRAMINE HCL 25 MG OR TABS OR CAPS CUSTOM
25.0000 mg | ORAL_CAPSULE | Freq: Once | ORAL | Status: DC
Start: 2016-11-20 — End: 2016-11-20

## 2016-11-20 MED ORDER — PREDNISONE 10 MG OR TABS
50.0000 mg | ORAL_TABLET | Freq: Once | ORAL | Status: DC
Start: 2016-11-20 — End: 2016-11-20

## 2016-11-20 MED ORDER — DIPHENHYDRAMINE HCL 25 MG OR TABS OR CAPS CUSTOM
25.00 mg | ORAL_CAPSULE | Freq: Once | ORAL | Status: AC
Start: 2016-11-20 — End: 2016-11-20
  Administered 2016-11-20 (×2): 25 mg via ORAL
  Filled 2016-11-20: qty 1

## 2016-11-20 MED ORDER — PREDNISONE 10 MG OR TABS
50.0000 mg | ORAL_TABLET | Freq: Once | ORAL | Status: DC
Start: 2016-11-20 — End: 2016-11-20
  Filled 2016-11-20 (×2): qty 5

## 2016-11-20 MED ORDER — DEXTROSE (DIABETIC USE) 40 % OR GEL
1.00 | Freq: Once | ORAL | Status: AC
Start: 2016-11-20 — End: 2016-11-20
  Administered 2016-11-20: 1 via ORAL

## 2016-11-20 MED ORDER — MIDAZOLAM HCL 2 MG/2ML IJ SOLN
INTRAMUSCULAR | Status: AC
Start: 2016-11-20 — End: ?
  Filled 2016-11-20: qty 2

## 2016-11-20 SURGICAL SUPPLY — 13 items
APPLICATOR CHLORAPREP 10.5ML (Kits/Sets/Trays) ×2
APPLICATOR CHLORAPREP 26ML, ~~LOC~~ (Misc Medical Supply) ×2 IMPLANT
CATHETER 5FR STRT PIG IMPULSE (Procedural wires/sheaths/catheters/balloons/dilators) ×2 IMPLANT
CATHETER SWAN GANZ 7FR HEP. (Lines/Drains) ×2 IMPLANT
COMPRESSION TR BAND 24CM, REGULAR RADIAL (Misc Medical Supply) ×2
COVER PROBE 10X249CM (Drape/Gowns/Gloves/Pack) ×2 IMPLANT
DRAPE TRANSRADIAL FEMORAL ANGIOGRAPHY - MEDLINE (Drapes/towels) ×2
ELECTRODE RED DOT REPOS CLOTH (Misc Medical Supply) ×2
GLIDESHEATH SLENDER 0.025" 6FR X 10CM (Procedural wires/sheaths/catheters/balloons/dilators) ×2 IMPLANT
LEADWEAR SINGLE 2.0 SZ 73/5 LRG (Misc Medical Supply) ×2 IMPLANT
PACK HEART MANIFOLD RT 2 PORT (Misc Medical Supply) ×2 IMPLANT
SET PRECISION PINNACLE ACCESS 7FR ECHOGENIC NEEDLE SS WIRE 10CM SHEATH (Procedural wires/sheaths/catheters/balloons/dilators) ×2 IMPLANT
TRAY CARDIAC CATH CUSTOM (Kits/Sets/Trays) ×2 IMPLANT

## 2016-11-20 NOTE — Discharge Instructions (Addendum)
Instructions for After Discharge-TR Band post procedure patient instructions    ? Do not subject hand to any forceful movements for 24hrs.  (For example: supporting weight when rising from a chair or bed)  ? Do not drive a car for 58PFY.  ? The dressing on the puncture site may be removed after 24hrs and replaced with a Band-Aid or left open to air.  ? You may shower on the day following the procedure. Do not take a tub bath, submerge hand in sink full of dishes, sit in a hot tub or submerge the puncture site in water for three days following the procedure.    For 48 hours following the procedure  ? Do not operate a lawnmower, motorcycle, chainsaw, or all-terrain vehicle.  ? Do not lift anything heavier than 1 pound with the affected arm.  ? Avoid excessive (extension/flexion) wrist movement.  ? Avoid heavy lifting with the affected arm for two days following discharge.  ? Do not engage in vigorous exercise (tennis, golf) using the affected arm for two days following discharge.    If bleeding should occur following discharge  ? Sit down and apply firm pressure to site with your fingers for 10 minutes.  ? If the bleeding stops continue to sit, keep your wrist straight for 2 hours AND notify your physician as soon as possible.  ? If bleeding does not stop after 10 minutes or if there is a large amount of bleeding or spurting, CALL [phone] immediately.  Do NOT drive yourself to the hospital.    Expect mild tingling of hand and tenderness at the puncture site for up to 3 days.  If this persists or other symptoms develop (change in color or temperature of the hand or arm; redness, heat or pus at the puncture site; chills or fever greater than 100.4 degree F), notify your physician.      Questions or concerns:  Call cardiology clinic during normal business hours, after hours call [phone] and ask for interventional cardiologist on call.  General non-emergent questions can call PTU at [phone].

## 2016-11-20 NOTE — Interdisciplinary (Signed)
Missing attending signature  But patient signed consent with Rodena Piety PA and agreed to have the procedure done

## 2016-11-20 NOTE — H&P (Signed)
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 Gunnar Bulla at [facility]. Her PMH  includes moderate mitral regurgitation, severe PH, paroxysmal atrial fibrillation, hypertension, hypercholesterolemia, type 2 diabetes here for RHC and LHC (without LV gram and no coronary angiogram as she had her cath 2 months ago) prior to making final decisions.    ROS: A complete review of all body systems was obtained and it was negative except mentioned per HPI    Pain Score: 0    Active Hospital Problems    Diagnosis Date Noted    Pulmonary HTN 11/16/2016     Added automatically from request for surgery [facility] Problems    Diagnosis Date Resolved   No resolved problems to display.       Past Medical History:   Diagnosis Date    Back pain     Needs L4 fusion    Depression     Diabetes (CMS-HCC)     Fibromyalgia     GERD (gastroesophageal reflux disease)     Glaucoma     Hypercholesterolemia     Hypertension     Hypothyroidism     Migraines     PAF (paroxysmal atrial fibrillation) (CMS-HCC)       Past Surgical History:   Procedure Laterality Date    BILE DUCT STENT PLACEMENT      CHOLECYSTECTOMY      COLONOSCOPY  05/31/2012    normal mucosa with internal hemorrhoids.     ESOPHAGOGASTRODUODENOSCOPY  03/11/2015    HYSTERECTOMY      LIVER BIOPSY  05/22/2015    steatohepatitis with periportal and focal septal fibrosis(stage 2-3 of 4). Small focus of benign epithelial lining suggestive of benign cyst, with adjacent chronic inflammation, reactive fibrosis, and hemosiderin depostition B. Liver asparte fluid: hypocellular specimen. Rare benign hepatocysts and macrophages. No malignant cells identified.    MITRAL VALVE REPAIR      TONSILLECTOMY AND  ADENOIDECTOMY       Family History   Problem Relation Age of Onset    Stroke Mother     Heart Disease Mother     Stroke Father     Heart Disease Father      Social History     Social History    Marital status: Married     Spouse name: N/A    Number of children: N/A    Years of education: N/A     Social History Main Topics    Smoking status: Never Smoker    Smokeless tobacco: Never Used    Alcohol use No    Drug use: No    Sexual activity: Not on file     Social Activities of Daily Living Present    Not on file     Social History Narrative       Allergies   Allergen Reactions    Ceftriaxone Rash    Iodine Rash    Iv Contrast [Contrast Media] Rash     Itching swelling uable to breathe      Metformin Rash    Ativan [Lorazepam] Other     Heart palpitations      Januvia [Sitagliptin] Other     Sob, chest  pain, heart palpitations      Macrobid [Nitrofurantoin] Unspecified    Tylenol [Acetaminophen] Other     bc of the lesion of the liver.       Prior to Admission Medications   Prescriptions Last Dose Informant Patient Reported? Taking?   Buprenorphine HCl (BELBUCA) 150 MCG FILM 11/20/2016 at 0930  Yes Yes   Sig: Suck 150 mcg in mouth 2 times daily.   Cholecalciferol (VITAMIN D PO) Past Week at Unknown time  Yes Yes   Sig: Take 50,000 capsules by mouth once a week.     DULoxetine (CYMBALTA) 30 MG capsule   Yes No   Sig: Take 30 mg by mouth daily.     HYDROmorphone (DILAUDID) 2 MG tablet 11/20/2016 at 0800  Yes Yes   Sig: Take 2 mg by mouth every 4 hours as needed for Moderate Pain (Pain Score 4-6).   aspirin 81 MG tablet 11/19/2016 at 1500  Yes Yes   Sig: Take 81 mg by mouth daily.   atorvastatin (LIPITOR) 10 MG tablet 11/19/2016 at 2000  Yes Yes   Sig: Take 10 mg by mouth daily.   brimonidine (ALPHAGAN) 0.2 % ophthalmic solution 11/19/2016 at 2200  Yes Yes   Sig: Place 1 drop into both eyes 3 times daily.   carvedilol (COREG) 25 MG tablet 11/20/2016 at 0500  Yes Yes   Sig: Take 25 mg by mouth 2 times daily  (with meals).   clopidogrel (PLAVIX) 75 MG tablet 11/20/2016 at 0500  Yes Yes   Sig: Take 75 mg by mouth daily.   dorzolamide (TRUSOPT) 2 % ophthalmic solution   Yes No   Sig: Place 2 drops into both eyes 3 times daily.   furosemide (LASIX) 20 MG tablet 11/16/2016  Yes No   Sig: Take 20 mg by mouth as needed.   glipiZIDE (GLUCOTROL) 5 MG tablet   Yes No   Sig: Take 5 mg by mouth 2 times daily.   hydroxychloroquine (PLAQUENIL) 200 MG tablet   Yes No   Sig: Take 200 mg by mouth daily.   insulin glargine (LANTUS) 100 UNIT/ML injection   Yes No   Sig: Inject under the skin nightly. Per insulin protocol   latanoprost (XALATAN) 0.005 % ophthalmic solution   Yes No   Sig: Place 1 drop into both eyes every evening.   leflunomide (ARAVA) 20 MG tablet   Yes No   Sig: Take 20 mg by mouth daily.   levothyroxine (SYNTHROID) 100 MCG tablet 11/20/2016 at 0500  Yes Yes   Sig: Take 112 mcg by mouth daily.     nitroGLYcerin 2.5 MG Controlled-Release capsule   Yes No   Sig: Take 2.5 mg by mouth 2 times daily.   ondansetron (ZOFRAN) 8 MG tablet Past Week at Unknown time  Yes Yes   Sig: Take 8 mg by mouth every 8 hours as needed for Nausea/Vomiting.   pantoprazole (PROTONIX) 40 MG tablet 11/19/2016 at 2000  Yes Yes   Sig: Take 40 mg by mouth daily.   senna-docusate (STOOL SOFTENER & LAXATIVE) 8.6-50 MG tablet 11/19/2016 at 1500  Yes Yes   Sig: Take 1 tablet by mouth daily.   zolpidem (AMBIEN) 5 MG tablet 11/19/2016 at 2200  Yes Yes   Sig: Take 5 mg by mouth nightly as needed for Insomnia.      Facility-Administered Medications: None     Last Echocardiogram Results    Lab Results   Component Value Date/Time  LVEF 59(54%-74%)LeftAtrium:4.60 11/16/2016 10:30 AM    IVCD 1.70 11/16/2016 10:30 AM    PAPR 101 11/16/2016 10:30 AM    MMG 18.3 11/16/2016 10:30 AM     Summary:  1. The left ventricular size is normal and the left ventricular systolic function is normal.  2. Prosthetic tissue mitral valve with stenosis and central regurgitation.  3.  Mean inflow gradient of 20 mm Hg across mitral prosthesis.  4. Severe pulmonary hypertension with right ventricular systolic pressure measuring 101 mmHg.  5. No previous study for comparison.    BP 132/81 (BP Location: Right arm, BP Patient Position: Sitting)   Pulse 85   Temp 98.7 F (37.1 C)   Resp 18   Ht 5' 5"  (1.651 m)   Wt 78.1 kg (172 lb 2.9 oz)   SpO2 96%   BMI 28.65 kg/m2    General: A&O x3, No apparent distress.  HEENT: PERRLA, EOMI  Neck: Supple,+ JVD.  Pul: Symmetric expansion of chest, CTAB.  CV: Regular rate and rhythm, normal N8G9. 2/6 diastolic murmur.  Abd: Soft, non-tender, normal apparent bowel sounds.  Ext: Warm and well perfused bilateral lower extremities. No edema.   Neuro: CN II-XII grossly intact. No focal deficits noted.  Skin: no rashes, lesions, erythema, or ecchymosis.    ASA Score:  3    ASA 1: Healthy patient without organic, biochemical, or psychiatric disease   ASA 2: A patient with mild systemic disease. No significant impact on daily activity. Unlikely impact on anesthesia or surgery.   ASA 3: Significant or severe systemic disease that limits normal activity. Significant impact on daily activity.Likely impact on anesthesia or surgery.    ASA 4: Severe disease that is a constant threat to life or requires intensive therapy. Serious limitation of daily activity. Major impact on anesthesia and surgery.    ASA 5: Moribund patient who is likely to die in the next 24 hours with or without surgery.    ASA 6: Brain-dead organ donor.     Airway (Mallimpati) Score:  Class II - Soft palate, uvula, and fauces are visible.   Class I: Soft palate, uvula, fauces, and pillars are visible   Class II: Soft palate, uvula, and fauces are visible   Class III: Soft palate and base of the uvula are visible    Class IV: Only the hard palate visible      Assessment and Plan- Proceed to planned procedure. Moderate sedation is planned for this procedure.    CSHA Clinical Frailty Scale:  Moderately Frail   Very Fit: People who are robust, active, energetic and motivated. These people commonly exercise regularly. They are among the fittest for their age.   Well: People who have no active disease symptoms but are less fit than category 1. Often, they exercise or are very active occasionally, e.g. seasonally.   Managing Well: People whose medical problems are well controlled, but are not regularly active beyond routine walking.   Vulnerable: While not dependent on others for daily help, often symptoms limit activities. A common complaint is being "slowed up", and/or being tired during the day.   Mildly Frail: These people often have more evident slowing, and need help in high order IADLs (finances, transportation, heavy housework, medications). Typically, mild frailty progressively impairs shopping and walking outside alone, meal preparation and housework.   Moderately Frail: People need help with all outside activities and with keeping house. Inside, they often have problems with stairs and need help with bathing and  might need minimal assistance (cuing, standby) with dressing.   Severely Frail: Completely dependent for personal care, from whatever cause (physical or cognitive). Even so, they seem stable and not at high risk of dying (within ~ 6 months).   Very Severely Frail: Completely dependent, approaching the end of life. Typically, they could not recover even from a minor illness.   Terminally Ill: Approaching the end of life. This category applies to people with a life expectancy <6 months, who are not otherwise evidently frail.    Labs pending this AM  Results for orders placed or performed in visit on 08/30/16   CBC w/ Diff [place]   Result Value Ref Range    WBC 7.7 4.0 - 10.0 1000/mm3    RBC 4.27 3.90 - 5.20 mill/mm3    Hgb 12.5 11.2 - 15.7 gm/dL    Hct 38.2 34.0 - 45.0 %    MCV 89.5 79.0 - 95.0 um3    MCH 29.3 26.0 - 32.0 pgm    MCHC 32.7 32.0 - 36.0 g/dL    RDW 14.8 (H) 12.0 -  14.0 %    MPV 10.4 9.4 - 12.4 fL    Plt Count 283 140 - 370 1000/mm3    Segs 71 %    Lymphocytes 14 %    Monocytes 7 %    Eosinophils 5 %    ANC-Manual Mode 5.7 1.6 - 7.0 1000/mm3    Abs Lymphs 1.1 0.8 - 3.1 1000/mm3    Abs Monos 0.5 0.2 - 0.8 1000/mm3    Abs Eosinophils 0.4 <0.1 - 0.5 1000/mm3    Diff Type Manual    Comprehensive Metabolic Panel Green Plasma Separator Tube   Result Value Ref Range    Glucose 160 (H) 70 - 99 mg/dL    BUN 13 8 - 23 mg/dL    Creatinine 0.63 0.51 - 0.95 mg/dL    GFR >60 mL/min    Sodium 137 136 - 145 mmol/L    Potassium 4.0 3.5 - 5.1 mmol/L    Chloride 98 98 - 107 mmol/L    Bicarbonate 26 22 - 29 mmol/L    Anion Gap 13 7 - 15 mmol/L    Calcium 9.6 8.5 - 10.6 mg/dL    Total Protein 7.7 6.0 - 8.0 g/dL    Albumin 4.3 3.5 - 5.2 g/dL    Bilirubin, Tot 0.50 <1.2 mg/dL    AST (SGOT) 13 0 - 32 U/L    ALT (SGPT) <5 0 - 33 U/L    Alkaline Phos 94 35 - 140 U/L   Results for orders placed or performed during the hospital encounter of 10/09/14   Glycosylated Hgb(A1C), Blood [place]   Result Value Ref Range    Glyco Hgb (A1C) 7.9 (H) 4.8 - 5.9 %       Discussed known risks associated with this procedure, which include but are not limited to minor risks of bleeding, infection, arrhythmia requiring cardioversion or defibrillation, transient kidney failure, vascular access site complication and major risks including heart attack, stroke, emergency open heart surgery, and death.      Patient understands these risks, and proceeds to sign the consent form.  Patient seen and discussed with the attending physician, Dr. Parke Poisson.    The patient has consented to the procedure, which will be done with sedation.  I have assessed the patient's status immediately prior to this procedure.  I have discussed pain management needs and options for the patient with the patient  or caregiver.      The patient agrees to be full code for the duration of the procedure.    Sedation options, risks, and plans have been  discussed with the patient or caregiver.  Questions were answered.  The patient or caregiver agrees to proceed as planned.    Planned access: IJ      Cherlyn Labella. Theodis Shove, [place], PA-C  Interventional Cardiology  (O) [phone]  [phone]

## 2016-11-20 NOTE — Interdisciplinary (Signed)
Sent lab now

## 2016-11-21 ENCOUNTER — Telehealth (HOSPITAL_COMMUNITY): Payer: Self-pay

## 2016-11-21 DIAGNOSIS — I350 Nonrheumatic aortic (valve) stenosis: Secondary | ICD-10-CM

## 2016-11-21 DIAGNOSIS — Z91041 Radiographic dye allergy status: Secondary | ICD-10-CM

## 2016-11-22 ENCOUNTER — Telehealth (HOSPITAL_COMMUNITY): Payer: Self-pay

## 2016-11-22 MED ORDER — PREDNISONE 50 MG OR TABS
50.0000 mg | ORAL_TABLET | Freq: Two times a day (BID) | ORAL | 0 refills | Status: AC
Start: 2016-11-22 — End: 2016-11-24

## 2016-11-22 NOTE — Telephone Encounter (Signed)
Instructions emailed to patient's son [name]    1. CT ANGIOGRAM APPOINTMENT   DATE: 11/27/16  Location:  [facility]   [place], [place] [address]  (1st Floor/Radiology Department)  Direct Number: [phone]    INSTRUCTIONS:   Check in: 9:10am  Procedure time: 9:40am  Allow roughly an hour for the scan  Nothing to eat for FOUR hours prior to the CT scan (If 9:00 AM Appt. Nothing after 5:40 AM)  You may take morning medications with a sip of water the day of the CT scan.    IF TAKING METFORMIN FOR DIABETES: Hold 24 hours before the scan, and hold after for 48 hours  NO CAFFEINE (includes DECAF coffee, regular coffee, tea, soda, chocolate or other caffeinated items, NO Excedrin)  Drink plenty of water in the 24 hours prior to the scan  No exercise on day of scan     CONTRAST ALLERGIES:  Per conversation with Lavonia Drafts, NP: please take 50 mg Benadryl 1 hour before your procedure since you are unable to take prednisone/      2. Pre-Op Cardiothoracic Surgeons:  Date: 12/11/16  Time: Dr Almond Lint at [place]  Location:   [facility] Floor  [address]   [place], [place] [address]  [phone]  ?      Please contact Lavonia Drafts with any questions    Lavonia Drafts, MSN, ANP-BC   Interventional Cardiology/Structural Heart Nurse Practitioner  [facility] cardiovascular [place]   T: [phone]  F: [phone]  Email: [email or web address]

## 2016-11-23 ENCOUNTER — Telehealth (HOSPITAL_BASED_OUTPATIENT_CLINIC_OR_DEPARTMENT_OTHER): Payer: Self-pay

## 2016-11-23 NOTE — Telephone Encounter (Signed)
11/23/16: 11:23am/  Patient called back, confirmed appt/prep instructions given.  Check in at [place] AM.Monday (11/27/16) for CTA Coronary Artery CT Scan.  Pt aware of her pre-meds for her contrast allergy.  Pt states that she takes only Benadryl 50 mgs PO at night and another Benadryl 50 mgs PO the morning of the procedure ( 1 hour prior to the CT Scan appt).  Pt states that her doctor told her no prednisone needed.  Pt verbalized understanding.    [facility] (CT Scan)  [phone]

## 2016-11-23 NOTE — Telephone Encounter (Signed)
11/23/16: 11:00am// Called patient but no answer.  Left voice message to call us back at [phone].   RE: CTA Coronary Artery CT Scan on Monday (11/27/16) at 09:40AM     Please remind patient of the following:  1.  No solid food 4 hours  prior to CT Scan appointment.  2.  Drink 1 liter of water prior to arriving for CT Scan.  3.  No coffee, tea, chocolates or caffeinated beverages  24 hours prior to CT Scan.  4.  Take all your normal medications the morning of the procedure, or any medications prescribed for procedure ( Pre-allergy meds ex. Benadryl/Prednisone).  5.  Do not take any Viagra, Cialis or Levitra, 48 hours prior to your CT Scan.  6.   Arrive 1 hour and 15 minutes early to your CT Scan.  7.  Check in 0825AM at [facility] Radiology Dept. (CT Scan).    Lana Fish, [place] [facility] Dept. (CT Scan)  [phone]      .

## 2016-11-27 ENCOUNTER — Ambulatory Visit
Admission: RE | Admit: 2016-11-27 | Discharge: 2016-11-27 | Disposition: A | Discharge status: Home / Self Care | Payer: Medicaid Other | Attending: Body Imaging | Admitting: Body Imaging

## 2016-11-27 ENCOUNTER — Ambulatory Visit (HOSPITAL_BASED_OUTPATIENT_CLINIC_OR_DEPARTMENT_OTHER): Admit: 2016-11-27 | Discharge: 2016-11-27 | Disposition: A | Discharge status: Home Health | Payer: Medicaid Other

## 2016-11-27 DIAGNOSIS — I050 Rheumatic mitral stenosis: Secondary | ICD-10-CM

## 2016-11-27 DIAGNOSIS — Z01818 Encounter for other preprocedural examination: Secondary | ICD-10-CM

## 2016-11-27 DIAGNOSIS — R590 Localized enlarged lymph nodes: Secondary | ICD-10-CM | POA: Insufficient documentation

## 2016-11-27 DIAGNOSIS — Z952 Presence of prosthetic heart valve: Secondary | ICD-10-CM | POA: Insufficient documentation

## 2016-11-27 DIAGNOSIS — Q245 Malformation of coronary vessels: Secondary | ICD-10-CM | POA: Insufficient documentation

## 2016-11-27 DIAGNOSIS — I700 Atherosclerosis of aorta: Secondary | ICD-10-CM

## 2016-11-27 DIAGNOSIS — I342 Nonrheumatic mitral (valve) stenosis: Secondary | ICD-10-CM

## 2016-11-27 MED ORDER — IODIXANOL 320 MG/ML IV SOLN
100.0000 mL | Freq: Once | INTRAVENOUS | Status: AC
Start: 2016-11-27 — End: 2016-11-27
  Administered 2016-11-27: 100 mL via INTRAVENOUS
  Filled 2016-11-27: qty 100

## 2016-11-28 ENCOUNTER — Telehealth (HOSPITAL_BASED_OUTPATIENT_CLINIC_OR_DEPARTMENT_OTHER): Payer: Self-pay | Admitting: Hepatology

## 2016-11-28 NOTE — Telephone Encounter (Signed)
Pt calling to make Dr. Marykay Lex aware that on November 6 she will have heart surgery by [place] surgeon. Says she may be in recovery for a couple of weeks to a month. She is having her mitral valve replaced. In addition, she was not sure if Dr. Marykay Lex was aware that she has pulmonary hypertension.    Pt rescheduled her appt from 11/14 to 2/27 with Dr. Marykay Lex. Appt was added to Hepatology cancellation wait list.    Pt plans on scheduling MRI to be done after her recovery.     Routing for awareness.

## 2016-12-05 ENCOUNTER — Telehealth (HOSPITAL_COMMUNITY): Payer: Self-pay | Admitting: Nurse Practitioner

## 2016-12-05 ENCOUNTER — Other Ambulatory Visit: Payer: Medicaid Other | Attending: Nurse Practitioner | Admitting: Nurse Practitioner

## 2016-12-05 DIAGNOSIS — I342 Nonrheumatic mitral (valve) stenosis: Secondary | ICD-10-CM

## 2016-12-05 DIAGNOSIS — Z01818 Encounter for other preprocedural examination: Secondary | ICD-10-CM

## 2016-12-05 NOTE — Telephone Encounter (Signed)
Called the patient with appointments for transcatheter mitral valve replacement. Will email to the son as well. Patient aware the heart team will review her complete case and I will call with any additional information and/or change of plans.   Patient given the following instructions:   Patient with contrast dye allergy: refuses prednisone due to intolerance. She took benadryl 50 mg night before and day of the cardiac cath and her CTA scans and had no reaction. So will repeat that for the TAVR   Patient states she remains SOB and fatigued, but feels that she is the same. She does believe her weight is up, advised to take the lasix for a couple days and see if that makes a difference.     1. Pre-Op Anesthesia:  Date: 12/18/16  Time:12:15 PM   Location:  [facility]   [address]    [place], [place] [address]  [phone]    2. Pre-Op Cardiothoracic Surgeon: Dr. Almond Lint   Date:12/18/16  Time: 4:45 PM   Location:   [facility]-First Floor  [address]   [place], [place] [address]  [phone]      PROCEDURE: TAVR procedure  Date:12/26/16  [id number] Arrive 0530   Location:   [facility])   Please proceed to 2nd floor Reception desk  [phone] Dr.  [place], [place] [address]    PATIENT INSTRUCTIONS:   - Do not eat or drink after midnight the night before the procedure.  - You may take regular/routine morning medications with sip of water   - Please bring a current list of Medications.   + contrast allergy: Benadryl 50 mg night before and day of the procedure with sip of water.   - It is important to shower or bathe the morning of procedure and wear comfortable, loose fitting clothes.   -Expect to stay in the hospital with [place] for at least 3-4 days or longer if needed.    Please call for any questions Lavonia Drafts NP [phone]

## 2016-12-06 ENCOUNTER — Encounter (INDEPENDENT_AMBULATORY_CARE_PROVIDER_SITE_OTHER): Payer: Medicaid Other | Admitting: Hepatology

## 2016-12-11 ENCOUNTER — Ambulatory Visit (HOSPITAL_COMMUNITY): Payer: Medicaid Other | Admitting: Thoracic Surgery (Cardiothoracic Vascular Surgery)

## 2016-12-12 ENCOUNTER — Other Ambulatory Visit (HOSPITAL_COMMUNITY): Payer: Self-pay | Admitting: Thoracic Surgery (Cardiothoracic Vascular Surgery)

## 2016-12-12 LAB — EMMI , PATIENT SATISFACTION: HOSPITAL DISCHARGE EXPECTATIONS: EMMI Video Order Number: 19036155745

## 2016-12-13 ENCOUNTER — Encounter (INDEPENDENT_AMBULATORY_CARE_PROVIDER_SITE_OTHER): Payer: Medicaid Other | Admitting: Hepatology

## 2016-12-18 ENCOUNTER — Ambulatory Visit (INDEPENDENT_AMBULATORY_CARE_PROVIDER_SITE_OTHER): Payer: Medicaid Other | Admitting: Anesthesiology

## 2016-12-18 ENCOUNTER — Ambulatory Visit
Payer: Medicaid Other | Attending: Thoracic Surgery (Cardiothoracic Vascular Surgery) | Admitting: Thoracic Surgery (Cardiothoracic Vascular Surgery)

## 2016-12-18 ENCOUNTER — Other Ambulatory Visit (INDEPENDENT_AMBULATORY_CARE_PROVIDER_SITE_OTHER): Payer: Medicaid Other | Attending: Nurse Practitioner

## 2016-12-18 VITALS — BP 134/73 | HR 77 | Temp 97.5°F | Resp 16 | Ht 63.0 in | Wt 174.6 lb

## 2016-12-18 VITALS — BP 116/77 | HR 74 | Temp 97.7°F | Resp 16 | Ht 63.75 in | Wt 173.3 lb

## 2016-12-18 DIAGNOSIS — I272 Pulmonary hypertension, unspecified: Secondary | ICD-10-CM | POA: Insufficient documentation

## 2016-12-18 DIAGNOSIS — I342 Nonrheumatic mitral (valve) stenosis: Secondary | ICD-10-CM

## 2016-12-18 DIAGNOSIS — Z01818 Encounter for other preprocedural examination: Secondary | ICD-10-CM | POA: Insufficient documentation

## 2016-12-18 DIAGNOSIS — I350 Nonrheumatic aortic (valve) stenosis: Secondary | ICD-10-CM | POA: Insufficient documentation

## 2016-12-18 LAB — CBC WITH DIFF, BLOOD
ANC-Automated: 5.2 10*3/uL (ref 1.6–7.0)
Abs Basophils: 0.1 10*3/uL (ref <0.1)
Abs Eosinophils: 0.3 10*3/uL (ref 0.1–0.5)
Abs Lymphs: 1.5 10*3/uL (ref 0.8–3.1)
Abs Monos: 0.9 10*3/uL — ABNORMAL HIGH (ref 0.2–0.8)
Basophils: 1 %
Eosinophils: 4 %
Hct: 41 % (ref 34.0–45.0)
Hgb: 13.3 gm/dL (ref 11.2–15.7)
Lymphocytes: 19 %
MCH: 27 pg (ref 26.0–32.0)
MCHC: 32.4 g/dL (ref 32.0–36.0)
MCV: 83.3 um3 (ref 79.0–95.0)
MPV: 10.6 fL (ref 9.4–12.4)
Monocytes: 11 %
Plt Count: 254 10*3/uL (ref 140–370)
RBC: 4.92 10*6/uL (ref 3.90–5.20)
RDW: 15.4 % — ABNORMAL HIGH (ref 12.0–14.0)
Segs: 65 %
WBC: 8 10*3/uL (ref 4.0–10.0)

## 2016-12-18 LAB — URINALYSIS WITH CULTURE REFLEX, WHEN INDICATED
Bilirubin: NEGATIVE
Blood: NEGATIVE
Glucose: NEGATIVE
Ketones: NEGATIVE
Leuk Esterase: NEGATIVE
Nitrite: NEGATIVE
Specific Gravity: 1.027 (ref 1.002–1.030)
Urobilinogen: NEGATIVE
pH: 6 (ref 5.0–8.0)

## 2016-12-18 LAB — COMPREHENSIVE METABOLIC PANEL, BLOOD
ALT (SGPT): <5 U/L (ref 0–33)
AST (SGOT): 9 U/L (ref 0–32)
Albumin: 4.5 g/dL (ref 3.5–5.2)
Alkaline Phos: 100 U/L (ref 35–140)
Anion Gap: 13 mmol/L (ref 7–15)
BUN: 14 mg/dL (ref 8–23)
Bicarbonate: 25 mmol/L (ref 22–29)
Bilirubin, Tot: 0.6 mg/dL (ref <1.2)
Calcium: 9.5 mg/dL (ref 8.5–10.6)
Chloride: 101 mmol/L (ref 98–107)
Creatinine: 0.64 mg/dL (ref 0.51–0.95)
GFR: >60 mL/min
Glucose: 154 mg/dL — ABNORMAL HIGH (ref 70–99)
Potassium: 4.8 mmol/L (ref 3.5–5.1)
Sodium: 139 mmol/L (ref 136–145)
Total Protein: 7.8 g/dL (ref 6.0–8.0)

## 2016-12-18 LAB — PROTHROMBIN TIME, BLOOD
INR: 1.2
PT,Patient: 12.7 s — ABNORMAL HIGH (ref 9.7–12.5)

## 2016-12-18 MED ORDER — LATANOPROST 0.005 % OP SOLN: 1.00 [drp] | Freq: Every evening | OPHTHALMIC | Status: AC

## 2016-12-18 NOTE — Progress Notes (Signed)
Asked to evaluate the patient for the possible surgical treatment of severe mitral stenosis and regurgitation of the bioprosthetic mitral valve.  Patient with multiple medical problems including severe pulmonary hypertension who in my opinion is not a candidate for the re-do mitral surgery.   I think that she would be best served by mitral valve in valve transcatheter replacement.

## 2016-12-18 NOTE — Patient Instructions (Addendum)
PREOPERATIVE SURGICAL INFORMATION    Your surgery is currently scheduled at [facility] on 12/26/16  With a planned report time of [place], [address] [place], [place] [address]   Please check in at the Admissions Desk 2nd floor    QUESTIONS  If you have any questions between now and the day of your surgery, please do not hesitate to call:   [place]: [phone]      DAY OF SURGERY ARRIVAL TIME:  On the day of your Surgery/Procedure, please arrive at the time provided by the surgery/preop team.  If you have any questions regarding your arrival time, please call:   [place] at [facility]: [place] 7 DAYS BEFORE SURGERY/PROCEDURE:   PLEASE HOLD ASPIRIN AND ALL NSAIDS (non-steroidal anti-inflammatory drugs) SUCH AS advil, aleve, motrin, ibuprofen, relafen, lodine, feldene, Diclofenac, voltaren, indomethacin, naproxen, celebrex, Mobic.    Please hold vitamins, supplements, herbs & fish oil.    REGULAR PRESCRIPTION MEDICATIONS:   Regular prescription medications should be taken the day of surgery with sips of water   AFTER YOUR VISIT WITH [place], IF YOU START TAKING A NEW MEDICATION BEFORE SURGERY, PLEASE CALL us TO MAKE SURE IT IS SAFE TO TAKE & WON'T EFFECT YOUR SURGERY.        TO DO LIST:             EATING/DRINKING   PLEASE DO NOT EAT OR DRINK ANYTHING AFTER MIDNIGHT THE NIGHT BEFORE SURGERY.      Preparing for your Surgery:   Please wear clean loose-fitting clothes and leave valuables at home   Bring a picture ID and your insurance card, and be prepared to pay your deductible or co-insurance by cash, check, or credit card when you arrive.   If you are going home after your surgery, please make sure to arrange for an adult to drive you home.  If you do not do so, your surgery may be cancelled.      If  you are a woman of child bearing age, please note that you may be asked to give a urine sample upon checkin.    On The Day of Your Surgery:    Check in at the location mentioned above   You will meet your anesthesia and surgery teams before surgery.   Once surgery is over, you will wake up in the recovery room where you will be able to see your friends/family.   Once your time in the recovery room is complete, you will either go home or be admitted as planned.   If you go home, someone will need to stay with you for the first 24 hours after surgery.     Additional information about what to expect before & after surgery is available online at:  [email or web address]    Or by searching You-tube for [place] before surgery and [place] after surgery    You medical records are available to you at [email or web address]  Select create account.

## 2016-12-18 NOTE — Interdisciplinary (Signed)
Pt is eligible for 48/72 HOUR RULE EXCEPTION BLOOD BANK COMPATILBILITY SPECIMEN, paper was submitted to the lab.

## 2016-12-18 NOTE — Anesthesia Preprocedure Evaluation (Addendum)
ANESTHESIA PRE-OPERATIVE EVALUATION    Patient Information    Name: Cathy Lucas    MRN: [id number]    DOB: 03/26/1950    Age: 65 year old    Sex: female  Procedure(s):  REPLACEMENT, VALVE, AORTIC, TRANSCATHETER      Pre-op Vitals:   There were no vitals taken for this visit.        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  She is scheduled for REPLACEMENT, VALVE, AORTIC, TRANSCATHETER.    General:  not able to climb flight of stairs//Exercise tolaerance <4 mets,  Functional capacity limited due to DOE and deconditioning Cardiovascular:  RV Failure/Pulmonary Hypertension, Unstable/PASP > 40,   valvular problems/murmurs, MR, severe/critical,   hypertension,  Echo 06/28/2016:  LVEF of 59%, No evidence of LVH or LV dysfunction,  Aortic Sclerosis without stenosis,  Intact mitral valve prior prosthetic valve, moderate mitral regurgitation.  Moderate tricuspid regurgitation.  Severe pulmonary hypertension with RVSP 81.  Since previous echo 06/30/2015 mild MR/TR progressed to moderate.  Mild pulmonary hypertension had progressed to severe with RVSP increased from 40-81 mmHG    Cardiac Cath 09/12/2016 showing Markedly elevated right heart pressures wth a PA pressure of 112/18, mean 56 mmHG, PAW 26 mmHg, PVR 8 Wood units, LVEF 59%, 3+ bioprosthetic mitral regurgitation, and normal coronary arteries.    CTA coronary 11/27/16: No obstructive coronary artery disease.    CT chest 11/27/16: Several prominent and mildly enlarged mediastinal and bilateral hilar lymph nodes, some of which are calcified      Patient complains of Chest tightness with exertion and at rest, SOB, DOE after walking 30 feet, + lightheadedness with and without  hypoglycemia.     Has never seen a pulmonary specialist for her pHTN   Anesthesia History:  no history of anesthetic complications,  chronic pain patient (On Buprenorphine 150 mcg buccal film BID and Dilaudid 2mg (taking up to six tablets daily)),  no family history of anesthetic complications,   Pulmonary:   recent URI/pneumonia,     Neuro/Psych:   psychiatric history,  Amaurosis Fugax right eye last occurrence was about 6 months ago before plavix and she has had no further issues on plavix Hematology/Oncology:       GI/Hepatic:  GERD,  liver disease (Hepatic nodule, benign neoplasm. biopsy proven NASH with fibrosis),   Infectious Disease:  hepatitis,     Renal:  negative renal ROS   Endocrine/Other:  diabetes,  arthritis (RA),   back pain (Lumbar spine degeneration),  Endocrinology is currently working her up for hypoglycemia which wakes her in the night with BG in the 60-70's at which time she eats something and it recovers.  She reports BG initially as low as the high 20's, there is concern for a pancreatic tumor, seeing Dr. Bruce.   Pregnancy History:   Pediatrics:         Pre Anesthesia Testing (PCC/CPC) notes/comments:    PCC Test & records reviewed by PCC Provider.                      CV surgery team given the following recommendations:   -Pre-op pulmonary HTN clinic referral for full eval/tx  -Perioperative pain consult   recommended  -Perioperative endocrinology consult for initiation workup             Physical Exam    Airway:  Inter-inciser distance 3-4 cm  Prognanth Unable    Mallampati: II  Neck ROM: full  TM distance: 4-5 cm  Short thick neck: No        Cardiovascular:    Rhythm: regular   Rate: normal   Positive for murmur      Pulmonary:       breath sounds clear to auscultation        Neuro/Neck/Skeletal/Skin:  - [facility] ANE PHYS EXAM NEGATIVE ROS SKIN SKELETAL NEURO NECK          Dental:        Abdominal:      General: normal weight   Abdomen: soft.     Additional Clinical Notes:               Last  OSA  (STOP BANG) Score:  No Data Recorded    Last OSA  (STOP) Score for   No Data Recorded                 Past Medical History:   Diagnosis Date   • Back pain     Needs L4 fusion   • Depression    • Diabetes (CMS-HCC)    • Fibromyalgia    • GERD (gastroesophageal reflux disease)    • Glaucoma    • Hypercholesterolemia    • Hypertension    • Hypothyroidism    • Migraines    • PAF (paroxysmal atrial fibrillation) (CMS-HCC)      Past Surgical History:   Procedure Laterality Date   • BILE DUCT STENT PLACEMENT     • CHOLECYSTECTOMY     • COLONOSCOPY  05/31/2012    normal mucosa with internal hemorrhoids.    • ESOPHAGOGASTRODUODENOSCOPY  03/11/2015   • HYSTERECTOMY     • LIVER BIOPSY  05/22/2015    steatohepatitis with periportal and focal septal fibrosis(stage 2-3 of 4). Small focus of benign epithelial lining suggestive of benign cyst, with adjacent chronic inflammation, reactive fibrosis, and hemosiderin depostition B. Liver asparte fluid: hypocellular specimen. Rare benign hepatocysts and macrophages. No malignant cells identified.   • MITRAL VALVE REPAIR     • TONSILLECTOMY AND ADENOIDECTOMY       Social History   Substance Use Topics   • Smoking status: Never Smoker   • Smokeless tobacco: Never Used   • Alcohol use No       Current Outpatient Prescriptions   Medication Sig Dispense Refill   • aspirin 81 MG tablet Take 81 mg by mouth daily.     • atorvastatin (LIPITOR) 10 MG tablet Take 10 mg by mouth daily.     • brimonidine (ALPHAGAN) 0.2 % ophthalmic solution Place 1 drop into both eyes 3 times daily.     • Buprenorphine HCl (BELBUCA) 150 MCG FILM Suck 150 mcg in mouth 2 times daily.     • carvedilol (COREG) 25 MG tablet Take 25 mg by mouth 2 times daily (with meals).     • Cholecalciferol (VITAMIN D PO) Take 50,000 capsules by mouth once a week.       • clopidogrel (PLAVIX) 75 MG tablet Take 75 mg by mouth daily.     • furosemide (LASIX) 20 MG tablet Take 20 mg by mouth as needed.     • HYDROmorphone (DILAUDID) 2 MG    tablet Take 2 mg by mouth every 4 hours as needed for Moderate Pain (Pain Score 4-6).      levothyroxine (SYNTHROID) 100 MCG tablet Take 112 mcg by mouth daily.        nitroGLYcerin 2.5 MG Controlled-Release capsule Take 2.5 mg by mouth 2 times daily.      ondansetron (ZOFRAN) 8 MG tablet Take 8 mg by mouth every 8 hours as needed for Nausea/Vomiting.      pantoprazole (PROTONIX) 40 MG tablet Take 40 mg by mouth daily.      senna-docusate (STOOL SOFTENER & LAXATIVE) 8.6-50 MG tablet Take 1 tablet by mouth daily.      zolpidem (AMBIEN) 5 MG tablet Take 5 mg by mouth nightly as needed for Insomnia.       No current facility-administered medications for this visit.      Allergies   Allergen Reactions    Ceftriaxone Rash    Iodine Rash    Iv Contrast [Contrast Media] Anaphylaxis     Itching swelling unable to breathe      Metformin Rash    Ativan [Lorazepam] Other     Heart palpitations      Januvia [Sitagliptin] Other     Sob, chest pain, heart palpitations      Macrobid [Nitrofurantoin] Unspecified    Tylenol [Acetaminophen] Other     bc of the lesion of the liver.       Labs and Other Data  Lab Results   Component Value Date    NA 139 11/20/2016    K 3.9 11/20/2016    CL 103 11/20/2016    BICARB 21 (L) 11/20/2016    BUN 11 11/20/2016    CREAT 0.63 11/20/2016    GLU 134 (H) 11/20/2016    [place] 9.0 11/20/2016     Lab Results   Component Value Date    AST 13 08/30/2016    ALT <5 08/30/2016    ALK 94 08/30/2016    TP 7.7 08/30/2016    ALB 4.3 08/30/2016    TBILI 0.50 08/30/2016    DBILI <0.2 08/30/2016     Lab Results   Component Value Date    WBC 6.9 11/20/2016    RBC 4.41 11/20/2016    HGB 12.2 11/20/2016    HCT 37.3 11/20/2016    MCV 84.6 11/20/2016    MCHC 32.7 11/20/2016    RDW 14.3 (H) 11/20/2016    PLT 201 11/20/2016    MPV 10.5 11/20/2016    SEG 71 08/30/2016    LYMPHS 14 08/30/2016    MONOS 7 08/30/2016    EOS 5 08/30/2016     Lab Results   Component Value Date    INR 1.1 08/30/2016    PTT 28.3  10/10/2014     No results found for: ARTPH, ARTPO2, ARTPCO2    Anesthesia Plan:  Risks and Benefits of Anesthesia  I personally examined the patient immediately prior to the anesthetic and reviewed the pertinent medical history, drug and allergy history, laboratory and imaging studies and consultations. I have determined that the patient has had adequate assessment and testing.    Anesthetic techniques, invasive monitors, anesthetic drugs for induction, maintenance and post-operative analgesia, risks and alternatives have been explained to the patient and/or patient's representatives.    I have prescribed the anesthetic plan:         Planned anesthesia method: General         ASA 3 (Severe systemic disease)  Potential anesthesia problems identified and risks including but not limited to the following were discussed with patient and/or patient's representative: Adverse or allergic drug reaction, Administration of blood products, Ocular injury, Dental injury or sore throat and Injury to brain, heart and other organs    Beta Blocker Indicated: Planned monitoring method: Routine monitoring and Arterial line monitoring    Informed Consent:  Anesthetic plan and risks discussed with Patient.  Use of blood products discussed with patient whom.             CV surgery team given the following recommendations:   -Pre-op pulmonary HTN clinic referral for full eval/tx  -Perioperative pain consult recommended  -Perioperative endocrinology consult for initiation workup      Cherre Robins, MD -1 (PGY-2)  Pager [phone]

## 2016-12-19 LAB — MRSA SURVEILLANCE CULTURE

## 2016-12-19 LAB — TYPE & SCREEN
ABO/RH: O POS
Antibody Screen: NEGATIVE

## 2016-12-25 ENCOUNTER — Telehealth (HOSPITAL_COMMUNITY): Payer: Self-pay | Admitting: Nurse Practitioner

## 2016-12-25 NOTE — Telephone Encounter (Signed)
Called patient for quick follow up before her valve in valve TMVR procedure tomorrow.   She has contrast allergy but does not tolerate or want to take the prednisone. She took benadryl 50 MG BID starting the day before both her cardiac cath and CTA's and had absolutely no issues with the contrast. So will repeat the benadryl again before the TAVR   Benadryl 50 mg BID starting today and to take 50 in AM before coming in.   She has remaining instructions.  Spoke to the son, she had a root canel done two months, had temporary put in, it fell out and she is going to the dentist at 4 PM. Son denies any infection, but will contact me via [facility] after appointment completed. Told him we need a clearance from the dentist that there is no infection. He verbalized understanding, and will get clearance and e-mail me with issues. Otherwise, no changes in medical status   Lavonia Drafts NP

## 2016-12-26 ENCOUNTER — Inpatient Hospital Stay (HOSPITAL_BASED_OUTPATIENT_CLINIC_OR_DEPARTMENT_OTHER): Payer: Medicaid Other

## 2016-12-26 ENCOUNTER — Ambulatory Visit: Payer: Medicaid Other | Attending: Anesthesiology | Admitting: Interventional Pain Management

## 2016-12-26 ENCOUNTER — Inpatient Hospital Stay
Admission: RE | Admit: 2016-12-26 | Discharge: 2016-12-27 | DRG: 267 | Disposition: A | Discharge status: Home / Self Care | Payer: Medicaid Other | Attending: Interventional Cardiology | Admitting: Interventional Cardiology

## 2016-12-26 ENCOUNTER — Encounter (HOSPITAL_COMMUNITY)
Admission: RE | Disposition: A | Discharge status: Home Health | Payer: Self-pay | Attending: Thoracic Surgery (Cardiothoracic Vascular Surgery)

## 2016-12-26 ENCOUNTER — Ambulatory Visit (HOSPITAL_COMMUNITY): Payer: Medicaid Other

## 2016-12-26 ENCOUNTER — Ambulatory Visit (HOSPITAL_COMMUNITY): Payer: Medicaid Other | Admitting: Anesthesiology

## 2016-12-26 DIAGNOSIS — Z888 Allergy status to other drugs, medicaments and biological substances status: Secondary | ICD-10-CM

## 2016-12-26 DIAGNOSIS — K219 Gastro-esophageal reflux disease without esophagitis: Secondary | ICD-10-CM | POA: Diagnosis present

## 2016-12-26 DIAGNOSIS — E119 Type 2 diabetes mellitus without complications: Secondary | ICD-10-CM | POA: Diagnosis present

## 2016-12-26 DIAGNOSIS — E039 Hypothyroidism, unspecified: Secondary | ICD-10-CM | POA: Diagnosis present

## 2016-12-26 DIAGNOSIS — Z7982 Long term (current) use of aspirin: Secondary | ICD-10-CM

## 2016-12-26 DIAGNOSIS — G43909 Migraine, unspecified, not intractable, without status migrainosus: Secondary | ICD-10-CM | POA: Diagnosis present

## 2016-12-26 DIAGNOSIS — E7800 Pure hypercholesterolemia, unspecified: Secondary | ICD-10-CM | POA: Diagnosis present

## 2016-12-26 DIAGNOSIS — Z9889 Other specified postprocedural states: Secondary | ICD-10-CM | POA: Diagnosis not present

## 2016-12-26 DIAGNOSIS — I110 Hypertensive heart disease with heart failure: Secondary | ICD-10-CM | POA: Diagnosis present

## 2016-12-26 DIAGNOSIS — M797 Fibromyalgia: Secondary | ICD-10-CM | POA: Diagnosis present

## 2016-12-26 DIAGNOSIS — Z9049 Acquired absence of other specified parts of digestive tract: Secondary | ICD-10-CM

## 2016-12-26 DIAGNOSIS — T82857A Stenosis of cardiac prosthetic devices, implants and grafts, initial encounter: Secondary | ICD-10-CM

## 2016-12-26 DIAGNOSIS — Z90710 Acquired absence of both cervix and uterus: Secondary | ICD-10-CM

## 2016-12-26 DIAGNOSIS — Z823 Family history of stroke: Secondary | ICD-10-CM

## 2016-12-26 DIAGNOSIS — Z952 Presence of prosthetic heart valve: Secondary | ICD-10-CM

## 2016-12-26 DIAGNOSIS — I050 Rheumatic mitral stenosis: Secondary | ICD-10-CM | POA: Diagnosis present

## 2016-12-26 DIAGNOSIS — I272 Pulmonary hypertension, unspecified: Secondary | ICD-10-CM | POA: Diagnosis present

## 2016-12-26 DIAGNOSIS — I480 Paroxysmal atrial fibrillation: Secondary | ICD-10-CM | POA: Diagnosis present

## 2016-12-26 DIAGNOSIS — I50810 Right heart failure, unspecified: Secondary | ICD-10-CM | POA: Diagnosis present

## 2016-12-26 DIAGNOSIS — Z91040 Latex allergy status: Secondary | ICD-10-CM

## 2016-12-26 DIAGNOSIS — Z91041 Radiographic dye allergy status: Secondary | ICD-10-CM

## 2016-12-26 DIAGNOSIS — H409 Unspecified glaucoma: Secondary | ICD-10-CM | POA: Diagnosis present

## 2016-12-26 DIAGNOSIS — F329 Major depressive disorder, single episode, unspecified: Secondary | ICD-10-CM | POA: Diagnosis present

## 2016-12-26 DIAGNOSIS — I052 Rheumatic mitral stenosis with insufficiency: Principal | ICD-10-CM | POA: Diagnosis present

## 2016-12-26 DIAGNOSIS — J984 Other disorders of lung: Secondary | ICD-10-CM

## 2016-12-26 DIAGNOSIS — Z8249 Family history of ischemic heart disease and other diseases of the circulatory system: Secondary | ICD-10-CM

## 2016-12-26 DIAGNOSIS — Z006 Encounter for examination for normal comparison and control in clinical research program: Secondary | ICD-10-CM

## 2016-12-26 DIAGNOSIS — J9811 Atelectasis: Secondary | ICD-10-CM

## 2016-12-26 DIAGNOSIS — Z7409 Other reduced mobility: Secondary | ICD-10-CM

## 2016-12-26 HISTORY — PX: MITRAL VALVE REPAIR: SHX2039

## 2016-12-26 LAB — CBC WITH DIFF, BLOOD
ANC-Automated: 3.1 10*3/uL (ref 1.6–7.0)
ANC-Automated: 5.7 10*3/uL (ref 1.6–7.0)
Abs Basophils: 0.1 10*3/uL (ref <0.1)
Abs Basophils: 0.1 10*3/uL (ref <0.1)
Abs Eosinophils: 0.2 10*3/uL (ref 0.1–0.5)
Abs Eosinophils: 0.2 10*3/uL (ref 0.1–0.5)
Abs Lymphs: 1.5 10*3/uL (ref 0.8–3.1)
Abs Lymphs: 1.4 10*3/uL (ref 0.8–3.1)
Abs Monos: 0.6 10*3/uL (ref 0.2–0.8)
Abs Monos: 0.8 10*3/uL (ref 0.2–0.8)
Basophils: 1 %
Basophils: 1 %
Eosinophils: 4 %
Eosinophils: 3 %
Hct: 32.7 % — ABNORMAL LOW (ref 34.0–45.0)
Hct: 32.6 % — ABNORMAL LOW (ref 34.0–45.0)
Hgb: 10.7 gm/dL — ABNORMAL LOW (ref 11.2–15.7)
Hgb: 10.8 gm/dL — ABNORMAL LOW (ref 11.2–15.7)
Lymphocytes: 27 %
Lymphocytes: 16 %
MCH: 27.5 pg (ref 26.0–32.0)
MCH: 28.1 pg (ref 26.0–32.0)
MCHC: 32.7 g/dL (ref 32.0–36.0)
MCHC: 33.1 g/dL (ref 32.0–36.0)
MCV: 84.1 um3 (ref 79.0–95.0)
MCV: 84.7 um3 (ref 79.0–95.0)
MPV: 10.4 fL (ref 9.4–12.4)
MPV: 10.1 fL (ref 9.4–12.4)
Monocytes: 10 %
Monocytes: 10 %
Plt Count: 189 10*3/uL (ref 140–370)
Plt Count: 176 10*3/uL (ref 140–370)
RBC: 3.89 10*6/uL — ABNORMAL LOW (ref 3.90–5.20)
RBC: 3.85 10*6/uL — ABNORMAL LOW (ref 3.90–5.20)
RDW: 15.8 % — ABNORMAL HIGH (ref 12.0–14.0)
RDW: 15.8 % — ABNORMAL HIGH (ref 12.0–14.0)
Segs: 57 %
Segs: 69 %
WBC: 5.6 10*3/uL (ref 4.0–10.0)
WBC: 8.2 10*3/uL (ref 4.0–10.0)

## 2016-12-26 LAB — ABG + CHEM PANEL + H&H RC
Base Excess, Art (iSTAT): -1 mmol/L (ref ?–2)
Bicarbonate, Art (iSTAT): 24.1 mmol/L (ref 20.0–29.0)
Calcium, Ionized Art (iSTAT): 1.2 mmol/L (ref 1.13–1.32)
FiO2, Art (iSTAT): 100
Glucose, Art (iSTAT): 121 mg/dL — ABNORMAL HIGH (ref 70–105)
Hct, Art (iSTAT): 32 %PCV — ABNORMAL LOW (ref 38–51)
Hgb, Art (Est.) (iSTAT): 10.9 g/dL — ABNORMAL LOW (ref 12–17)
O2 Sat, Art (iSTAT): 100 % — ABNORMAL HIGH (ref 95–98)
Potassium, Art (iSTAT): 3.4 mmol/L — ABNORMAL LOW (ref 3.5–4.9)
Sodium, Art (iSTAT): 140 mmol/L (ref 138–146)
Temp, Art (iSTAT): 37
pCO2, Art (T) (iSTAT): 40.4 mmHg (ref 36–46)
pCO2, Art (Uncorr) (iSTAT): 40.4 mmHg (ref 36–46)
pH, Art (T) (iSTAT): 7.38 (ref 7.35–7.46)
pH, Art (Uncorr) (iSTAT): 7.38 (ref 7.35–7.46)
pO2, Art (T) (iSTAT): 218 mmHg — ABNORMAL HIGH (ref 74–109)
pO2, Art (Uncorr) (iSTAT): 218 mmHg — ABNORMAL HIGH (ref 74–109)

## 2016-12-26 LAB — BASIC METABOLIC PANEL, BLOOD
Anion Gap: 14 mmol/L (ref 7–15)
BUN: 13 mg/dL (ref 8–23)
Bicarbonate: 19 mmol/L — ABNORMAL LOW (ref 22–29)
Calcium: 7.9 mg/dL — ABNORMAL LOW (ref 8.5–10.6)
Chloride: 111 mmol/L — ABNORMAL HIGH (ref 98–107)
Creatinine: 0.62 mg/dL (ref 0.51–0.95)
GFR: >60 mL/min
Glucose: 137 mg/dL — ABNORMAL HIGH (ref 70–99)
Potassium: 3.8 mmol/L (ref 3.5–5.1)
Sodium: 144 mmol/L (ref 136–145)

## 2016-12-26 LAB — COMPREHENSIVE METABOLIC PANEL, BLOOD
ALT (SGPT): <5 U/L (ref 0–33)
AST (SGOT): 19 U/L (ref 0–32)
Albumin: 3.5 g/dL (ref 3.5–5.2)
Alkaline Phos: 79 U/L (ref 35–140)
Anion Gap: 11 mmol/L (ref 7–15)
BUN: 14 mg/dL (ref 8–23)
Bicarbonate: 19 mmol/L — ABNORMAL LOW (ref 22–29)
Bilirubin, Tot: 0.63 mg/dL (ref <1.2)
Calcium: 8.2 mg/dL — ABNORMAL LOW (ref 8.5–10.6)
Chloride: 111 mmol/L — ABNORMAL HIGH (ref 98–107)
Creatinine: 0.68 mg/dL (ref 0.51–0.95)
GFR: >60 mL/min
Glucose: 128 mg/dL — ABNORMAL HIGH (ref 70–99)
Potassium: 3.7 mmol/L (ref 3.5–5.1)
Sodium: 141 mmol/L (ref 136–145)
Total Protein: 5.5 g/dL — ABNORMAL LOW (ref 6.0–8.0)

## 2016-12-26 LAB — LACTATE, BLOOD: Lactate: 0.9 mmol/L (ref 0.5–2.0)

## 2016-12-26 LAB — GLUCOSE (POCT)
Glucose (POCT): 129 mg/dL — ABNORMAL HIGH (ref 70–99)
Glucose (POCT): 104 mg/dL — ABNORMAL HIGH (ref 70–99)
Glucose (POCT): 110 mg/dL — ABNORMAL HIGH (ref 70–99)
Glucose (POCT): 80 mg/dL (ref 70–99)
Glucose (POCT): 129 mg/dL — ABNORMAL HIGH (ref 70–99)
Glucose (POCT): 146 mg/dL — ABNORMAL HIGH (ref 70–99)
Glucose (POCT): 123 mg/dL — ABNORMAL HIGH (ref 70–99)

## 2016-12-26 SURGERY — REPLACEMENT, VALVE, AORTIC, TRANSCATHETER (TAVR)
Anesthesia: General | Site: Chest | Wound class: Class I (Clean)

## 2016-12-26 MED ORDER — SUGAMMADEX SODIUM 200 MG/2ML IV SOLN
INTRAVENOUS | Status: DC | PRN
Start: 2016-12-26 — End: 2016-12-26
  Administered 2016-12-26: 400 mg via INTRAVENOUS

## 2016-12-26 MED ORDER — LIDOCAINE HCL (PF) 1 % IJ SOLN
0.1000 mL | INTRAMUSCULAR | Status: DC | PRN
Start: 2016-12-26 — End: 2016-12-26

## 2016-12-26 MED ORDER — HYDROCODONE-ACETAMINOPHEN 10-325 MG OR TABS
1.0000 | ORAL_TABLET | ORAL | Status: DC | PRN
Start: 2016-12-26 — End: 2016-12-26

## 2016-12-26 MED ORDER — SODIUM CHLORIDE 0.9 % IV SOLN
10.0000 mg | INTRAVENOUS | Status: DC | PRN
Start: 2016-12-26 — End: 2016-12-26
  Administered 2016-12-26: 07:00:00 20 ug/min via INTRAVENOUS
  Administered 2016-12-26: 30 ug/min via INTRAVENOUS
  Administered 2016-12-26: 20 ug/min via INTRAVENOUS

## 2016-12-26 MED ORDER — PROTAMINE SULFATE 10 MG/ML IV SOLN
INTRAVENOUS | Status: DC | PRN
Start: 2016-12-26 — End: 2016-12-26
  Administered 2016-12-26: 30 mg via INTRAVENOUS

## 2016-12-26 MED ORDER — HELP
150.0000 ug | Freq: Two times a day (BID) | Status: DC
Start: 2016-12-26 — End: 2016-12-26

## 2016-12-26 MED ORDER — BUPRENORPHINE HCL 150 MCG BU FILM
150.0000 ug | ORAL_FILM | Freq: Two times a day (BID) | BUCCAL | Status: DC
Start: 2016-12-26 — End: 2016-12-27
  Administered 2016-12-26 – 2016-12-27 (×2): 150 ug via SUBLINGUAL
  Filled 2016-12-26 (×4): qty 1

## 2016-12-26 MED ORDER — DEXTROSE 50 % IV SOLN
12.5000 g | INTRAVENOUS | Status: DC | PRN
Start: 2016-12-26 — End: 2016-12-27

## 2016-12-26 MED ORDER — HEPARIN (PORCINE) IN NACL 2-0.9 UNIT/ML-% IJ SOLN
INTRAMUSCULAR | Status: AC
Start: 2016-12-26 — End: ?
  Filled 2016-12-26: qty 1000

## 2016-12-26 MED ORDER — VANCOMYCIN HCL 1000 MG IV SOLR
1000.0000 mg | INTRAVENOUS | Status: DC | PRN
Start: 2016-12-26 — End: 2016-12-26
  Administered 2016-12-26: 1000 mg via INTRAVENOUS

## 2016-12-26 MED ORDER — PROPOFOL 1000 MG/100ML IV EMUL
INTRAVENOUS | Status: DC | PRN
Start: 2016-12-26 — End: 2016-12-26
  Administered 2016-12-26: 50 ug/kg/min via INTRAVENOUS

## 2016-12-26 MED ORDER — GLUCAGON HCL (RDNA) 1 MG IJ SOLR
1.0000 mg | Freq: Once | INTRAMUSCULAR | Status: DC | PRN
Start: 2016-12-26 — End: 2016-12-27

## 2016-12-26 MED ORDER — LACTATED RINGERS IV SOLN
INTRAVENOUS | Status: DC | PRN
Start: 2016-12-26 — End: 2016-12-26
  Administered 2016-12-26: 07:00:00 via INTRAVENOUS

## 2016-12-26 MED ORDER — ACETAMINOPHEN 325 MG PO TABS
650.0000 mg | ORAL_TABLET | ORAL | Status: DC | PRN
Start: 2016-12-26 — End: 2016-12-27

## 2016-12-26 MED ORDER — SENNA 8.6 MG OR TABS
2.0000 | ORAL_TABLET | Freq: Every morning | ORAL | Status: DC
Start: 2016-12-26 — End: 2016-12-27
  Administered 2016-12-27: 17.2 mg via ORAL
  Filled 2016-12-26: qty 2

## 2016-12-26 MED ORDER — NALOXONE HCL 0.4 MG/ML IJ SOLN
0.1000 mg | INTRAMUSCULAR | Status: DC | PRN
Start: 2016-12-26 — End: 2016-12-27

## 2016-12-26 MED ORDER — LACTATED RINGERS IV SOLN
INTRAVENOUS | Status: DC
Start: 2016-12-26 — End: 2016-12-26

## 2016-12-26 MED ORDER — EPHEDRINE SULFATE 50 MG/ML IJ SOLN
INTRAMUSCULAR | Status: DC | PRN
Start: 2016-12-26 — End: 2016-12-26
  Administered 2016-12-26: 5 mg via INTRAVENOUS

## 2016-12-26 MED ORDER — IODIXANOL 320 MG/ML IV SOLN
INTRAVENOUS | Status: AC
Start: 2016-12-26 — End: ?
  Filled 2016-12-26: qty 200

## 2016-12-26 MED ORDER — LABETALOL HCL 5 MG/ML IV SOLN
5.0000 mg | INTRAVENOUS | Status: DC | PRN
Start: 2016-12-26 — End: 2016-12-26

## 2016-12-26 MED ORDER — LANSOPRAZOLE 30 MG OR CPDR
30.0000 mg | DELAYED_RELEASE_CAPSULE | Freq: Every day | ORAL | Status: DC
Start: 2016-12-27 — End: 2016-12-27
  Administered 2016-12-27: 30 mg via ORAL
  Filled 2016-12-26: qty 1

## 2016-12-26 MED ORDER — GLUCOSE 4 GM PO CHEW (CUSTOM)
4.0000 | CHEWABLE_TABLET | ORAL | Status: DC | PRN
Start: 2016-12-26 — End: 2016-12-27

## 2016-12-26 MED ORDER — SODIUM CHLORIDE 0.9 % IV SOLN
2.0000 mg | INTRAVENOUS | Status: DC | PRN
Start: 2016-12-26 — End: 2016-12-26
  Administered 2016-12-26: .1 ug/kg/min via INTRAVENOUS

## 2016-12-26 MED ORDER — OXYCODONE HCL 5 MG OR TABS
5.0000 mg | ORAL_TABLET | Freq: Once | ORAL | Status: AC | PRN
Start: 2016-12-26 — End: 2016-12-26
  Administered 2016-12-26: 5 mg via ORAL
  Filled 2016-12-26: qty 1

## 2016-12-26 MED ORDER — HYDROCODONE-ACETAMINOPHEN 5-325 MG OR TABS
1.0000 | ORAL_TABLET | ORAL | Status: DC | PRN
Start: 2016-12-26 — End: 2016-12-26

## 2016-12-26 MED ORDER — CLOPIDOGREL BISULFATE 75 MG OR TABS
75.0000 mg | ORAL_TABLET | Freq: Every day | ORAL | Status: DC
Start: 2016-12-26 — End: 2016-12-27
  Administered 2016-12-27: 75 mg via ORAL
  Filled 2016-12-26: qty 1

## 2016-12-26 MED ORDER — HEPARIN SODIUM (PORCINE) 1000 UNIT/ML IJ SOLN (CUSTOM)
INTRAMUSCULAR | Status: AC
Start: 2016-12-26 — End: ?
  Filled 2016-12-26: qty 30

## 2016-12-26 MED ORDER — HEPARIN SODIUM (PORCINE) 1000 UNIT/ML IJ SOLN (CUSTOM)
INTRAMUSCULAR | Status: DC | PRN
Start: 2016-12-26 — End: 2016-12-26
  Administered 2016-12-26: 3000 [IU] via INTRAVENOUS
  Administered 2016-12-26: 5000 [IU] via INTRAVENOUS
  Administered 2016-12-26: 3000 [IU] via INTRAVENOUS

## 2016-12-26 MED ORDER — FENTANYL CITRATE (PF) 100 MCG/2ML IJ SOLN
50.0000 ug | INTRAMUSCULAR | Status: DC | PRN
Start: 2016-12-26 — End: 2016-12-26
  Administered 2016-12-26 (×4): 50 ug via INTRAVENOUS
  Filled 2016-12-26 (×2): qty 2

## 2016-12-26 MED ORDER — POLYETHYLENE GLYCOL 3350 OR PACK
17.0000 g | PACK | Freq: Every day | ORAL | Status: DC
Start: 2016-12-26 — End: 2016-12-27
  Administered 2016-12-27: 17 g via ORAL
  Filled 2016-12-26: qty 1

## 2016-12-26 MED ORDER — MEPERIDINE HCL 25 MG/ML IJ SOLN
12.5000 mg | INTRAMUSCULAR | Status: DC | PRN
Start: 2016-12-26 — End: 2016-12-26

## 2016-12-26 MED ORDER — SODIUM CHLORIDE 0.9 % IV SOLN
INTRAVENOUS | Status: DC | PRN
Start: 2016-12-26 — End: 2016-12-26
  Administered 2016-12-26: 07:00:00 via INTRAVENOUS

## 2016-12-26 MED ORDER — ATROPINE SULFATE 1 MG/10ML IJ SOSY
PREFILLED_SYRINGE | INTRAMUSCULAR | Status: DC
Start: 2016-12-26 — End: 2016-12-26
  Filled 2016-12-26: qty 10

## 2016-12-26 MED ORDER — ONDANSETRON HCL 4 MG/2ML IV SOLN
4.0000 mg | Freq: Four times a day (QID) | INTRAMUSCULAR | Status: DC | PRN
Start: 2016-12-26 — End: 2016-12-27

## 2016-12-26 MED ORDER — FENTANYL CITRATE (PF) 100 MCG/2ML IJ SOLN
25.0000 ug | INTRAMUSCULAR | Status: DC | PRN
Start: 2016-12-26 — End: 2016-12-26

## 2016-12-26 MED ORDER — LIDOCAINE HCL (CARDIAC) 20 MG/ML IV SOLN
INTRAVENOUS | Status: DC | PRN
Start: 2016-12-26 — End: 2016-12-26
  Administered 2016-12-26: 80 mg via INTRAVENOUS

## 2016-12-26 MED ORDER — NALOXONE HCL 0.4 MG/ML IJ SOLN
0.1000 mg | INTRAMUSCULAR | Status: DC | PRN
Start: 2016-12-26 — End: 2016-12-26

## 2016-12-26 MED ORDER — ETOMIDATE 2 MG/ML IV SOLN
INTRAVENOUS | Status: DC | PRN
Start: 2016-12-26 — End: 2016-12-26
  Administered 2016-12-26: 20 mg via INTRAVENOUS

## 2016-12-26 MED ORDER — SODIUM CHLORIDE 0.9 % IV SOLN
INTRAVENOUS | Status: DC | PRN
Start: 2016-12-26 — End: 2016-12-26
  Administered 2016-12-26 (×2): via INTRAVENOUS

## 2016-12-26 MED ORDER — SODIUM CHLORIDE 0.9 % IV SOLN
12.5000 mg | Freq: Once | INTRAVENOUS | Status: AC | PRN
Start: 2016-12-26 — End: 2016-12-26

## 2016-12-26 MED ORDER — OXYCODONE HCL 5 MG OR TABS
5.0000 mg | ORAL_TABLET | Freq: Four times a day (QID) | ORAL | Status: DC | PRN
Start: 2016-12-26 — End: 2016-12-27
  Administered 2016-12-26 (×2): 5 mg via ORAL
  Filled 2016-12-26 (×2): qty 1

## 2016-12-26 MED ORDER — DEXTROSE (DIABETIC USE) 40 % OR GEL
1.0000 | ORAL | Status: DC | PRN
Start: 2016-12-26 — End: 2016-12-27
  Administered 2016-12-26: 1 via ORAL

## 2016-12-26 MED ORDER — MENTHOL 3 MG MT LOZG
1.0000 | LOZENGE | OROMUCOSAL | Status: DC | PRN
Start: 2016-12-26 — End: 2016-12-27

## 2016-12-26 MED ORDER — DIPHENHYDRAMINE HCL 50 MG/ML IJ SOLN
12.5000 mg | Freq: Once | INTRAMUSCULAR | Status: AC | PRN
Start: 2016-12-26 — End: 2016-12-26

## 2016-12-26 MED ORDER — ONDANSETRON HCL 4 MG/2ML IV SOLN
4.0000 mg | Freq: Once | INTRAMUSCULAR | Status: AC | PRN
Start: 2016-12-26 — End: 2016-12-26

## 2016-12-26 MED ORDER — POTASSIUM CHLORIDE CRYS CR 10 MEQ OR TBCR
20.0000 meq | EXTENDED_RELEASE_TABLET | Freq: Once | ORAL | Status: AC
Start: 2016-12-26 — End: 2016-12-26
  Administered 2016-12-26: 20 meq via ORAL
  Filled 2016-12-26: qty 2

## 2016-12-26 MED ORDER — SODIUM CHLORIDE 0.9% TKO INFUSION
INTRAVENOUS | Status: DC | PRN
Start: 2016-12-26 — End: 2016-12-27

## 2016-12-26 MED ORDER — PHENYLEPHRINE DILUTION 100 MCG/ML IJ SOLN
INTRAVENOUS | Status: DC | PRN
Start: 2016-12-26 — End: 2016-12-26
  Administered 2016-12-26 (×3): 100 ug via INTRAVENOUS

## 2016-12-26 MED ORDER — NARCOTIC, PATIENT'S OWN MED (FOR PYXIS)
Status: DC
Start: 2016-12-26 — End: 2016-12-27
  Filled 2016-12-26 (×3): qty 1

## 2016-12-26 MED ORDER — LIDOCAINE HCL 2 % IJ SOLN
INTRAMUSCULAR | Status: AC
Start: 2016-12-26 — End: ?
  Filled 2016-12-26: qty 20

## 2016-12-26 MED ORDER — MIDAZOLAM HCL 2 MG/2ML IJ SOLN
INTRAMUSCULAR | Status: DC | PRN
Start: 2016-12-26 — End: 2016-12-26
  Administered 2016-12-26: 2 mg via INTRAVENOUS

## 2016-12-26 MED ORDER — DIPHENHYDRAMINE HCL 25 MG OR CAPS
50.00 mg | ORAL_CAPSULE | Freq: Four times a day (QID) | ORAL | Status: DC | PRN
Start: ? — End: 2018-10-17

## 2016-12-26 MED ORDER — LIDOCAINE 5 % EX PTCH
1.0000 | MEDICATED_PATCH | CUTANEOUS | Status: DC
Start: 2016-12-26 — End: 2016-12-27
  Administered 2016-12-27: 1 via TRANSDERMAL
  Filled 2016-12-26: qty 1

## 2016-12-26 MED ORDER — ONDANSETRON HCL 4 MG/2ML IV SOLN
INTRAMUSCULAR | Status: DC | PRN
Start: 2016-12-26 — End: 2016-12-26
  Administered 2016-12-26: 4 mg via INTRAVENOUS

## 2016-12-26 MED ORDER — SODIUM CHLORIDE 0.9 % IJ SOLN (CUSTOM)
3.0000 mL | INTRAMUSCULAR | Status: DC | PRN
Start: 2016-12-26 — End: 2016-12-27

## 2016-12-26 MED ORDER — SODIUM CHLORIDE 0.9 % IV SOLN
INTRAVENOUS | Status: DC | PRN
Start: 2016-12-26 — End: 2016-12-26
  Administered 2016-12-26: 1000 mL

## 2016-12-26 MED ORDER — DEXTROSE (DIABETIC USE) 40 % OR GEL
ORAL | Status: DC
Start: 2016-12-26 — End: 2016-12-26
  Filled 2016-12-26: qty 37.5

## 2016-12-26 MED ORDER — ASPIRIN 81 MG OR CHEW
81.0000 mg | CHEWABLE_TABLET | Freq: Every day | ORAL | Status: DC
Start: 2016-12-26 — End: 2016-12-27
  Administered 2016-12-27 (×2): 81 mg via ORAL
  Filled 2016-12-26: qty 1

## 2016-12-26 MED ORDER — IODIXANOL 320 MG/ML IV SOLN
INTRAVENOUS | Status: DC | PRN
Start: 2016-12-26 — End: 2016-12-26
  Administered 2016-12-26: 100 mL

## 2016-12-26 MED ORDER — HYDRALAZINE HCL 20 MG/ML IJ SOLN
10.0000 mg | INTRAMUSCULAR | Status: DC | PRN
Start: 2016-12-26 — End: 2016-12-26

## 2016-12-26 MED ORDER — HYDROMORPHONE HCL 1 MG/ML IJ SOLN
0.5000 mg | INTRAMUSCULAR | Status: DC | PRN
Start: 2016-12-26 — End: 2016-12-26
  Administered 2016-12-26 (×4): 0.5 mg via INTRAVENOUS
  Filled 2016-12-26 (×4): qty 0.5

## 2016-12-26 MED ORDER — SODIUM CHLORIDE 0.9 % IJ SOLN (CUSTOM)
3.0000 mL | Freq: Three times a day (TID) | INTRAMUSCULAR | Status: DC
Start: 2016-12-26 — End: 2016-12-27

## 2016-12-26 MED ORDER — ZOLPIDEM TARTRATE 5 MG OR TABS
5.0000 mg | ORAL_TABLET | Freq: Every evening | ORAL | Status: DC | PRN
Start: 2016-12-26 — End: 2016-12-27
  Administered 2016-12-26: 5 mg via ORAL
  Filled 2016-12-26: qty 1

## 2016-12-26 MED ORDER — ROCURONIUM BROMIDE 100 MG/10ML IV SOLN
INTRAVENOUS | Status: DC | PRN
Start: 2016-12-26 — End: 2016-12-26
  Administered 2016-12-26: 50 mg via INTRAVENOUS
  Administered 2016-12-26: 30 mg via INTRAVENOUS

## 2016-12-26 SURGICAL SUPPLY — 59 items
BOWL STERILE LARGE 32 OZ (Misc Surgical Supply) ×6
CABLE EXTENSION EXTERNAL PACING DISPOSABLE (Misc Surgical Supply) ×2
CATHETER 5FR FR4 IMPULSE ANGIO RT (Drains/Catheter/Tubes/Reservoir) ×2
CATHETER BALLOON MUSTANG 12MM X 40MM X 135CM (Procedural wires/sheaths/catheters/balloons/dilators) ×2 IMPLANT
CATHETER EP PACEL 5FR X L110 CM TEMPORARY BIPOLAR PACING (Procedural wires/sheaths/catheters/balloons/dilators) ×2 IMPLANT
CONTAINER PRECISION SPECIMEN, 4OZ- STERILE (Misc Medical Supply) ×2
CONTRAST SPIKE W/ 60 LINE (Tubing/Suction) ×2 IMPLANT
COVER SNAP KOVER BANDED BAG 30" X 36" (Drapes/towels) ×2
COVER ULTRASOUND PROBE W/ GEL (Drapes/towels) ×2
DRAPE HALF SHEET MEDIUM (Drapes/towels) ×2 IMPLANT
DRAPE IOBAN 2 ANTIMICROBIAL 23" X 17" (Misc Surgical Supply) ×2
DRAPE ORTHO BAR SHEET 100 X 60" (Drapes/towels) ×2 IMPLANT
DRAPE TRANSRADIAL FEMORAL ANGIOGRAPHY - MEDLINE (Drapes/towels) ×2 IMPLANT
DRESSING MEPILEX BORDER SACRUM 23 X 23CM (Dressings/packing) ×2 IMPLANT
EQUIPMENT COVER SNAP KAP 18" DOME STERILE (Drape/Gowns/Gloves/Pack) ×2 IMPLANT
FILM IOBAN 2 ANTIMICROBIAL 23" X 17" (Misc Surgical Supply) ×2 IMPLANT
GLOVE BIOGEL PI INDICATOR SIZE 8 (Gloves/gowns) ×6 IMPLANT
GLOVE BIOGEL PI ULTRATOUCH SIZE 7.5 (Gloves/gowns) ×16
GOWN SIRUS XLG BLUE, AAMI LVL 4 (Gloves/gowns) ×6 IMPLANT
GUIDEWIRE AMPLATZ 3MM J EXTRA STIFF .035" X 260CM, 3CM FLOP (Procedural wires/sheaths/catheters/balloons/dilators) ×2 IMPLANT
GUIDEWIRE AMPLATZ EXTRA STIFF .035" X 300CM, 3CM FLOP (Procedural wires/sheaths/catheters/balloons/dilators) ×2 IMPLANT
GUIDEWIRE AMPLATZ SUPER STIFF 0.035" X 180CM, 7CM TAPER, 3MM J-TIP (Procedural wires/sheaths/catheters/balloons/dilators) ×2
GUIDEWIRE AMPLATZ SUPER STIFF 0.035" X 260CM, 1CM TAPER, STRAIGHT, SHORT (Procedural wires/sheaths/catheters/balloons/dilators) ×2 IMPLANT
GUIDEWIRE AMPLATZ SUPER STIFF 0.035" X 260CM, 7CM TAPER, 3MM J-TIP (Procedural wires/sheaths/catheters/balloons/dilators) ×2
GUIDEWIRE FIXED CORE J .035 X 260CM, 3MM CURVE (Misc Medical Supply) ×2 IMPLANT
GUIDEWIRE GUIDERIGHT .035 X 150CM, 3MM J (Misc Medical Supply) ×2 IMPLANT
INTRODUCER BRAIDED 8.5FR SL1 (Drains/Catheter/Tubes/Reservoir) ×2 IMPLANT
INTRODUCER SHEATH ULTIMUM 5FR X 12CM (Drains/Catheter/Tubes/Reservoir) ×4 IMPLANT
INTRODUCER SHEATH ULTIMUM 6FR X 12CM (Drains/Catheter/Tubes/Reservoir) ×4 IMPLANT
KIT SUTURE REMOVAL 58973 (Kits/Sets/Trays) ×2
LEADWEAR SINGLE 2.0 SZ 73/5 LRG (Misc Medical Supply) ×2 IMPLANT
NEEDLE PROTECT 18G X 1.5" (Needles/punch/cannula/biopsy)
OR SET-UP V PACK (Drapes/towels) ×2 IMPLANT
PACK CARDIOVASCULAR VU THRU (Procedure Packs/kits) ×2 IMPLANT
PACK HEART MANIFOLD LEFT (Misc Medical Supply) ×2
PAD GROUND VALLEYLAB REM ADULT E7507 (Misc Surgical Supply) ×4 IMPLANT
PREP DURAPREP SURGICAL SOLUTION 26 ML (Misc Surgical Supply) ×4 IMPLANT
RETRACTOR ALEXIS WOUND PROTECTOR SMALL 2.5-6CM (Lap/Endo/Arthroscopy) IMPLANT
SET MICROPUNCTURE INTRODUCER STIFFENED 5FR X 10CM (Procedural wires/sheaths/catheters/balloons/dilators) ×2 IMPLANT
SOLUTION IRR POUR BTL 0.9% NS 1000ML (Non-Pharmacy Meds/Solutions) ×4
SOLUTION IRR POUR BTL H20 1000ML (Non-Pharmacy Meds/Solutions) ×2
SPONGE GAUZE RF-DETECT 4" X 4" 16-PLY (Dressings/packing) ×6 IMPLANT
SPONGE VISTEC 4 X 4 16PLY, X-RAY (Dressings/packing) IMPLANT
STOPCOCK MARQUIS HI-PRESSURE 3-WAY (Misc Surgical Supply) ×2
SURGICAL PACK BASIC MAJOR SET-UP (Procedure Packs/kits) ×2 IMPLANT
SUTURE ETHIBOND EXCEL 1 30" CT-1 (Suture) ×4
SUTURE PROLENE 0 30" CT-1 (Suture) ×1
SUTURE PROLENE 0 30" CT-1 8424 (Suture) ×1
SYRINGE HYPO LL 10CC (Needles/punch/cannula/biopsy) IMPLANT
SYRINGE HYPO LL 20CC (Needles/punch/cannula/biopsy) ×2
SYRINGE HYPO LL 60CC (Needles/punch/cannula/biopsy) ×4 IMPLANT
SYRINGE INFLATION BASIX COMPAK 30ATM (Tubing/suction) ×2
SYRINGE MEDRAD PROVIS 150 ML & QFT (Needles/punch/cannula/biopsy) ×2 IMPLANT
TOWELS OR BLUE 4-PACK STERILE, DISPOSABLE (Drapes/towels) ×6 IMPLANT
TRAY CARDIAC CATH CUSTOM (Kits/Sets/Trays) ×2 IMPLANT
TRAY FOLEY SURESTEP LUBRI-SIL I.C.16FR URIMETER, LF, TEMP SENSING (Lines/Drains) ×2 IMPLANT
TUBING CONTRAST INJECTOR, HIGH PRESSURE 48" (Tubing/suction) ×2
VALVE HEART SAPIEN 3 26MM (9600CM26A) (Biologic Valves) ×2 IMPLANT
WIRE .035 145CM BENTSON STR (Misc Medical Supply) ×2

## 2016-12-26 NOTE — Anesthesia Postprocedure Evaluation (Signed)
Anesthesia Transfer of Care Note    Patient: Cathy Lucas    Procedures performed: Procedure(s):  REPLACEMENT MITRAL VALVE TRANSCATHETER    Vital signs: stable           Anesthesia Post Note    Patient: Cathy Lucas    Procedure(s) Performed: Procedure(s):  REPLACEMENT MITRAL VALVE TRANSCATHETER      Final anesthesia type: General    Patient location: PACU    Post anesthesia pain: adequate analgesia    Mental status: awake, alert  and oriented    Airway Patent: Yes    Last Vitals:   Vitals:    12/26/16 1330   BP: 96/68   Pulse: 63   Resp: 19   Temp:    SpO2: 100%       Post vital signs: stable    Hydration: adequate    N/V:no    Anesthetic complications: no    Plan of care per primary team.

## 2016-12-26 NOTE — Anesthesia Procedure Notes (Signed)
Arterial Line Procedure Note  Procedure: Arterial Line Insertion Date & Time: Date & Time: 12/26/2016 6:50 AM   Preparation: Patient was prepped and draped in usual sterile fashion  Indications:multiple ABGs and hemodynamic monitoring  Location: right radial Universal Protocol: Universal Protocol: Verbal consent obtained, risks and benefits discussed, patient states understanding of the procedure being performed, the patient's understanding of the procedure matches consent given, procedure consent matches procedure scheduled, relevant documents present and verified, test results available and properly labeled, site marked, imaging studies available, required blood products, implants, devices, and special equipment available and Immediately prior to procedure a time out was called to verify the correct patient, procedure, equipment, support staff and site/side marked as required  Consent given by: Consent given by: patient  Patient identity confirmed: Patient identity confirmed by: verbally with patient and arm band   Anesthesia: lidocaine 2% without epinephrine   Anesthetic total:  mL Sedation: patient sedated   Procedure Details: 20, Seldinger technique used and 2  Needle Gauge: 20  Number of insertion attempts 2  Ultrasound Guided Ultrasound Guided Procedure  Post Procedure: dressing applied and Patient tolerated the procedure well with no immediate complications  dressing applied   Comments:

## 2016-12-26 NOTE — Anesthesia Procedure Notes (Addendum)
Perioperative Echocardiography Report  Date/Time:Date & Time: 12/26/2016 7:39 AM  Universal protocol    Procedure performed in OR. See OR procedure details for universal precautions documentation  Universal Protocol: Verbal consent obtained, risks and benefits discussed, patient states understanding of the procedure being performed, the patient's understanding of the procedure matches consent given, procedure consent matches procedure scheduled, relevant documents present and verified and required blood products, implants, devices, and special equipment available  Consent given by: patient  Patient identity confirmed by: verbally with patient and arm band Procedure details    Procedure type: Transesophageal Echocardiography (TEE)  Echocardiographer: Christean Leaf  Requesting service physician:  GOLTS  Unit/Department where procedure was performed: OR  Indications: Cardiac surgery (see comments)  Patient intubated and sedated  Insertion: Easy   Ventricles    LV:  Cavity size: Normal  Cavity dimension: cm  Hypertrophy: no   Thrombus:no   Global function: Normal  Regional function: No wall motion abnormalities  EF%:   LV comments:      RV:  Cavity size: Dilated  Cavity dimension:4.5cm  Hypertrophy: no   Thrombus: no   Global function: Mild dysfunction  Regional function: No wall motion abnormalities  EF%:   RV comments:     LV Diastolic function    Grade:  E/A ratio:  E-wave deceleration:  Pulmonary venous flow pattern:  E/E' ratio:  LV diastolic function comments:    Atria    LA:  Size: Dilated  SEC (smoke):no   Thrombus: Thrombus: no     Tumor: Tumor: no   Devices/catheters:no   LA comments:    RA:  Size: Dilated  SEC: no   Thrombus:no   Tumor: no   Devices (catheters):no   RA comments:     Additional findings:  Left atrial appendage: : LAA previously excised.  Interarterial septum: Normal  Interventricular septum:Normal Valves    Aortic (AV):  AV annulus:Normal  AV stenosis: None  AV area/gradient:   AV  regurgitation: None  AV leaflet morphology: Normal  AV leaflet morphology: Normal  AVcomments:     Mitral (MV):  MV annulus: Normal  MV stenosis: Severe  MV area/gradient:   MV regurgitation: Moderate  MV leaflet morphology:   MV leaflet motion: Restricted  MV comments: Bioprosthetic MV    Tricuspid (TV):  TV annulus: Normal  TV stenosis: None  TV area/gradient:   TV regurgitation: Moderate  TV leaflet morphology Normal  TV leaflet motion: Normal  TV comments:     Pulmonic (PV):  PV annulus: Normal  PV stenosis: None  PV area gradient:   PV regurgitation: Trace  PV leaflet morphology: Normal  PV leaflet motion: Normal  PV comments:     Great vessels    Ascending aorta:   Size: Normal  Diameter:   Dissection: no   Grade/PA thrombus:   Placque mobile: no   Comments:     Aortic arch:  Size: Normal  Diameter: cm  Dissection: no   Grade/PA thrombus: 3  Placque mobile: no   Comments:     Descending Aorta:  Size: Normal  Diameter:  cm  Dissection:  no   Grade/PA thrombus: 3  Placque mobile: no   Comments:     Pulmonary Artery:  Size: Normal  Diameter:   Grade/PA thrombus :No  Comments:        Additional findings    Pericardium: Normal  Pleura: Normal  Post intervention follow up study: Details (see note/comments)  Complications: None    Echocardiography  results discussed with surgical team    Procedure comments:  Pre op: Dilated RA/RV with mild decreased RV systolic function, dilated LA, normal LV size and systolic function, bioprosthetic MV with restricted leaflet motion, severe MS (mPG 3mHg), moderate MR, moderate TR, trace PI, no PFO by CFD, no pleural or pericardial effusion, grade 3 atheroma of descending aorta and aortic arch    Post op: small iatrogenic ASD with bidirectional flow, new bioprosthetic MV in place with trivial paravalvular leak, MV mPG 3 mmHg, AV mPG 2 mmHg

## 2016-12-26 NOTE — Op Note (Signed)
PERCUTANEOUS TRANSSEPTAL TRANSCATHETER MITRAL VALVE REPLACEMENT OPERATIVE REPORT    PREOPERATIVE DIAGNOSIS:   1. 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 Severe symptomatic mitral stenosis  3. Moderate/severe mitral regurgitation  4. Severe pulmonary hypertension  5. Paroxysmal atrial fibrillation    POSTOPERATIVE DIAGNOSIS:  1. Successful placement of a 26 mm Sapien 3 valve via the R femoral venous/transseptal approach     PROCEDURE PERFORMED:  1. Catheterization of the right femoral vein.  2. Atrial septal puncture followed by atrial septostomy with a 33m x 424mMustang balloon.  3. Percutaneous balloon mitral valvuloplasty.  4. 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  5. Transvenous pacemaker placement.  6. Figure-of-8 suture deployment.  7. Supervision and interpretation of the above.    SURGEON/STAFF: EhThornton PapasMD    CARDIOTHORACIC SURGERY ATTENDING: EuSelina CooleyGoAlmond LintMD    ADDITIONAL INTERVENTIONAL CARDIOLOGY STAFF: RyIsidoro DonningMD    INLeonaELLOW:Daniel WaCheron SchaumannMD     INDICATIONS FOR PROCEDURE:   Cathy Lucas a [age]ear old female with a history of endocarditis s/p bovine bioprosthetic mitral valve replacement 2012, with concurrent atrial MAZE and LAA clipping at that time. She has since developed severe symptomatic stenosis of the bioprosthetic mitral valve with moderate regurgitation, leading to severe pulmonary hypertension. She is now referred for percutaneous transcatheter mitral valve replacement in the context of prohibitive surgical risk 2/2 repeat sternotomy and severe pulmonary hypertension.    INFORMED CONSENT:   The patient provided informed consent after the risks, benefits, and alternatives were given. The risks included, but were not limited to death, stroke, heart attack, need for emergent surgery, loss of limb, bleeding, infection, need for dialysis. The patient understood these risks and  elected to proceed after signing informed consent.     DESCRIPTION OF PROCEDURE IN DETAIL:   The patient was brought to the hybrid operating room in the fasting state and prepped and draped in a standard sterile fashion. After induction of general anesthesia, she was further prepped and draped in the standard sterile fashion. A transesophageal echocardiogram probe was placed and adequate images were obtained of all relevant cardiac structures. Next, using the modified Seldinger technique, a 6-French ACT sheath was placed in the right femoral vein. A 5-French ACT sheath was then placed into the left femoral vein utilizing the modified Seldinger technique, through which a standard 5-French St. Jude's temporary pacemaker was advanced and positioned within the right ventricle, with excellent electrical capture. A 5-French ACT sheath was then placed into the left femoral artery for pressure monitoring through the procedure. The 6-French sheath within the R femoral vein was then exchanged for an 8-French SL1 sheath. Next, heparin was administered for anticoagulation with the peak ACT being greater than 300 seconds. A transseptal puncture was then performed using a standard Baylis needle through the [facility] under ultrasound and fluoroscopic guidance in an inferior posterior portion of the septum. After crossing the septum, the catheter was hooked up to pressure and atrial location was confirmed. The SL1 wire was then removed and exchanged for a 300 cm super stiff Amplatz wire, which was directed into the upper lateral pulmonary vein. The SL1 catheter was removed, and the 14-French Edwards sheath was advanced over the wire and positioned within the inferior right atrium. Next a 5-French JR4 catheter was advanced and used to exchange the Amplatz wire for a Bentson wire. This was directed across the mitral valve, and the JR4 was  advanced into the left ventricle. The Bentson wire was then removed and exchanged for the  super stiff Amplatz wire. Next a 12 mm x 40 mm Mustang OTW balloon was advanced and used to dilate the atrial septum at a pressure of 2 atmospheres. The balloon was then advanced further across the mitral valve and used to perform balloon valvuloplasty at a pressure of 10 atmospheres. The balloon was subsequently removed. A 26-mm valve catheter was then advanced over the stiff wire into the left atrium under fluoroscopic guidance. The valvewas lined up with aid of fluoroscopically and echocardiography deployed in the usual fashion while rapidly pacing the ventricle. Subsequently, echocardiography did not demonstrate any significant MR. The valve delivery equipment was thus successfully removed via the right femoral access site after exchanging the stiff wire for a regular J wire. The temporary pacemaker was then removed. A figure-of-eight stitch was then placed over the site after removal of the Edwards sheath.     CONCLUSIONS  1. Successful placement of a 26 mm Sapien 3 valve via the R femoral venous/transseptal approach     RECOMMENDATIONS:   1. CCU admission.  2. Strict bed rest and groin precautions for 8 hours.  3. Echocardiogram in AM.  4. If hemodynamically stable overnight restart home meds in AM.    Please note that Dr. Parke Poisson, Dr. Almond Lint, Dr. Evelene Croon were all present, scrubbed, and actively participated during the entire procedure.

## 2016-12-26 NOTE — H&P (Signed)
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 Today the patient reports feeling overall well without acute concern/complaint.    Past Medical History  Past Medical History:   Diagnosis Date    Back pain     Needs L4 fusion    Depression     Diabetes (CMS-HCC)     Fibromyalgia     GERD (gastroesophageal reflux disease)     Glaucoma     Hypercholesterolemia     Hypertension     Hypothyroidism     Migraines     PAF (paroxysmal atrial fibrillation) (CMS-HCC)        Past Surgical History:  Past Surgical History:   Procedure Laterality Date    BILE DUCT STENT PLACEMENT      CHOLECYSTECTOMY      COLONOSCOPY  05/31/2012    normal mucosa with internal hemorrhoids.     ESOPHAGOGASTRODUODENOSCOPY  03/11/2015    HYSTERECTOMY      LIVER BIOPSY  05/22/2015    steatohepatitis with periportal and focal septal fibrosis(stage 2-3 of 4). Small focus of benign epithelial lining suggestive of benign cyst, with adjacent chronic inflammation, reactive fibrosis, and hemosiderin depostition B. Liver asparte fluid: hypocellular specimen. Rare benign hepatocysts and macrophages. No malignant cells identified.    MITRAL VALVE REPAIR      TONSILLECTOMY AND ADENOIDECTOMY         Allergies:  Allergies   Allergen Reactions    Ceftriaxone Rash    Iodine Rash    Iv Contrast [Contrast Media] Anaphylaxis     Itching swelling unable to breathe      Latex Rash    Metformin Rash    Ativan [Lorazepam] Other     Heart palpitations      Januvia [Sitagliptin] Other     Sob, chest pain, heart  palpitations      Macrobid [Nitrofurantoin] Unspecified    Tylenol [Acetaminophen] Other     bc of the lesion of the liver.       Medications:  Prior to Admission Medications   Prescriptions Last Dose Informant Patient Reported? Taking?   Buprenorphine HCl (BELBUCA) 150 MCG FILM 12/26/2016 at 0500  Yes Yes   Sig: Suck 150 mcg in mouth 2 times daily.   Cholecalciferol (VITAMIN D PO)   Yes No   Sig: Take 50,000 capsules by mouth once a week.     HYDROmorphone (DILAUDID) 2 MG tablet 12/26/2016 at 0400  Yes Yes   Sig: Take 2 mg by mouth every 4 hours as needed for Moderate Pain (Pain Score 4-6).   aspirin 81 MG tablet 12/25/2016 at Unknown time  Yes Yes   Sig: Take 81 mg by mouth daily.   atorvastatin (LIPITOR) 10 MG tablet 12/25/2016 at d  Yes Yes   Sig: Take 10 mg by mouth daily.   carvedilol (COREG) 25 MG tablet 12/26/2016 at 0400  Yes Yes   Sig: Take 25 mg by mouth 2 times daily (with meals).   clopidogrel (PLAVIX) 75 MG tablet 12/26/2016 at Unknown time  Yes Yes   Sig: Take 75 mg  by mouth daily.   diphenhydrAMINE (BENADRYL) 25 MG capsule 12/26/2016 at Unknown time  Yes Yes   Sig: Take 50 mg by mouth every 6 hours as needed for Itching.     furosemide (LASIX) 20 MG tablet 12/24/2016  Yes No   Sig: Take 20 mg by mouth as needed.   latanoprost (XALATAN) 0.005 % ophthalmic solution 12/25/2016 at Unknown time  Yes Yes   Sig: Place 1 drop into both eyes every evening.   levothyroxine (SYNTHROID) 100 MCG tablet 12/26/2016 at Unknown time  Yes Yes   Sig: Take 112 mcg by mouth daily.     nitroGLYcerin 2.5 MG Controlled-Release capsule Over a Month at Unknown time  Yes No   Sig: Take 2.5 mg by mouth 2 times daily.   ondansetron (ZOFRAN) 8 MG tablet   Yes No   Sig: Take 8 mg by mouth every 8 hours as needed for Nausea/Vomiting.   pantoprazole (PROTONIX) 40 MG tablet 12/25/2016 at Unknown time  Yes Yes   Sig: Take 40 mg by mouth daily.   senna-docusate (STOOL SOFTENER & LAXATIVE) 8.6-50 MG tablet   Yes No   Sig: Take 1 tablet by mouth  daily.   zolpidem (AMBIEN) 5 MG tablet 12/25/2016 at Unknown time  Yes Yes   Sig: Take 5 mg by mouth nightly as needed for Insomnia.      Facility-Administered Medications: None       Social History:  Social History     Social History    Marital status: Married     Spouse name: N/A    Number of children: N/A    Years of education: N/A     Social History Main Topics    Smoking status: Never Smoker    Smokeless tobacco: Never Used    Alcohol use No    Drug use: No    Sexual activity: Not on file     Social Activities of Daily Living Present    Not on file     Social History Narrative       Family History:  Family History   Problem Relation Age of Onset    Stroke Mother     Heart Disease Mother     Stroke Father     Heart Disease Father      Last Echocardiogram Results    Lab Results   Component Value Date/Time    LVEF 59(54%-74%)[facility]:4.60 11/16/2016 10:30 AM    IVCD 1.70 11/16/2016 10:30 AM    PAPR 101 11/16/2016 10:30 AM    MMG 18.3 11/16/2016 10:30 AM     MS mean gradient: 20 mmHg    Prior Cath Data Findings: 10/01  HEMODYNAMIC FINDINGS                                                                     Post Exercise Findings:                            PA Pressure: 112 mmHg / 39 mmHg, mean 69 mmHg.                 PCW, end-expiratory: 35 mmHg.  VALVE FINDINGS                                 No aortic valve stenosis is visualized.                                                           COMPLICATIONS:                                                                       No apparent complications immediately post-procedure.                                                     CONCLUSION:                                  1. Severe pulmonary hypertension, Severe mitral stenosis.           2. Elevated PA pressure.                           3. Elevated PVR (9.26 woods units) and preserved RVSWI.            4. Mildly elevated LVEDP (14 mmHg) and reduced cardiac output.        5. Severe mitral stenosis (MVA 0.79 cm2; MG 23 mmHg).         Ht 5' 3"  (1.6 m)   Wt 78.6 kg (173 lb 4.5 oz)   BMI 30.7 kg/m2    General: Well developed, well nourished, in no apparent distress.  Lungs: Clear breath sounds bilaterally.  CV: Normal rate, regular rhythm, no significant murmur present.  Abdomen: Soft, nontender, normal bowel sounds present.  Extremities: warm and well-perfused, no clubbing, cyanosis or edema    ASA Score:  3    ASA 1: Healthy patient without organic, biochemical, or psychiatric disease   ASA 2: A patient with mild systemic disease. No significant impact on daily activity. Unlikely impact on anesthesia or surgery.   ASA 3: Significant or severe systemic disease that limits normal activity. Significant impact on daily activity.Likely impact on anesthesia or surgery.    ASA 4: Severe disease that is a constant threat to life or requires intensive therapy. Serious limitation of daily activity. Major impact on anesthesia and surgery.    ASA 5: Moribund patient who is likely to die in the next 24 hours with or without surgery.    ASA 6: Brain-dead organ donor.     Airway (Mallimpati) Score:  Class II - Soft palate, uvula, and fauces are visible.   Class I: Soft palate, uvula, fauces, and pillars are visible   Class II: Soft palate, uvula, and fauces are visible   Class III: Soft palate and base of  the uvula are visible    Class IV: Only the hard palate visible      Assessment and Plan  Proceed to planned  procedure.   General anesthesia per Anesthesia service.    Active Heart Failure: Yes, Class II   Class I: No symptoms of heart failure.   Class II: Symptoms of heart failure with moderate exertion, such as ambulating two blocks or two flights of stairs.   Class III: Symptoms of heart failure with minimal exertion, such as ambulating one block or one flight of stairs, but no symptoms at rest.   Class IV:  Symptoms of heart failure at rest.    CSHA Clinical Frailty Scale: Mildly Frail   Very Fit: People who are robust, active, energetic and motivated. These people commonly exercise regularly. They are among the fittest for their age.   Well: People who have no active disease symptoms but are less fit than category 1. Often, they exercise or are very active occasionally, e.g. seasonally.   Managing Well: People whose medical problems are well controlled, but are not regularly active beyond routine walking.   Vulnerable: While not dependent on others for daily help, often symptoms limit activities. A common complaint is being "slowed up", and/or being tired during the day.   Mildly Frail: These people often have more evident slowing, and need help in high order IADLs (finances, transportation, heavy housework, medications). Typically, mild frailty progressively impairs shopping and walking outside alone, meal preparation and housework.   Moderately Frail: People need help with all outside activities and with keeping house. Inside, they often have problems with stairs and need help with bathing and might need minimal assistance (cuing, standby) with dressing.   Severely Frail: Completely dependent for personal care, from whatever cause (physical or cognitive). Even so, they seem stable and not at high risk of dying (within ~ 6 months).   Very Severely Frail: Completely dependent, approaching the end of life. Typically, they could not recover even from a minor illness.   Terminally Ill: Approaching the end of  life. This category applies to people with a life expectancy <6 months, who are not otherwise evidently frail.    Results for orders placed or performed in visit on 12/18/16   CBC w/ Diff [place]   Result Value Ref Range    WBC 8.0 4.0 - 10.0 1000/mm3    RBC 4.92 3.90 - 5.20 mill/mm3    Hgb 13.3 11.2 - 15.7 gm/dL    Hct 41.0 34.0 - 45.0 %    MCV 83.3 79.0 - 95.0 um3    MCH 27.0 26.0 - 32.0 pgm    MCHC 32.4 32.0 - 36.0 g/dL    RDW 15.4 (H) 12.0 - 14.0 %    MPV 10.6 9.4 - 12.4 fL    Plt Count 254 140 - 370 1000/mm3    Segs 65 %    Lymphocytes 19 %    Monocytes 11 %    Eosinophils 4 %    Basophils 1 %    ANC-Automated 5.2 1.6 - 7.0 1000/mm3    Abs Lymphs 1.5 0.8 - 3.1 1000/mm3    Abs Monos 0.9 (H) 0.2 - 0.8 1000/mm3    Abs Eosinophils 0.3 <0.1 - 0.5 1000/mm3    Abs Basophils 0.1 <0.1 1000/mm3    Diff Type Automated    Comprehensive Metabolic Panel Green   Result Value Ref Range    Glucose 154 (H) 70 - 99 mg/dL    BUN  14 8 - 23 mg/dL    Creatinine 0.64 0.51 - 0.95 mg/dL    GFR >60 mL/min    Sodium 139 136 - 145 mmol/L    Potassium 4.8 3.5 - 5.1 mmol/L    Chloride 101 98 - 107 mmol/L    Bicarbonate 25 22 - 29 mmol/L    Anion Gap 13 7 - 15 mmol/L    Calcium 9.5 8.5 - 10.6 mg/dL    Total Protein 7.8 6.0 - 8.0 g/dL    Albumin 4.5 3.5 - 5.2 g/dL    Bilirubin, Tot 0.60 <1.2 mg/dL    AST (SGOT) 9 0 - 32 U/L    ALT (SGPT) <5 0 - 33 U/L    Alkaline Phos 100 35 - 140 U/L   Results for orders placed or performed during the hospital encounter of 10/09/14   Glycosylated Hgb(A1C), Blood [place]   Result Value Ref Range    Glyco Hgb (A1C) 7.9 (H) 4.8 - 5.9 %       Discussed known risks associated with this procedure, which include but are not limited to minor risks of bleeding, infection, arrhythmia requiring cardioversion or defibrillation, transient kidney failure, vascular access site complication and major risks including heart attack, stroke, emergency open heart surgery, and death.      Patient understands these risks, and  proceeds to sign the consent form.  Patient seen and discussed with the attending physician Dr. Evelene Croon.    The patient has consented to the procedure, which will be done with sedation.  I have assessed the patient's status immediately prior to this procedure.  I have discussed pain management needs and options for the patient with the patient or caregiver.      The patient agrees to be full code for the duration of the procedure.    Sedation options, risks, and plans have been discussed with the patient or caregiver.  Questions were answered.  The patient or caregiver agrees to proceed as planned.    [facility] PROGRESS NOTE ATTESTATION  Patient was interviewed and examined with the cardiology team today. I was present for key elements of the history, exam, and medical decision making.  I have reviewed the note above and additions/revisions are included.    Subjective   I have reviewed the history and agree with above. Interval history: no changes     Objective   I have examined the patient and concur with the exam. Clean right and left inguinal areas.    Assessment and Plan   I agree with the care plan. Discussed the procedure in detail with the patient and her family including the minor and major risks and the necessity for general anesthesia and TEE monitoring.  She signed the consent form after all questions were answered and wished to proceed as scheduled.  See the above note for further details.      Eden Emms, MD  Interventional Cardiology  Pg [phone]

## 2016-12-27 ENCOUNTER — Encounter (HOSPITAL_COMMUNITY): Payer: Self-pay | Admitting: Nurse Practitioner

## 2016-12-27 ENCOUNTER — Inpatient Hospital Stay (HOSPITAL_BASED_OUTPATIENT_CLINIC_OR_DEPARTMENT_OTHER): Payer: Medicaid Other

## 2016-12-27 ENCOUNTER — Inpatient Hospital Stay (HOSPITAL_COMMUNITY): Payer: Medicaid Other

## 2016-12-27 DIAGNOSIS — I519 Heart disease, unspecified: Secondary | ICD-10-CM

## 2016-12-27 DIAGNOSIS — Z952 Presence of prosthetic heart valve: Secondary | ICD-10-CM

## 2016-12-27 DIAGNOSIS — I361 Nonrheumatic tricuspid (valve) insufficiency: Secondary | ICD-10-CM

## 2016-12-27 DIAGNOSIS — I342 Nonrheumatic mitral (valve) stenosis: Secondary | ICD-10-CM

## 2016-12-27 DIAGNOSIS — Z9889 Other specified postprocedural states: Secondary | ICD-10-CM | POA: Diagnosis not present

## 2016-12-27 DIAGNOSIS — J811 Chronic pulmonary edema: Secondary | ICD-10-CM

## 2016-12-27 DIAGNOSIS — I272 Pulmonary hypertension, unspecified: Secondary | ICD-10-CM

## 2016-12-27 LAB — GLUCOSE (POCT)
Glucose (POCT): 125 mg/dL — ABNORMAL HIGH (ref 70–99)
Glucose (POCT): 144 mg/dL — ABNORMAL HIGH (ref 70–99)
Glucose (POCT): 152 mg/dL — ABNORMAL HIGH (ref 70–99)
Glucose (POCT): 156 mg/dL — ABNORMAL HIGH (ref 70–99)
Glucose (POCT): 157 mg/dL — ABNORMAL HIGH (ref 70–99)
Glucose (POCT): 159 mg/dL — ABNORMAL HIGH (ref 70–99)
Glucose (POCT): 163 mg/dL — ABNORMAL HIGH (ref 70–99)

## 2016-12-27 LAB — PREPARE/CROSSMATCH PRBCS
Expiration: 201811172359
Expiration: 201811172359
Expiration: 201811202359
Expiration: 201811202359
Expiration: 201811212359
Expiration: 201811222359
Type: O POS
Type: O POS
Type: O POS
Type: O POS
Type: O POS
Type: O POS

## 2016-12-27 LAB — BASIC METABOLIC PANEL, BLOOD
Anion Gap: 10 mmol/L (ref 7–15)
BUN: 11 mg/dL (ref 8–23)
Bicarbonate: 23 mmol/L (ref 22–29)
Calcium: 8.6 mg/dL (ref 8.5–10.6)
Chloride: 106 mmol/L (ref 98–107)
Creatinine: 0.71 mg/dL (ref 0.51–0.95)
GFR: >60 mL/min
Glucose: 163 mg/dL — ABNORMAL HIGH (ref 70–99)
Potassium: 4.1 mmol/L (ref 3.5–5.1)
Sodium: 139 mmol/L (ref 136–145)

## 2016-12-27 LAB — CBC WITH DIFF, BLOOD
ANC-Automated: 6 10*3/uL (ref 1.6–7.0)
Abs Basophils: 0.1 10*3/uL (ref <0.1)
Abs Eosinophils: 0.2 10*3/uL (ref 0.1–0.5)
Abs Lymphs: 0.8 10*3/uL (ref 0.8–3.1)
Abs Monos: 0.8 10*3/uL (ref 0.2–0.8)
Basophils: 1 %
Eosinophils: 3 %
Hct: 33 % — ABNORMAL LOW (ref 34.0–45.0)
Hgb: 10.6 gm/dL — ABNORMAL LOW (ref 11.2–15.7)
Lymphocytes: 9 %
MCH: 27.4 pg (ref 26.0–32.0)
MCHC: 32.1 g/dL (ref 32.0–36.0)
MCV: 85.3 um3 (ref 79.0–95.0)
MPV: 10.3 fL (ref 9.4–12.4)
Monocytes: 11 %
Plt Count: 177 10*3/uL (ref 140–370)
RBC: 3.87 10*6/uL — ABNORMAL LOW (ref 3.90–5.20)
RDW: 15.7 % — ABNORMAL HIGH (ref 12.0–14.0)
Segs: 76 %
WBC: 7.9 10*3/uL (ref 4.0–10.0)

## 2016-12-27 LAB — 2D ECHO WITH IMAGE ENHANCEMENT AGENT IF NECESSARY
AO Mean Gradient: 9 mmHg
Aortic Valve Area: 3.78 cm2
IVC Diameter: 1.5 cm
LA Volume Index: 21.7 ml/m²
LV Ejection Fraction: 68 %
Mitral Mean Gradient: 7 mmHg
PA Pressure: 68 mmHg

## 2016-12-27 LAB — MAGNESIUM, BLOOD: Magnesium: 1.9 mg/dL (ref 1.6–2.4)

## 2016-12-27 LAB — PHOSPHORUS, BLOOD: Phosphorous: 4.1 mg/dL (ref 2.7–4.5)

## 2016-12-27 LAB — PROTHROMBIN TIME, BLOOD
INR: 1.4
PT,Patient: 14.8 s — ABNORMAL HIGH (ref 9.7–12.5)

## 2016-12-27 LAB — LIPOPROTEIN(A), BLOOD: Lipoprotein(a): <6 mg/dL (ref 0–29)

## 2016-12-27 MED ORDER — WARFARIN SODIUM 5 MG OR TABS
5.0000 mg | ORAL_TABLET | Freq: Every evening | ORAL | 3 refills | Status: DC
Start: 2016-12-27 — End: 2018-03-14

## 2016-12-27 MED ORDER — WARFARIN SODIUM 5 MG OR TABS
5.0000 mg | ORAL_TABLET | Freq: Every evening | ORAL | Status: DC
Start: 2016-12-27 — End: 2016-12-27

## 2016-12-27 MED ORDER — ATORVASTATIN CALCIUM 10 MG OR TABS
10.0000 mg | ORAL_TABLET | Freq: Every evening | ORAL | Status: DC
Start: 2016-12-27 — End: 2016-12-27

## 2016-12-27 MED ORDER — LEVOTHYROXINE SODIUM 112 MCG OR TABS
112.0000 ug | ORAL_TABLET | Freq: Every day | ORAL | Status: DC
Start: 2016-12-27 — End: 2016-12-27
  Administered 2016-12-27 (×2): 112 ug via ORAL
  Filled 2016-12-27: qty 1

## 2016-12-27 MED ORDER — HYDROMORPHONE HCL 2 MG OR TABS
2.0000 mg | ORAL_TABLET | ORAL | Status: DC
Start: 2016-12-27 — End: 2016-12-27
  Administered 2016-12-27 (×3): 2 mg via ORAL
  Filled 2016-12-27 (×3): qty 1

## 2016-12-27 MED ORDER — MAGNESIUM OXIDE 400 MG OR TABS
400.0000 mg | ORAL_TABLET | Freq: Once | ORAL | Status: AC
Start: 2016-12-27 — End: 2016-12-27
  Administered 2016-12-27: 400 mg via ORAL
  Filled 2016-12-27: qty 1

## 2016-12-27 MED ORDER — WARFARIN PER PHARMACY
Status: DC
Start: 2016-12-27 — End: 2016-12-27
  Filled 2016-12-27: qty 1

## 2016-12-27 MED ORDER — FUROSEMIDE 20 MG OR TABS
20.0000 mg | ORAL_TABLET | Freq: Every day | ORAL | Status: DC
Start: 2016-12-27 — End: 2016-12-27
  Administered 2016-12-27: 20 mg via ORAL
  Filled 2016-12-27: qty 1

## 2016-12-27 MED ORDER — LATANOPROST 0.005 % OP SOLN
1.0000 [drp] | Freq: Every evening | OPHTHALMIC | Status: DC
Start: 2016-12-27 — End: 2016-12-27
  Filled 2016-12-27: qty 2.5

## 2016-12-27 MED ORDER — LEVOTHYROXINE SODIUM 112 MCG OR TABS
112.0000 ug | ORAL_TABLET | Freq: Every day | ORAL | Status: DC
Start: 2016-12-28 — End: 2016-12-27

## 2016-12-27 NOTE — Discharge Instructions (Signed)
Diagnosis and Reason for Admission    You were admitted to the hospital for treatment of your Aortic Valve Stenosis     Your full diagnosis list is located on this After Visit Summary in the Hospital Problems section.    What [facility] Stay    The main treatment done for you during this hospitalization was Transcatheter Aortic Valve Replacement      The following evaluation is still important to complete after discharge from the hospital:    Do NOT miss your blood thinner medications  Instructions for After Discharge    Your diet at home should be a low-salt, low-fat and heart-healthy diet.    Your should limit your activity at home for the short term, as listed below:  -- Avoid significant exertion for the next 7 days.  -- No heavy lifting (more than 10 pounds) for the next 7 days.  -- You can start or restart an exercise routine after 10 days.    Wound care instructions:  -- Do not submerge your catheterization site wound for the next 7 days.  This includes tub baths, pools, hot tubs, or going swimming at the beach.  -- It is OK to shower starting the day after the procedure.  You may use soap and water to clean the wound area. Do not scrub the areas    Your medication list is located on this After Visit Summary in the Current Discharge Medication List section.  Your nurse will review this information with you before you leave the hospital.    It is very important for you to keep a current medication list with you in order to assist your doctors with your medical care.  Bring this After Visit Summary with you to your follow up appointments.    Reasons to Contact a Doctor Urgently    Call 911 or return to the hospital immediately:  -- If you have chest pain or discomfort not relieved by 2 nitro sprays or tablets.  -- If you have chest pain episodes more often or episodes that are more intense.  -- If you have NEW symptoms of trouble sleeping flat on your back due to shortness of breath.  -- If  you experience any bleeding     You should contact either your primary care physician or your hospital cardiologist for any of the following reasons:   -- Increased swelling of the legs, ankles, or feet.  -- Weight gain of 3 or more pounds in 3 days or less.  -- Severe abdominal pain or new back pain after your procedure.  -- Bleeding, swelling, bruising, or increased pain at the puncture site.  -- Fever or increasing redness or drainage at the puncture site.  -- Increasing fatigue or decreasing ability to do usual daily activities.    If you have any questions about your hospital care, your medications, or if you have new or concerning symptoms soon after going home from the hospital, and you need to contact your hospital cardiologist, your hospital cardiologist can be contacted in the following manner:    Dr. Parke Poisson:  During daytime hours, call [phone].  After hours, call [phone] and have the operator page the Interventional Cardiology fellow on call.    [facility] @ [phone] or after hours [phone] and request to speak with the cardiology fellow on call for any urgent situation, if it's an emergency, please call 911 or go to the nearest emergency department, if possible come  to a [facility].    Once you are able to see your primary care physician (PCP), your PCP will then be responsible for further medication refills, or appointment referrals.    What Needs to Happen Next After Discharge -- Appointments and Follow Up    Any appointments already scheduled at [facility] clinics will be listed in the Future Appointments section at the top of this After Visit Summary.  Any appointments that have been requested, but have not yet been scheduled, will be listed below that under Post Discharge Referrals.    Sometimes tests performed in the hospital do not yet have results by the time a patient goes home.  The following key tests will need to be followed up at your next  appointment: None    Medical Home Information    Your primary care provider or clinic currently on file at [facility] is: [facility]    If we are assigning you a new medical home for your follow up after discharge, that information will appear here:       [facility]   You will need to have a follow up appointment with Dr. Parke Poisson in 4-6 weeks  You will need to have an ECHO and ECG scheduled prior to that clinic visit.  You will need to have lab work done before your appointment with Dr. Parke Poisson   If needed, please follow up with your Cardiologist Dr. Robina Ade prior to your follow up with Dr. Judithe Modest need to go to the [facility] man out patient lab connected to [facility] before your clinic visit. You do not need an appointment for this blood work and you do not need to fast  If these appointments do not appear on your discharge paperwork, our office will call and notify you of these appointments.   Any Questions please call  Lavonia Drafts NP [phone]

## 2016-12-27 NOTE — Interdisciplinary (Signed)
PT Contact       12/27/16 0727    Therapy Contact Note    Contact Time 0727    Therapy not provided at this time as Patient with strict bedrest activity orders and unable to participate.

## 2016-12-27 NOTE — Interdisciplinary (Signed)
Physical Therapy Evaluation and Discharge    Admitting Physician:  Lacretia Nicks, MD  Admission Date 12/26/2016    Inpatient Diagnosis:   Visit Diagnoses       Codes    S/P AVR     ICD-10-CM: Z95.2  ICD-9-CM: V43.3    Decreased functional mobility and endurance     ICD-10-CM: Z74.09  ICD-9-CM: 780.99          Preferred [place]    Start of Service  Start of Care: 12/27/16  Onset date : 12/26/2016  Reason for referral: Decline in functional mobility              Inpatient Precautions/Contraindications  Precautions: None    Past Medical History:   Diagnosis Date    Back pain     Needs L4 fusion    Depression     Diabetes (CMS-HCC)     Fibromyalgia     GERD (gastroesophageal reflux disease)     Glaucoma     Hypercholesterolemia     Hypertension     Hypothyroidism     Migraines     PAF (paroxysmal atrial fibrillation) (CMS-HCC)       Past Surgical History:   Procedure Laterality Date    BILE DUCT STENT PLACEMENT      CHOLECYSTECTOMY      COLONOSCOPY  05/31/2012    normal mucosa with internal hemorrhoids.     ESOPHAGOGASTRODUODENOSCOPY  03/11/2015    HYSTERECTOMY      LIVER BIOPSY  05/22/2015    steatohepatitis with periportal and focal septal fibrosis(stage 2-3 of 4). Small focus of benign epithelial lining suggestive of benign cyst, with adjacent chronic inflammation, reactive fibrosis, and hemosiderin depostition B. Liver asparte fluid: hypocellular specimen. Rare benign hepatocysts and macrophages. No malignant cells identified.    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     Fall history No reported falls in the last 6 months       12/27/16 1600       Functional History    Prior Level of Function Minimal deficits     Functional Mobility Assistance  Needs None-Independent with mobility     General ADL/Self-Care Assistance Needs None- Independent with ADLs and self care     Other Information limited to house level mobility 2/2 marked deconditioning       12/27/16 1600       Social History    Living Situation Lives with parent/family     Home Environment Single story home     Home accessibility  Accessible with wheelchair or walker       12/27/16 1600       Subjective    Subjective Information I'm worried about my pain       12/27/16 1600       Inpatient readiness for treatment    Patient status Patient agreeable to treatment;Nursing in agreement for treatment     Patient premedicated prior to treatment Yes       12/27/16 1600       Pain Assessment    Pain Asssessment Tool Numeric Pain Rating Scale  12/27/16 1600       Numeric Pain Rating Scale     Pain Intensity - rating at present 3     Pain Intensity - rating at worst  3     Pain Intensity- rating after treatment 3     Frequency  Constant     Description  Aching     Location  chest and back             PT Functional       12/27/16 1600    [place] AM-PAC: Basic Mobility    Assistance Needed to Turn from Back to Side While in a Flat Bed Without Using Bedrails 4    Difficulty with Supine to Sit Transfer 4    How Much Help Needed to Move to/from Bed to Chair 4    Difficulty with Sit to Stand Transfer from Chair with Arms 4    How Much Help Needed to Walk in Room 3    How Much Help Needed to Climb 3-5 Steps with a Rail 3    AMPAC Total Score 22    Assessment: AM-PAC Basic Mobility - Current Functional Impairment Level Score 19-22                        Activity Tolerance    General Activity Tolerance FAIR+ limited by self limiting behavior, feelings of   Vitals: stable throughout          Cognition Assessment    Cognition Restrictions No cognitive deficits  Anxious throughout; but stable cognitive          Communication Assessment    Communication Restrictions No communication limitations or impairments noted.  Current status of hearing, speech and vision allow functional communication.            Coordination/Motor Control Assessment    Coordination/Motor Control Restrictions No coordination limitations or impairments noted.                Extremity Assessment - General    Extremity Restrictions No lower extremity strength, ROM, or tone limitations or impairments noted.            Functional Mobility - Detailed    Patient Participation in Mobilization Activities Patient agreeable to participate in mobility/or out of bed activities    Rolling IND    Supine to Sit IND    Sit to Supine IND    Sit to Stand IND    Stand to Sit IND    Bed to Chair SUP    Transfer Device none    Transfer Technique Sit<>stand     Other           Gait    Gait Assessment     Assistance Level SUP    Assistance Device none    Distance Ambulated Today  400'    Stair/Step Management     Number of Steps Navigated Today     General Gait Analysis Stable and steady paced gait    Other           Balance Assessment    Balance Restrictions     Static Sitting Balance GOOD   Time tolerated:    Dynamic Sitting Balance GOOD     Static Standing Balance GOOD   Time tolerated:    Dynamic Standing Balance GOOD                      Perception  and Postural Alignment           Perception and Postural Restrictions No significant deviations, limitations or impairments noted          Sensation - General    Sensation - General  Intact - no limitations or impairments noted throughout                       -       12/27/16 1600       Patient/Family Education    Learner(s) Patient;Family     Patient/family training in appropriate therapeutic interventions Completed this visit     Education Topic(s) Activity restrictions and precautions;Exercise Program;Mobility;Typical activity progression during hospitalization       12/27/16 1600       Assessment     Assessment  Patient is a 66 yo female S/P: 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 Patient has  demonstrated safe and independent bed mobility, transfers, gait and stair performance. Educated on huffing, splinting, and relaxation breathing techniques with good effect.  Patient has no further acute care physical therapy needs and is cleared by physical therapy to D/C to prior living situation when medically ready.        Rehab Potential Good       12/27/16 1600       Functional Limitation Reporting     Rationale Acceptable functional assessment     Assessment: AM-PAC Basic Mobility - Current Functional Impairment Level Score 19-22     Visit type Initial completed     Impairment Category - One time only Mobility     Mobility: Walking and Moving Around Current Status ([id number]) CJ     Mobility: Walking and Moving Around Goal Status [phone]) CJ     Mobility: Walking and Moving Around Discharge Status [phone]) CJ       12/27/16 1600       Patient stated Goal    Patient stated goal go home       12/27/16 1600       Therapy Goals    Therapy Goals Goal 1;Goal 2       12/27/16 1600       Goal 1    Goal 1 Patient will demonstrate SUP-IND for all functional mobility tasks including ambualtion >150' without AD     Number of visits 1-3     Goal Status New;Met       12/27/16 1600       Goal 2    Goal 2 Patient and family will demonstrate good understanding of airway clearance techniques and relaxation  exercises     Number of visits 1-3     Goal 2 New;Met       12/27/16 1600       Treatment Plan    Frequency of treatment One time only, further treatment not indicated     Duration of treatment One time only, further treatment not indicated       12/27/16 1600       Therapy Plan Communication    Therapy Plan Communication Discussed therapy plan with Nursing;Discussed therapy plan with Physician       12/27/16 1600       Post Acute Discharge Considerations    Anticipated required level of cognitive assistance None     Anticipated required level of safety assistance Distant supervision     Anticipated required level of physical  assistance None  Anticipated physical barriers impacting discharge None     Anticipated  level of therapy tolerance and ongoing therapy needs Outpatient   [facility]       12/27/16 1600       Inpatient Patient Safety Considerations    Patient call light within reach Yes     Patient left sitting  In a chair     The patient may be at risk for falls No     Family/caregiver present for treatment Child/children       12/27/16 1600       Treatment provided today    Payor  MediCal       12/27/16 1600       MediCal Evaluation    Medi-Cal Eval Initial 30 minutes (3920) Completed     Medi-Cal eval 15 min addtl (V7944)           2       12/27/16 1600       MediCAL Treatment today    Therapeutic Activities   Patient education;Facilitation of safety awareness/responses during functional tasks;Graded physicial assist for functional activities Cough and Huffing education; splinting technique and education with verbal and demonstrative cues for huffing to decrease pain with good effect and productive secretions post huffing practice.  Deep breathing education; demonstration, cues and practice; verbal and tactile cues for muscular reeducation to assist lateral ribs for increase thoracic expansion with inhalation.   Educated on value of increased thoracic expansion for relaxation.   UE AROM  with timed breathing        12/27/16 1600       Treatment Time     Treatment start time 1530     Total TIMED Treatment  (min) 60     Total Treatment Time (min) 60            The physical therapist of record is endorsed by evaluating physical therapist.

## 2016-12-27 NOTE — Interdisciplinary (Signed)
12/27/16 0947   Initial Assessment   CM Initial Assessment Completed   Patient Information   Where was the patient admitted from? Home   Prior to Level of Function Ambulatory/Independent with ADL's   Primary Caretaker(s) Family   Primary Contact Name, Number and Relationship son [name] [phone]   Permission to [facility] Other (Comment)   Financial Resources Medi-Cal  (Care 1st)   Veter[employer] Affiliation No   Discharge Planning   Living Arrangements Family members   Available Assistance/Support System Family member(s)   Type of Residence One Story Home   Barriers to Discharge Awaiting clinical improvement   Patient/Family Engaged in Discharge Planning Yes   Patient Has Decision Making Capacity Yes   Patient/Family/Legal/Surrogate Decision Maker Has Been Given Options And Choice In The Selection of Post-Acute Care Providers Yes   Family/Caregiver's Assessed for Readiness to provide care to the patient   Patient/Family Are In Agreement With Discharge Plan yes   Public Health Clearance Needed No   Social Worker Consult   Do you need to see a Education officer, museum?  Yes   Readmission Risk Assessment   Readmission Within 30 Days of Discharge No   Recent Hospitalizations (Within Last 6 Months) No   High Risk For Readmission No   Action Taken To Prevent Readmission After Discharge Home health referral;PT OT ST evaluation and recommendation;Dietary recommendations;Hospital pharmacy intervention at the bedside   Recommendations to the Physician Home health orders   Pt is s/p MVR, lives family in [facility], in [place]. No clear dc needs at this time, CM to continue to f/u.

## 2016-12-27 NOTE — Discharge Summary (Signed)
Patient Name:  Cathy Lucas    Principal Diagnosis (required): Mitral stenosis S/P Valve in Valve Transcatheter Mitral Valve replacement     Hospital Problem List (required):  Active Hospital Problems    Diagnosis    S/P mitral valve repair [D98.338]    Mitral stenosis [I05.0]    Pulmonary HTN (CMS-HCC) [I27.20]    Hypothyroidism [E03.9]    Fibromyalgia [M79.7]      Resolved Hospital Problems    Diagnosis   No resolved problems to display.       Additional Hospital Diagnoses ("rule out" or "suspected" diagnoses, etc.):  None    Principal Procedures During This Hospitalization (required):  PROCEDURE PERFORMED:  1. Catheterization of the right femoral vein.  2. Atrial septal puncture.  3. Percutaneous balloon mitral valvuloplasty.  4. 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  5. Transvenous pacemaker placement.  6. Figure-of-8 suture deployment.    Other Procedures Performed During This Hospitalization (required):  Echocardiography 12/27/16 Post TMVR  Mitral MG 7.0, EF 68%, PAP 68  2D MEASUREMENTS (normal ranges within parentheses):    Left Ventricle:      Normal  Aorta/Left Atrium:       Normal  IVSd:      0.94 cm (0.6-0.9) Aortic Root:    3.50 cm  (2.4-3.6)  LVPWd:     0.97 cm (0.6 -0.9) Aortic Root, index: 1.92 cm/m (1.4-2.2)  LVIDd:     4.04 cm (3.8-5.2) Ascending Aorta:  3.30 cm  (1.9-3.5)  LVIDs:     2.53 cm (2.2-3.5) Asc Aorta, index:  1.81 cm/m (1.0-2.2)  `LV EF MOD BP:  68 %, (54%-74%) Left Atrium:    4.20 cm    Right Ventricle:          LA Volume MOD:          Normal  TAPSE (2D):   1.34 cm      LA Vol A4C MOD:  44.0 ml  RV S' Vmax:   0.08 m/s     LA Vol A2C MOD:  33.9 ml                   LA Volume BP:   39.5 ml mL  (= 52 mL)                   ,`LA Vol Index BP: 21.7 ml/m, (= 28 mL/m)    LV Diastolic Function:  MV Peak E: 1.80 m/s MV  e' (lateral):   0.06 m/s RUPV S Vmax: 0.46 m/s  MV Peak A: 1.05 m/s MV e' (medial):    0.05 m/s RUPV D Vmax: 0.39 m/s  E/A Ratio: 1.71   E/e' ratio (lateral): 30.00           E/e' ratio (medial): 36.00      SPECTRAL DOPPLER ANALYSIS (where applicable):  Mitral Valve:      LV Outflow Tract:              LVOT SV:    150.3 ml  MV Max Vel:  2.15 m/s LVOT SV Index: 82.6 ml/m  `MV Mean Grad: 7.0 mmHg,      Aortic Valve: AoV Max Vel: 2.03 m/s AoV Peak PG:     16.5 mmHg ,`AoV Mean PG: 9.0 mmHg,  LVOT Vmax:  1.47 m/s LVOT VTI:      0.283 m  LVOT Diameter: 2.60 cm  ,`AoV Area: 3.78 cm, Dimensionless index: 0.72  Tricuspid Valve and PA/RV Systolic Pressure: TR Max Velocity: 4.02 m/s ,`RVSP/PASP:  68 mmHg,  RA Pressure:   3 mmHg  ,`IVC Diameter: 1.50 cm,      --------------------------------------------------------------------------------  PHYSICIAN INTERPRETATION:    Left Ventricle: The left ventricular size is normal. There is normal left ventricular function. Left ventricular ejection fraction by Simpson's biplane is 68 %. There is no left ventricular hypertrophy. Indeterminate left ventricular diastolic function.    Right Ventricle: The right ventricular size is normal. Global right ventricular systolic function is reduced. Severe pulmonary hypertension with right ventricular systolic pressure measuring 68 mmHg.    Left Atrium: The left atrium is normal sized.    Right Atrium: The right atrium is normal in size.    Pericardium: There is no evidence of pericardial effusion.    Mitral Valve: No evidence of mitral valve regurgitation. A tissue prosthetic valve is present. Bioprosthetic mitral valve with a mean gradient of 7 mmHg.    Tricuspid Valve: The tricuspid valve is normal in structure. Mild tricuspid regurgitation.    Aortic Valve: The aortic valve appeared normal (trileaflet). Aortic valve sclerosis is present. No evidence of aortic  valve regurgitation is seen.    Pulmonic Valve: The pulmonic valve is normal. Trace pulmonic valve regurgitation.    Aorta: The aortic root is normal measuring 3.50 cm and 1.92 cm/m index. The ascending aorta is normal measuring 3.30 cm and 1.81 cm/m index.    Pulmonary Artery: The pulmonary artery is of normal size and origin.    Venous: The inferior vena cava was normal sized with respiratory variation greater than 50%.    Summary:  1. Bioprosthetic mitral valve with a mean gradient of 7 mmHg.  2. The left ventricular size is normal and the left ventricular systolic function is normal.  3. The right ventricular size is normal and systolic function is mildly reduced.  4. Mild tricuspid regurgitation.  5. Severe pulmonary hypertension with right ventricular systolic pressure measuring 68 mmHg.  6. Compared to prior study s/p MVR with improved mean gradient, was ~ 20 mmHg and now 7 mmHg. Estimated RVSP is lower.    Procedure results are available in Chart Review in Epic.  For those providers external to [facility], the key procedure results are listed below:  TMVR 12/26/16  DESCRIPTION OF PROCEDURE IN DETAIL:   The patient was brought to the hybrid operating room in the fasting state and prepped and draped in a standard sterile fashion. After induction of general anesthesia, she was further prepped and draped in the standard sterile fashion. A transesophageal echocardiogram probe was placed and adequate images were obtained of all relevant cardiac structures. Next, using the modified Seldinger technique, a 6-French ACT sheath was placed in the right femoral vein. A 5-French ACT sheath was then placed into the left femoral vein utilizing the modified Seldinger technique, through which a standard 5-French St. Jude's temporary pacemaker was advanced and positioned within the right ventricle, with excellent electrical capture. A 5-French ACT sheath was then placed into the left femoral artery for pressure monitoring  through the procedure. The 6-French sheath within the R femoral vein was then exchanged for an 8-French SL1 sheath. Next, heparin was administered for anticoagulation with the peak ACT being greater than 300 seconds. A transseptal puncture was then performed using a standard Baylis needle through the [facility] catheter under ultrasound and fluoroscopic guidance in an inferior posterior portion of the septum. After crossing the septum, the catheter was hooked up to pressure  and atrial location was confirmed. The SL1 wire was then removed and exchanged for a 300 cm super stiff Amplatz wire, which was directed into the upper lateral pulmonary vein. The SL1 catheter was removed, and the 14-French Edwards sheath was advanced over the wire and positioned within the inferior right atrium. Next a 5-French JR4 catheter was advanced and used to exchange the Amplatz wire for a Bentson wire. This was directed across the mitral valve, and the JR4 was advanced into the left ventricle. The Bentson wire was then removed and exchanged for the super stiff Amplatz wire. Next a 12 mm x 40 mm Mustang OTW balloon was advanced and used to dilate the atrial septum at a pressure of 2 atmospheres. The balloon was then advanced further across the mitral valve and used to perform balloon valvuloplasty at a pressure of 10 atmospheres. The balloon was subsequently removed. A 26-mm valve catheter was then advanced over the stiff wire into the left atrium under fluoroscopic guidance. The valvewas lined up with aid of fluoroscopically and echocardiography deployed in the usual fashion while rapidly pacing the ventricle. Subsequently, echocardiography did not demonstrate any significant MR. The valve delivery equipment was thus successfully removed via the right femoral access site after exchanging the stiff wire for a regular J wire. The temporary pacemaker was then removed. A figure-of-eight stitch was then placed over the site after removal of the  Edwards sheath.     CONCLUSIONS  1. Successful placement of a 26 mm Sapien 3 valve via the R femoral venous/transseptal approach       Consultations Obtained During This Hospitalization:  Case Management   12/27/16 0947   Initial Assessment   CM Initial Assessment Completed   Patient Information   Where was the patient admitted from? Home   Prior to Level of Function Ambulatory/Independent with ADL's   Primary Caretaker(s) Family   Primary Contact Name, Number and Relationship son [name] [phone]   Permission to [place] Other (Comment)   Financial Resources Medi-Cal  (Care 1st)   Veterans Affiliation No   Discharge Planning   Living Arrangements Family members   Available Assistance/Support System Family member(s)   Type of Residence One Story Home   Barriers to Discharge Awaiting clinical improvement   Patient/Family Engaged in Discharge Planning Yes   Patient Has Decision Making Capacity Yes   Patient/Family/Legal/Surrogate Decision Maker Has Been Given Options And Choice In The Selection of Post-Acute Care Providers Yes   Family/Caregiver's Assessed for Readiness to provide care to the patient   Patient/Family Are In Agreement With Discharge Plan yes   Public Health Clearance Needed No   Social Worker Consult   Do you need to see a Education officer, museum?  Yes   Readmission Risk Assessment   Readmission Within 30 Days of Discharge No   Recent Hospitalizations (Within Last 6 Months) No   High Risk For Readmission No   Action Taken To Prevent Readmission After Discharge Home health referral;PT OT ST evaluation and recommendation;Dietary recommendations;Hospital pharmacy intervention at the bedside   Recommendations to the Physician Home health orders   Pt is s/p MVR, lives family in [facility] in [place]. No clear dc needs at this time, CM to continue     PHYSICAL THERAPY   Post Acute Discharge Considerations     Anticipated required level of cognitive assistance None     Anticipated  required level of safety assistance Distant supervision  Anticipated required level of physical assistance None     Anticipated physical barriers impacting discharge None     Anticipated  level of therapy tolerance and ongoing therapy needs Outpatient   [facility] cardiac rehab            Reason for Admission to the Hospital / History of Present Illness:  Admission after interventional cardiology procedure(s) listed above.   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 She is now S/P Successful placement of a 26 mm Sapien 3 valve  In the mitral position via the R femoral venous/transseptal approach. She tolerated the procedure well, without any complications. She was admitted under ICU observation over night. She remained stable and was ready to be transferred to Med/Surg-tele on post procedure day #1. She continued to work with physical therapy and was able to walk the unit with minimal assist or complaint. She was ready for discharge on post procedure day #1    Hospital Course by Problem (required):  1. Severe Mitral Stenosis: S/P Valve in Valve TMVR 12/26/16  --Follow up with Dr. Parke Poisson 4-6 weeks post TAVR procedure (will call patient with appt.)  --Will have ECHO/ECG/BMP/CBC prior to that clinic appointment on same day  --Follow up with Dr. Robina Ade as needed and following post TAVR follow up in 4-6 weeks  --Anticoagulation: ASA 81 mg daily; warfarin 5 mg x 3 days at night and  then 2.5 mg nightly. Instructed to continue plavix as well until Monday 01/01/17  --Plan to continue warfarin for 3-6 months as tolerated and pending results of 30 day ECHO and once warfarin therapy completed, would recommend resuming the plavix with the ASA  --Order given to go to outside lab for INR check this weekend, and latest Monday. Family to follow up with Dr. Elvin So office or PCP to see if INR can be monitored closer to home, otherwise the patient will have to commute for coumadin clinic here at [place], as it is too difficult to monitor it from an out side lab  --Decreased her lasix from 40 mg daily to 20 mg Daily, advised to monitor weight, if needed can be titrated up or down.   --Can continue stool softners and laxatives as needed to prevent constipation   --Encourage continued use of incentive spirometry upon discharge   --Referred to cardiac rehab here at [place], but may need physical therapy closer to home in able   --Discharge on 12/27/16    2. PH: stable  --Will follow up with ECHO in 30 days, if needed will refer again to [facility] clinic here at [place]     3. HTN:   --Controlled upon discharge, will resume her Coreg 25 mg BID     4. Hypoglycemia:  --Advised to follow up as out patient for further management and evaluation post TMVR      5. Chronic pain:  --Advised to F/U with pain specialist as out patient for further evaluation and management  --Continue with home regimen of Belbuca and Dilaudid for now.     6. Disposition:   --  Discharged home with family     Key Laboratory and ECG Findings at Discharge:  Serial ECGs following the procedure were unchanged from the pre-procedure ECG.  Lab Results   Component Value Date    NA 139 12/27/2016    K 4.1 12/27/2016    CL 106 12/27/2016    BICARB 23 12/27/2016    BUN 11 12/27/2016    CREAT 0.71 12/27/2016    GLU 163 (H) 12/27/2016    [place] 8.6 12/27/2016     Lab Results   Component Value Date    WBC 7.9 12/27/2016    RBC 3.87 (L) 12/27/2016    HGB 10.6 (L) 12/27/2016     HCT 33.0 (L) 12/27/2016    MCV 85.3 12/27/2016    MCHC 32.1 12/27/2016    RDW 15.7 (H) 12/27/2016    PLT 177 12/27/2016    MPV 10.3 12/27/2016     Lab Results   Component Value Date    AST 19 12/26/2016    ALT <5 12/26/2016    ALK 79 12/26/2016    TP 5.5 (L) 12/26/2016    ALB 3.5 12/26/2016    TBILI 0.63 12/26/2016    DBILI <0.2 08/30/2016     Tests Outstanding at Discharge Requiring Follow Up:  None    Discharge Condition (required):  Stable.    Key Physical Exam Findings at Discharge:  Blood pressure 130/69, pulse 83, temperature 98.2 F (36.8 C), resp. rate 18, height _0  (1.6 m), weight 78.6 kg (173 lb 4.5 oz), SpO2 96 %, not currently breastfeeding.  General: Well developed, well nourished, in no apparent distress.  Lungs: Clear breath sounds bilaterally. slightly diminished at the bases   CV: Normal rate, regular rhythm, no M/R/G/C  Abdomen: Soft, nontender, normal bowel sounds present.  Extremities: warm and well-perfused, no clubbing, cyanosis or edema  Bilateral Groins with dressings removed, no sutures present. No hematoma, no bleeding, no drainage, no swelling, no S&S infection, minimal TTP   Pulses palpable     Discharge Diet:  Low-salt and Low-fat / cardiac.    Discharge Medications:     What To Do With Your Medications      START taking these medications       Add'l Info    warfarin 5 MG tablet   Commonly known as:  COUMADIN   Take 1 tablet (5 mg) by mouth nightly. Take 5 mg nightly for 3 days and then 1/2 tablet nightly    Quantity:  90 tablet   Refills:  3         CONTINUE taking these medications       Add'l Info    aspirin 81 MG tablet   Take 81 mg by mouth daily.    Refills:  0       atorvastatin 10 MG tablet   Commonly known as:  LIPITOR   Take 10 mg by mouth daily.    Refills:  0       BELBUCA 150 MCG Film   Suck 150 mcg in mouth 2 times daily.   Generic drug:  Buprenorphine HCl    Refills:  0       carvedilol 25 MG tablet   Commonly known as:  COREG   Take 25 mg by mouth 2 times daily (with  meals).    Refills:  0       diphenhydrAMINE 25 MG capsule   Commonly known as:  BENADRYL   Take 50 mg by  mouth every 6 hours as needed for Itching.    Refills:  0       furosemide 20 MG tablet   Commonly known as:  LASIX   Take 20 mg by mouth as needed.    Refills:  0       HYDROmorphone 2 MG tablet   Commonly known as:  DILAUDID   Take 2 mg by mouth every 4 hours as needed for Moderate Pain (Pain Score 4-6).    Refills:  0       latanoprost 0.005 % ophthalmic solution   Commonly known as:  XALATAN   Place 1 drop into both eyes every evening.    Refills:  0       levothyroxine 100 MCG tablet   Commonly known as:  SYNTHROID   Take 112 mcg by mouth daily.    Refills:  0       ondansetron 8 MG tablet   Commonly known as:  ZOFRAN   Take 8 mg by mouth every 8 hours as needed for Nausea/Vomiting.    Refills:  0       pantoprazole 40 MG tablet   Commonly known as:  PROTONIX   Take 40 mg by mouth daily.    Refills:  0       VITAMIN D PO   Take 50,000 capsules by mouth once a week.    Refills:  0       zolpidem 5 MG tablet   Commonly known as:  AMBIEN   Take 5 mg by mouth nightly as needed for Insomnia.    Refills:  0         STOP taking these medications          clopidogrel 75 MG tablet   Commonly known as:  PLAVIX       nitroGLYcerin 2.5 MG Controlled-Release capsule       STOOL SOFTENER & LAXATIVE 8.6-50 MG tablet   Generic drug:  senna-docusate            Where to Get Your Medications      These medications were sent to [facility], [address] [place] [address]     Phone:  [phone]     warfarin 5 MG tablet             Allergies:  Allergies   Allergen Reactions    Ceftriaxone Rash    Iodine Rash    Iv Contrast [Contrast Media] Anaphylaxis     Itching swelling unable to breathe      Latex Rash    Metformin Rash    Ativan [Lorazepam] Other     Heart palpitations      Januvia [Sitagliptin] Other     Sob, chest pain, heart palpitations      Macrobid [Nitrofurantoin]  Unspecified    Tylenol [Acetaminophen] Other     bc of the lesion of the liver.       Discharge Disposition:  Home.    Discharge Code Status:  Full code / full care  This code status is not changed from the time of admission.    Follow Up Appointments:    Scheduled appointments:  Future Appointments  Date Time Provider [place]   02/09/2017 12:45 PM [facility] MRI 2 [facility]   04/18/2017 9:30 AM [place], Kayleen Memos, MD [place] [facility]   will call patient with her 65  day follow up appointments.     For appointments requested for after discharge that have not yet been scheduled, refer to the Post Discharge Referrals section of the After Visit Summary.    Discharging 59 Contact Information:    Dr. Parke Poisson:  During daytime hours, call [phone].  After hours, call [phone] and have the operator page the Interventional Cardiology fellow on call.

## 2016-12-27 NOTE — Interdisciplinary (Signed)
Patient is requesting if she can be seen by PT and OT for evaluation if she can go home today, she said she will rest better on her own posturepedic bed. PT and OT paged.

## 2016-12-27 NOTE — Interdisciplinary (Signed)
OTContact       12/27/16 0804    Therapy Contact Note    Therapy not provided at this time as Patient with strict bedrest activity orders and unable to participate

## 2016-12-27 NOTE — Interdisciplinary (Signed)
Seen By Lavonia Drafts PA, talked to  Patient and her son [name] re: D/C plan. R and L groin dressing D/C'd by Jackelyn Poling, band aid applied, no bleeding to site , ecchymosis to bilateral groins. Pedal pulses palpable.

## 2016-12-27 NOTE — Interdisciplinary (Signed)
OTContact       12/27/16 1559    Therapy Contact Note    Occupational Therapy Screening Assessment Upon patient interview, patient notes functional status is at baseline level and has no new deficits. Occupational Therapy evaluation and treatment not recommended.    Additional Comments Per PT, patient without IP OT needs at this time. OT order completed. Thank you for the consult.

## 2016-12-27 NOTE — Interdisciplinary (Signed)
Received report from ICU RN.  Endorsed care to day shift RN Cleon Gustin to resume care for pt.

## 2016-12-27 NOTE — Plan of Care (Signed)
Problem: Bleeding, Risk of  Goal: Absence of active bleeding  Outcome: Progressing  Pt has no signs on active bleeding. H&H stable. Groin puncture sites have no drainage. Will continue to monitor puncture sites Q6H per order.    Problem: Tissue Perfusion, Cardiopulmonary - Altered  Goal: Hemodynamic stability  Outcome: Progressing  Pt is NSR in the 70s-80s with one episode of 6 beats of Vtach. Pt has occasional PVCs, electrolytes were checked and are being monitored. Pt is afebrile. No SOB or CP. Pt's SBP 120-130s. Pt has chronic back pain, which is exacerbated by being in the hospital bed and on strict bedrest. Pt is on 2L NC, with O2 sats in the high 90s. Will continue to monitor.

## 2016-12-27 NOTE — Progress Notes (Signed)
Inpatient Warfarin Target Drug Monitoring Note    Cathy Lucas is a 66 year old female on anticoagulation therapy for Mitral Valve Replacement.    INR Goal: 2.0 - 3.0    Current Dose: New start    Labs:  Recent Labs      12/26/16   1030  12/26/16   1526  12/27/16   0028  12/27/16   1600   INR   --    --    --   1.4   HGB  10.7*  10.8*  10.6*   --    HCT  32.7*  32.6*  33.0*   --    PLT  189  176  177   --    CREAT  0.68  0.62  0.71   --      Albumin   Date Value Ref Range Status   12/26/2016 3.5 3.5 - 5.2 g/dL Final     AST (SGOT)   Date Value Ref Range Status   12/26/2016 19 0 - 32 U/L Final     ALT (SGPT)   Date Value Ref Range Status   12/26/2016 <5 0 - 33 U/L Final     Bilirubin, Tot   Date Value Ref Range Status   12/26/2016 0.63 <1.2 mg/dL Final       Drug/Disease Interactions and Assessments:    - Potential Drug-Drug Interactions:    Synthroid can potentially increase effect of warfarin    - Potential Drug-Disease Interactions:    None    - Other Anticoagulants:   Current Facility-Administered Medications   Medication Dose Frequency    WARFARIN PER PHARMACY   As Directed      Aspirin 81 mg daily    - Bleeding Assessment:   H/H is stable. Team starting warfarin today.    - Current Nutrition Assessment:    Recent changes in nutrition/diet? no   Diet Custom: Heart Healthy / Low Fat; With Caffeine; Carb Limited (4 Carbs)      Assessment/Plan:    INR is baseline higher at 1.4.    Will recommend starting dose as follows: warfarin 5 mg tonight.    If patient is being discharged, outpatient dosing recommendations are as follows: consult pharmacist.    Pharmacy will continue to monitor closely and order INR daily or as appropriate.     Carney Living, PharmD, [facility]  Inpatient Staff Pharmacist

## 2016-12-27 NOTE — Progress Notes (Signed)
CT Surgery TMVR Note     POD# 1  S/P: Percutaneous mitral valve replacement with a 26 mm Sapien 3 valve.  PMH: 1. 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 Severe symptomatic mitral stenosis  3. Moderate/severe mitral regurgitation  4. Severe pulmonary hypertension  5. Paroxysmal atrial fibrillation    - Patient seen and examined, groin C/D/I. Mild tenderness and edema. No hematoma  - Labs reviewed  - CXR: clear    - Hemodynamically stable on gtts- NSR. Echo pending  - On o2 via nc- 2L  - continue current plan of care and transfer to floor    Jolyne Loa, PA-C  12/27/16 9:45 AM

## 2016-12-27 NOTE — Interdisciplinary (Signed)
Ambulated in the hallway with PT, tolerated well. NSR 90s. Medicated for back pain as ordered.

## 2016-12-27 NOTE — Interdisciplinary (Signed)
Received from ICU with her son, alert and oriented x 4, lower back pain 5/10, pt said she she's okey with 5/10 and can wait for next pain med at 1600. NSR 80's, call bell within reach, son [name] at bedside.

## 2016-12-27 NOTE — Interdisciplinary (Signed)
Written discharge instructions and medication administration plan reviewed to patient and son [name].  Patient and son was able to teach back. Written discharge instructions given to patient and son. It contains information on diet, activity, a current list of medications, follow-up appointments, weight monitoring and signs and symptoms to report to MD. Verbalized understanding of instructions.

## 2016-12-28 ENCOUNTER — Telehealth (HOSPITAL_BASED_OUTPATIENT_CLINIC_OR_DEPARTMENT_OTHER): Payer: Self-pay

## 2016-12-28 NOTE — Telephone Encounter (Signed)
Transitional Telephonic Nurse (TTN ) Post-Discharge Follow-Up Phone Call   Triggered Alert: Discuss  Pt stated that she is doing well, "I feel a lot better". "I slept well, eaten, walked, I went to the bathroom already". She would like to know if she is able to shower.Educated that she can shower, do not soak R/L groin incision site in water for 7 days. Do not scrub the area. Pt relayed that it is bruised, no hematoma, active bleeding or pain in the area. Reviewed her diet at home should be a low-salt, low-fat and heart-healthy diet.  Pt is taking her Plavix 75 MG tablet and not on warfarin. Son still has to pick up the Plavix from the pharmacy because they could not fill it yesterday per her report. She handed the phone to his son. Per son, he received instructions from NP Debbie: Pt was told to continue Plavix until Sunday, with warfarin and ASA 81 mg PO daily. Son to pick-up her warfarin tonight and that the pt may continue NTG is as needed for chest pain and Stool softeners as needed. Lisabeth Pick H consulted.    Discussed AVS instruction to have an ECHO and ECG scheduled prior to that clinic visit. She stated that she would like to do it in her doctor's office at the [place] [facility] location. Educated the patient to go to the [place] [facility] out patient lab connected to [facility] before your clinic visit. You do not need an appointment for this blood work and you do not need to fast. She verblaized understanding.    Educated the patient how to check his dry weight daily and log it. Educated her to report a weight gain of 3 or more pounds in 3 days or less to his cardiologist. As well as swelling on legs, fever, bleeding, fatigue or decrease in ability to do usual daily activities. Pt verbalized understanding.  Reviewed 911/ED return precaution on the AVS. Pt verbalized understanding.         MJ [name], MSN, PCCN, RN-BC  [place]  Transitional Telephonic Nursing  T: [phone]  [email or web address]

## 2016-12-28 NOTE — Telephone Encounter (Signed)
I spoke to Cathy Lucas and confirm that instructions given to patient and her son. Patient to take aspirin, plavix, and warfarin. Dosing of warfarin as instructed on AVS - 5 mg po daily for 3 days then 2.5 mg po daily after that with INR on Monday. STOP plavix on Sunday    Juleen Starr, PharmD, BCPS  Transitions of Care Pharmacist  [phone]

## 2016-12-28 NOTE — Op Note (Signed)
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     PROCEDURE PERFORMED:  Mitral valve in valve replacement with a Sapien  326 mm bioprosthesis through the transseptal approach.     SURGEON/STAFF:  Lacretia Nicks, MD    ATTENDING CARDIOLOGIST:  Dr. Parke Poisson    ADDITIONAL INTERVENTIONAL CARDIOLOGIST:  Dr. Evelene Croon    CARDIOLOGY FELLOW:  Dr. 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  Patient  was found to be not a candidate for surgical treatment due to  frailty, and the decision was made to proceed with percutaneous  treatment.     FINDINGS:  Normal seating and function of the implanted mitral valve.    DESCRIPTION OF PROCEDURE:  On the date of the operation, patient was  brought to the operating room and positioned supine on the table.  After general anesthesia was induced, the patient was intubated.  The  peripheral IVs and arterial line were inserted.  Preoperative  antibiotics were given.  TEE probe was placed into the esophagus for  the intraoperative monitoring.  Subsequently, bilateral common  femoral venous access was obtained.  On the left side, the 6-French  sheath was inserted into the left common femoral vein and the  5-French temporary balloon-tipped pacing catheter was floated into  the right ventricle, with appropriate pacing thresholds confirmed by  test pacing the patient.  On the right, the 6-French catheter  was  inserted into the common femoral vein.  With the catheter in  position, the SL1 catheter was advanced into the right atrium under  the fluoroscopic guidance.  With the SL1 sheath in position, heparin  bolus was administered, and from that point on until the end of the  procedure, ACT was maintained at above 300 seconds.  Under the TEE  and fluoroscopic guidance, a transseptal puncture was performed with  the help of the SL1 catheter and the [facility] needle.  The septum was  crossed in the posterior inferior portion of the septum.  With the  septum crossed, the superstiff Amplatz wire was positioned in the  left upper pulmonary vein.  Subsequently, the SL1 catheter was  removed and exchanged for the 14-French delivery sheath.  With the  sheath in position, the JR4 catheter was advanced over the wire into  the left atrium, and subsequently the JR4 catheter was used to  exchange the Amplatz wire for the Bentson wire.  Bentson wire was  used to cross the aortic valve.  With the valve crossed, the JR4  catheter was advanced into the left ventricle.  The Bentson wire was  then exchanged for the Superstiff Amplatz wire.  With the wire in  position, the 12 mm x 40 mm mustang balloon was advanced across the  septum, and septal balloon plasty was performed at 2 atmospheres.  Subsequently, the balloon was advanced across the mitral valve, and  the balloon valvuloplasty was performed with the same balloon at 10  atmospheres.  The balloon was  then withdrawn.  The valve delivery  system was brought into the proximal IVC and the right atrium.  Final  assembly of the valve was undertaken and placed there.  The valve was  then advanced across the annulus of the mitral bioprosthesis and  deployed there under conditions of pacing at approximately 120 beats  per minute.  With the valve deployed, TEE interrogation demonstrated  only trace mitral regurgitation.  Therefore, the delivery system was  withdrawn.  TEE interrogation  demonstrated biventricular flow across  the new ISD.  The flow was minimal.     Therefore, all the equipment was withdrawn and the manual pressure  was held on the left groin, and the figure-of-eight suture was used  to close the access site on the right.  The patient was subsequently  transferred to ICU for continuation of care.  There were no  complications.  Dr. Almond Lint, Dr. Evelene Croon and Dr. Parke Poisson were scrubbed in  through the case.       Job #:  [id number]

## 2016-12-29 ENCOUNTER — Encounter (HOSPITAL_COMMUNITY): Payer: Self-pay | Admitting: Thoracic Surgery (Cardiothoracic Vascular Surgery)

## 2016-12-30 ENCOUNTER — Telehealth (HOSPITAL_COMMUNITY): Payer: Self-pay

## 2016-12-30 NOTE — Telephone Encounter (Addendum)
Spoke with patient to inform that patient's own medication buprenorphine 150 mcg SL patch x2 [facility] ([facility] 11/7).  Patient mentioned that she would check with her doctor to see if medication is still indicated, and if so, her son Marise Knapper may pick up medication with the upcoming weeks.  Plan for inpatient pharmacy to destroy medication if patient does not pick up by 02/19/17, plan relayed to patient who verbalized understanding.    Renee Pain, PharmD  PGY1 Acute Care Pharmacy Resident  Pager: [phone]

## 2017-01-01 DIAGNOSIS — I4581 Long QT syndrome: Secondary | ICD-10-CM

## 2017-01-01 DIAGNOSIS — I517 Cardiomegaly: Secondary | ICD-10-CM

## 2017-01-01 DIAGNOSIS — J984 Other disorders of lung: Secondary | ICD-10-CM

## 2017-01-01 LAB — ECG 12-LEAD
ATRIAL RATE: 61 {beats}/min
ATRIAL RATE: 81 {beats}/min
ECG INTERPRETATION: NORMAL
ECG INTERPRETATION: NORMAL
P AXIS: 75 degrees
P AXIS: 36 degrees
PR INTERVAL: 186 ms
PR INTERVAL: 142 ms
QRS INTERVAL/DURATION: 86 ms
QRS INTERVAL/DURATION: 80 ms
QT: 508 ms
QT: 400 ms
QTC INTERVAL: 511 ms
QTC INTERVAL: 464 ms
R AXIS: 101 degrees
R AXIS: 101 degrees
T AXIS: 79 degrees
T AXIS: 25 degrees
VENTRICULAR RATE: 61 {beats}/min
VENTRICULAR RATE: 81 {beats}/min

## 2017-01-02 ENCOUNTER — Telehealth (HOSPITAL_BASED_OUTPATIENT_CLINIC_OR_DEPARTMENT_OTHER): Payer: Self-pay | Admitting: Internal Medicine

## 2017-01-02 DIAGNOSIS — R9431 Abnormal electrocardiogram [ECG] [EKG]: Secondary | ICD-10-CM

## 2017-01-02 DIAGNOSIS — I498 Other specified cardiac arrhythmias: Secondary | ICD-10-CM

## 2017-01-02 DIAGNOSIS — R001 Bradycardia, unspecified: Secondary | ICD-10-CM

## 2017-01-02 LAB — ECG 12-LEAD
ATRIAL RATE: 80 {beats}/min
P AXIS: 30 degrees
PR INTERVAL: 148 ms
QRS INTERVAL/DURATION: 80 ms
QT: 394 ms
QTC INTERVAL: 454 ms
R AXIS: 107 degrees
T AXIS: 97 degrees
VENTRICULAR RATE: 80 {beats}/min

## 2017-01-02 NOTE — Telephone Encounter (Signed)
pls review chart for which class to offer pt

## 2017-01-03 ENCOUNTER — Encounter (INDEPENDENT_AMBULATORY_CARE_PROVIDER_SITE_OTHER): Payer: Medicaid Other | Admitting: Hepatology

## 2017-01-10 ENCOUNTER — Telehealth (HOSPITAL_COMMUNITY): Payer: Self-pay | Admitting: Nurse Practitioner

## 2017-01-10 NOTE — Telephone Encounter (Signed)
Called the patient. Received her INR results from [facility]. Her INR is 2.1. Will continue current dose of warfarin 2.5 MG nightly. Patient to follow up with Dr. Robina Ade, results faxed to his office.   Lavonia Drafts NP

## 2017-01-24 LAB — ACT POC, BLOOD
ACT (POCT): 136 s
ACT (POCT): 235 s
ACT (POCT): 315 s
ACT (POCT): 327 s
ACT (POCT): 355 s

## 2017-01-25 ENCOUNTER — Ambulatory Visit: Payer: Medicaid Other | Attending: Nurse Practitioner | Admitting: Interventional Cardiology

## 2017-01-25 ENCOUNTER — Encounter (HOSPITAL_COMMUNITY): Payer: Self-pay | Admitting: Interventional Cardiology

## 2017-01-25 ENCOUNTER — Ambulatory Visit
Admission: RE | Admit: 2017-01-25 | Discharge: 2017-01-25 | Disposition: A | Discharge status: Home / Self Care | Payer: Medicaid Other | Attending: Nurse Practitioner | Admitting: Nurse Practitioner

## 2017-01-25 ENCOUNTER — Ambulatory Visit (HOSPITAL_BASED_OUTPATIENT_CLINIC_OR_DEPARTMENT_OTHER): Admission: RE | Admit: 2017-01-25 | Discharge: 2017-01-25 | Payer: Medicaid Other

## 2017-01-25 VITALS — BP 146/90 | HR 83 | Resp 16 | Ht 63.0 in | Wt 173.0 lb

## 2017-01-25 DIAGNOSIS — I342 Nonrheumatic mitral (valve) stenosis: Secondary | ICD-10-CM | POA: Insufficient documentation

## 2017-01-25 DIAGNOSIS — I517 Cardiomegaly: Secondary | ICD-10-CM

## 2017-01-25 DIAGNOSIS — G894 Chronic pain syndrome: Secondary | ICD-10-CM

## 2017-01-25 DIAGNOSIS — Z953 Presence of xenogenic heart valve: Secondary | ICD-10-CM | POA: Insufficient documentation

## 2017-01-25 DIAGNOSIS — Z952 Presence of prosthetic heart valve: Secondary | ICD-10-CM | POA: Insufficient documentation

## 2017-01-25 DIAGNOSIS — I10 Essential (primary) hypertension: Secondary | ICD-10-CM | POA: Insufficient documentation

## 2017-01-25 NOTE — Progress Notes (Signed)
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 She is now S/P Successful placement of a 26 mm Sapien 3 valve  In the mitral position via the R femoral venous/transseptal approach 12/26/16.   Patient is here for her 30 day follow up and she is feeling well. She states she is able to do whatever she wants, but nervous to do to much. She denies SOB at rest, DOE is minimal now. She feels less anxious and has more energy.   She was put on warfarin post TAVR and Plavix was placed on hold during the time she is on warfarin. Dr. Robina Ade is monitoring and managing the warfarin         Patient Active Problem List    Diagnosis Date Noted    S/P mitral valve repair 12/27/2016    Mitral stenosis 12/26/2016    Pulmonary HTN (CMS-HCC) 11/16/2016     Added automatically from request for surgery [id number]      NASH (nonalcoholic steatohepatitis) 08/30/2016    Liver cyst 08/30/2016    BMI 27.0-27.9,adult 08/30/2016    Abdominal pain 10/10/2014    Liver mass 10/10/2014     11/25/15 MRI Abd W/WO Contrast ([facility]): Stable appearence of a cystic lesion in the right lobe of the liver near the hepatic dome measuring 3.9 x 6.1 6.5 cm. 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 However, a hemorrhagic cyst remains in the differential . No aggressive/malignant features are noted. However, continued follow-up is recommended to assess stability.  Stable appearence of several additional small simple cysts within the liver.   Previous cholecystectomy.    11/24/14 MRI Abd  W/WO Contrast ([facility]): A well-circumscribed T1 hyperintense cystic lesion in the right hepatic lobe measures 7.6 x 7.7 cm (20-10) with nonenhancing nodularity seen inferiorly and with a single internal septation. This lesion is similar in size and appearance to the MRI examination perfolrmed on 11/09/2011. Similar smaller cystic lesions are seen at the periphery of hepatic segments six and seven. All of these lesions are unchanged from prior.     10/12/11 [place] Abd ([facility]): Hepatic steatosis. Septated right hepatic cyst. Hypervascular possibly solid lesion in the right lobe. Depending on clinical considerations, further evaluation with pre-and post contrast enhanced MR and CT may be of benefit for further evaluation of these lesions. Cholecystectomy.      06/09/2015 Liver Bx [facility]): Steatohepatitis with periportal and focal septal fibrosis(stage 2-3 of 4). Small focus of benign epithelial lining suggestive of benign cyst, with adjacent chronic inflammation, reactive fibrosis, and hemosiderin depostition B. Liver asparte fluid: hypocellular specimen. Rare benign hepatocysts and macrophages. No malignant cells identified.      Hypertension 10/10/2014    Hypothyroidism 10/10/2014    Fibromyalgia 10/08/2009       Past Medical History:   Diagnosis Date    Back pain     Needs L4 fusion    Depression     Diabetes (CMS-HCC)     Fibromyalgia     GERD (gastroesophageal reflux disease)     Glaucoma     Hypercholesterolemia  Hypertension     Hypothyroidism     Migraines     PAF (paroxysmal atrial fibrillation) (CMS-HCC)        Past Surgical History:   Procedure Laterality Date    BILE DUCT STENT PLACEMENT      CHOLECYSTECTOMY      COLONOSCOPY  05/31/2012    normal mucosa with internal hemorrhoids.     ESOPHAGOGASTRODUODENOSCOPY  03/11/2015    HYSTERECTOMY      LIVER BIOPSY  05/22/2015    steatohepatitis with periportal and focal septal fibrosis(stage 2-3 of 4). Small focus of benign  epithelial lining suggestive of benign cyst, with adjacent chronic inflammation, reactive fibrosis, and hemosiderin depostition B. Liver asparte fluid: hypocellular specimen. Rare benign hepatocysts and macrophages. No malignant cells identified.    MITRAL VALVE REPAIR  12/26/2016    TMVR    TONSILLECTOMY AND ADENOIDECTOMY           Current Outpatient Medications   Medication Sig Dispense Refill    aspirin 81 MG tablet Take 81 mg by mouth daily.      atorvastatin (LIPITOR) 10 MG tablet Take 10 mg by mouth daily.      Buprenorphine HCl (BELBUCA) 150 MCG FILM Suck 150 mcg in mouth 2 times daily.      carvedilol (COREG) 25 MG tablet Take 25 mg by mouth 2 times daily (with meals).      Cholecalciferol (VITAMIN D PO) Take 50,000 capsules by mouth once a week.        diphenhydrAMINE (BENADRYL) 25 MG capsule Take 50 mg by mouth every 6 hours as needed for Itching.        furosemide (LASIX) 20 MG tablet Take 20 mg by mouth as needed.      HYDROmorphone (DILAUDID) 2 MG tablet Take 2 mg by mouth every 4 hours as needed for Moderate Pain (Pain Score 4-6).      latanoprost (XALATAN) 0.005 % ophthalmic solution Place 1 drop into both eyes every evening.      levothyroxine (SYNTHROID) 100 MCG tablet Take 112 mcg by mouth daily.        ondansetron (ZOFRAN) 8 MG tablet Take 8 mg by mouth every 8 hours as needed for Nausea/Vomiting.      pantoprazole (PROTONIX) 40 MG tablet Take 40 mg by mouth daily.      warfarin (COUMADIN) 5 MG tablet Take 1 tablet (5 mg) by mouth nightly. Take 5 mg nightly for 3 days and then 1/2 tablet nightly 90 tablet 3    zolpidem (AMBIEN) 5 MG tablet Take 5 mg by mouth nightly as needed for Insomnia.       No current facility-administered medications for this visit.          Allergies   Allergen Reactions    Ceftriaxone Rash    Iodine Rash    Iv Contrast [Contrast Media] Anaphylaxis     Itching swelling unable to breathe      Latex Rash    Metformin Rash    Ativan [Lorazepam] Other      Heart palpitations      Januvia [Sitagliptin] Other     Sob, chest pain, heart palpitations      Macrobid [Nitrofurantoin] Unspecified    Tylenol [Acetaminophen] Other     bc of the lesion of the liver.         Family History   Problem Relation Name Age of Onset    Stroke Mother      Heart Disease  Mother      Stroke Father      Heart Disease Father         Social History     Socioeconomic History    Marital status: Married     Spouse name: Not on file    Number of children: Not on file    Years of education: Not on file    Highest education level: Not on file   Social Needs    Financial resource strain: Not on file    Food insecurity - worry: Not on file    Food insecurity - inability: Not on file    Transportation needs - medical: Not on file    Transportation needs - non-medical: Not on file   Occupational History    Not on file   Tobacco Use    Smoking status: Never Smoker    Smokeless tobacco: Never Used   Substance and Sexual Activity    Alcohol use: No    Drug use: No    Sexual activity: Not on file   Other Topics Concern    Caffeine Concern Not Asked   Social History Narrative    Not on file       Review Of Systems  As follows plus as noted in HPI/PMH  Constitutional: negative.  CV: negative for:  palpitations, syncope, chest pain, paroxysmal nocturnal dyspnea, orthopnea, lower extremity edema, cyanosis, and claudication.     Physical Exam:   Vitals: BP 146/90 (BP Location: Left arm, BP Patient Position: Sitting, BP cuff size: Regular)    Pulse 83    Resp 16    Ht _0  (1.6 m)    Wt 78.5 kg (173 lb)    SpO2 95%    BMI 30.65 kg/m     General Description: Alert and oriented. Affect normal. Cooperative  Chest and Lungs:  Lungs clear and full to bilateral bases.  Heart: Regular rate and rhythm. No M/C/G  Abdomen: BS+, soft, non-distended, non-tender.  Integument: No active lesion.  Neck/Thyroid: No JVD, No carotid Bruits  Peripheral Vascular: Radial pulses 2+ bilateral / Pedal pulses 2+  bilateral No edema     Recent Labs:  Lab Results   Component Value Date    NA 139 12/27/2016    K 4.1 12/27/2016    CL 106 12/27/2016    BICARB 23 12/27/2016    BUN 11 12/27/2016    CREAT 0.71 12/27/2016    GLU 163 (H) 12/27/2016    [place] 8.6 12/27/2016    AST 19 12/26/2016    ALT <5 12/26/2016    ALK 79 12/26/2016    TBILI 0.63 12/26/2016    ALB 3.5 12/26/2016    TP 5.5 (L) 12/26/2016       Lab Results   Component Value Date    WBC 7.9 12/27/2016    RBC 3.87 (L) 12/27/2016    HGB 10.6 (L) 12/27/2016    HCT 33.0 (L) 12/27/2016    MCV 85.3 12/27/2016    MCHC 32.1 12/27/2016    RDW 15.7 (H) 12/27/2016    PLT 177 12/27/2016    MPV 10.3 12/27/2016    LYMPHS 9 12/27/2016    MONOS 11 12/27/2016    EOS 3 12/27/2016    BASOS 1 12/27/2016       Lab Results   Component Value Date    PT 14.8 (H) 12/27/2016    INR 1.4 12/27/2016    PTT 28.3 10/10/2014  Cardiology studies:  EKG:   12/27/16 NSR, rate 81 Right axis deviation     Echocardiography  01/25/17   Pending     12/27/16 Post TMVR  Mitral MG 7.0, EF 68%, PAP 68  Summary:  1. Bioprosthetic mitral valve with a mean gradient of 7 mmHg.  2. The left ventricular size is normal and the left ventricular systolic function is normal.  3. The right ventricular size is normal and systolic function is mildly reduced.  4. Mild tricuspid regurgitation.  5. Severe pulmonary hypertension with right ventricular systolic pressure measuring 68 mmHg.  6. Compared to prior study s/p MVR with improved mean gradient, was ~ 20 mmHg and now 7 mmHg. Estimated RVSP is lower.    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 She is now S/P Successful placement of a 26 mm Sapien 3 valve  In the mitral position via the R femoral venous/transseptal approach. She is here for her 30 day follow up post TAVR and is feeling well.       1. Severe Mitral Stenosis:  S/P Successful placement of a 26 mm Sapien 3 valve  In the mitral position via the R femoral venous/transseptal approach 12/26/16.   --Follow up with Dr. Robina Ade   --Will need ECHO in one year post valve placement ~ 12/2017, this can be done at [facility] or with Dr. Robina Ade per patient choice  --Patient to continue on warfarin for 6 months post TMVR (~06/25/17), when she stops her warfarin, she needs to restart her plavix. + ASA 81 mg daily   --Advised to continue monitoring BP at home and to follow up with Dr. Robina Ade if it remains elevated   --Continue current medical management   --To follow up with pain management MD to discuss weaning off pain medication  --To follow up with endocrinology to continue evaluation of hypocglycemia.     The patient is seen in collaboration with Dr. Parke Poisson, who entered the room, confirmed pertinent parts of the history and physical examination and participated in the clinical decision making and formulation of plan as recorded by me.      Lavonia Drafts, NP   Interventional Cardiology  [phone]

## 2017-01-28 ENCOUNTER — Encounter (HOSPITAL_COMMUNITY): Payer: Self-pay | Admitting: Interventional Cardiology

## 2017-01-30 DIAGNOSIS — I342 Nonrheumatic mitral (valve) stenosis: Secondary | ICD-10-CM

## 2017-01-30 DIAGNOSIS — Z952 Presence of prosthetic heart valve: Secondary | ICD-10-CM

## 2017-01-30 LAB — ECG 12-LEAD
ATRIAL RATE: 80 {beats}/min
ECG INTERPRETATION: NORMAL
P AXIS: 46 degrees
PR INTERVAL: 142 ms
QRS INTERVAL/DURATION: 86 ms
QT: 424 ms
QTC INTERVAL: 489 ms
R AXIS: 96 degrees
T AXIS: 86 degrees
VENTRICULAR RATE: 80 {beats}/min

## 2017-01-30 LAB — 2D ECHO WITH IMAGE ENHANCEMENT AGENT IF NECESSARY
IVC Diameter: 0.96 cm
LA Volume Index: 22.9 ml/m²
LV Ejection Fraction: 70 %
Mitral Mean Gradient: 7 mmHg

## 2017-01-31 NOTE — Telephone Encounter (Signed)
Mitral valve replacements 12/26/16

## 2017-01-31 NOTE — Telephone Encounter (Signed)
Called and left message about cardiac rehabilitation.

## 2017-02-09 ENCOUNTER — Ambulatory Visit (HOSPITAL_BASED_OUTPATIENT_CLINIC_OR_DEPARTMENT_OTHER): Payer: Medicaid Other

## 2017-03-01 NOTE — Telephone Encounter (Signed)
Called patient and left message about cardiac rehabilitation.

## 2017-03-21 ENCOUNTER — Ambulatory Visit (HOSPITAL_BASED_OUTPATIENT_CLINIC_OR_DEPARTMENT_OTHER): Payer: Medicaid Other

## 2017-03-30 ENCOUNTER — Telehealth (HOSPITAL_BASED_OUTPATIENT_CLINIC_OR_DEPARTMENT_OTHER): Payer: Self-pay | Admitting: Hepatology

## 2017-03-30 DIAGNOSIS — K7581 Nonalcoholic steatohepatitis (NASH): Secondary | ICD-10-CM

## 2017-03-30 NOTE — Telephone Encounter (Signed)
Pts son [name] calling to request lab orders to be placed so that pt can have them done prior to MRI that is scheduled for 04/23/17.    Pt comes to  for labs.    Routing to RN for assistance.

## 2017-03-30 NOTE — Telephone Encounter (Addendum)
Appt Confirmation: Yes, with patient and brother [name].    Chart reviewed for upcoming visit.  Last visit: 08/30/16 Dr/ Vodkin  Next visit: 08/29/17 Dr. Marykay Lex  Imaging: 05/03/17 MRI scheduled  Labs: 08/30/16, orders pended.   Misc: [facility] 2017 inpt records see media    Routing to [place], patient would like to have labs done at [facility].

## 2017-04-06 ENCOUNTER — Encounter (HOSPITAL_BASED_OUTPATIENT_CLINIC_OR_DEPARTMENT_OTHER): Payer: Self-pay | Admitting: Interventional Cardiology

## 2017-04-06 NOTE — Addendum Note (Signed)
Encounter addended by: Wendee Beavers on: 04/06/2017 12:24 PM   Actions taken: Charge Capture section accepted

## 2017-04-10 ENCOUNTER — Ambulatory Visit (HOSPITAL_BASED_OUTPATIENT_CLINIC_OR_DEPARTMENT_OTHER): Payer: Medicaid Other

## 2017-04-16 ENCOUNTER — Ambulatory Visit (HOSPITAL_BASED_OUTPATIENT_CLINIC_OR_DEPARTMENT_OTHER): Payer: Medicaid Other

## 2017-04-18 ENCOUNTER — Encounter (INDEPENDENT_AMBULATORY_CARE_PROVIDER_SITE_OTHER): Payer: Medicaid Other | Admitting: Hepatology

## 2017-04-23 ENCOUNTER — Ambulatory Visit (HOSPITAL_BASED_OUTPATIENT_CLINIC_OR_DEPARTMENT_OTHER): Admit: 2017-04-23 | Payer: Medicaid Other

## 2017-05-15 ENCOUNTER — Telehealth (HOSPITAL_BASED_OUTPATIENT_CLINIC_OR_DEPARTMENT_OTHER): Payer: Self-pay | Admitting: Hepatology

## 2017-05-15 ENCOUNTER — Ambulatory Visit
Admit: 2017-05-15 | Discharge: 2017-05-15 | Disposition: A | Discharge status: Home / Self Care | Payer: Medicaid Other | Attending: Hepatology | Admitting: Hepatology

## 2017-05-15 DIAGNOSIS — R1011 Right upper quadrant pain: Secondary | ICD-10-CM

## 2017-05-15 DIAGNOSIS — K7581 Nonalcoholic steatohepatitis (NASH): Secondary | ICD-10-CM

## 2017-05-15 DIAGNOSIS — R160 Hepatomegaly, not elsewhere classified: Secondary | ICD-10-CM

## 2017-05-15 NOTE — Telephone Encounter (Signed)
Pt notified MRE order placed.

## 2017-05-15 NOTE — Telephone Encounter (Signed)
Please try one more attempt to call this patient. Thank you!

## 2017-05-15 NOTE — Telephone Encounter (Signed)
Pt calling to request MRE order to be placed.     Pt will have it done .     Pt would like a courtesy call for awareness of order.  Ph. [phone]

## 2017-05-15 NOTE — Telephone Encounter (Signed)
MRI scheduled on 06/09/17.   MRE order pended.

## 2017-05-16 NOTE — Telephone Encounter (Signed)
3rd attempt  Lm to cb and let us know if interested in cardiac rehab. Closing referral and encounter.

## 2017-06-09 ENCOUNTER — Ambulatory Visit
Admission: RE | Admit: 2017-06-09 | Discharge: 2017-06-09 | Disposition: A | Discharge status: Home / Self Care | Payer: Medicaid Other | Attending: Hepatology | Admitting: Hepatology

## 2017-06-09 ENCOUNTER — Ambulatory Visit
Admit: 2017-06-09 | Discharge: 2017-06-09 | Disposition: A | Discharge status: Home / Self Care | Payer: Medicaid Other | Attending: Hepatology | Admitting: Hepatology

## 2017-06-09 DIAGNOSIS — R1011 Right upper quadrant pain: Secondary | ICD-10-CM | POA: Insufficient documentation

## 2017-06-09 DIAGNOSIS — K769 Liver disease, unspecified: Secondary | ICD-10-CM

## 2017-06-09 DIAGNOSIS — K7581 Nonalcoholic steatohepatitis (NASH): Secondary | ICD-10-CM

## 2017-06-09 DIAGNOSIS — K760 Fatty (change of) liver, not elsewhere classified: Secondary | ICD-10-CM | POA: Insufficient documentation

## 2017-06-09 DIAGNOSIS — K7689 Other specified diseases of liver: Secondary | ICD-10-CM | POA: Insufficient documentation

## 2017-06-09 DIAGNOSIS — R160 Hepatomegaly, not elsewhere classified: Secondary | ICD-10-CM | POA: Insufficient documentation

## 2017-06-09 MED ORDER — GADOBUTROL 1 MMOL/ML IV SOLN
7.0000 mL | Freq: Once | INTRAVENOUS | Status: AC
Start: 2017-06-09 — End: 2017-06-09
  Administered 2017-06-09: 7 mL via INTRAVENOUS
  Filled 2017-06-09: qty 7

## 2017-06-12 ENCOUNTER — Other Ambulatory Visit: Payer: Self-pay | Admitting: Hepatology

## 2017-06-12 DIAGNOSIS — R1011 Right upper quadrant pain: Secondary | ICD-10-CM

## 2017-06-12 DIAGNOSIS — R160 Hepatomegaly, not elsewhere classified: Secondary | ICD-10-CM

## 2017-06-14 ENCOUNTER — Ambulatory Visit: Payer: Medicaid Other | Attending: Nurse Practitioner | Admitting: Interventional Cardiology

## 2017-06-14 ENCOUNTER — Encounter (HOSPITAL_COMMUNITY): Payer: Self-pay | Admitting: Interventional Cardiology

## 2017-06-14 VITALS — BP 118/77 | HR 79 | Temp 97.7°F | Resp 16 | Ht 63.0 in | Wt 177.0 lb

## 2017-06-14 DIAGNOSIS — I272 Pulmonary hypertension, unspecified: Secondary | ICD-10-CM | POA: Insufficient documentation

## 2017-06-14 DIAGNOSIS — Z9889 Other specified postprocedural states: Secondary | ICD-10-CM

## 2017-06-14 DIAGNOSIS — I050 Rheumatic mitral stenosis: Secondary | ICD-10-CM | POA: Insufficient documentation

## 2017-06-14 NOTE — Progress Notes (Signed)
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 She is now S/P Successful placement of a 26 mm Sapien 3 valve in the mitral position via the R femoral venous/transseptal approach 12/26/16. She recovered tremendously well and since her post TMVR follow up 01/25/17, she has been continuing to do well with increase energy and stamina, she remains with occasional mild DOE. No chest pain            Patient Active Problem List    Diagnosis Date Noted    S/P mitral valve repair 12/27/2016    Mitral stenosis 12/26/2016    Pulmonary HTN (CMS-HCC) 11/16/2016     Added automatically from request for surgery [id number]      NASH (nonalcoholic steatohepatitis) 08/30/2016    Liver cyst 08/30/2016    BMI 27.0-27.9,adult 08/30/2016    Abdominal pain 10/10/2014    Liver mass 10/10/2014     11/25/15 MRI Abd W/WO Contrast ([facility]): Stable appearence of a cystic lesion in the right lobe of the liver near the hepatic dome measuring 3.9 x 6.1 6.5 cm. 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 However, a hemorrhagic cyst remains in the differential . No aggressive/malignant features are noted. However, continued follow-up is recommended to assess stability.  Stable appearence of several additional small simple cysts within the liver.   Previous cholecystectomy.    11/24/14 MRI Abd W/WO Contrast ([facility]): A well-circumscribed T1 hyperintense cystic lesion in the right hepatic lobe measures 7.6 x 7.7 cm (20-10) with nonenhancing nodularity seen  inferiorly and with a single internal septation. This lesion is similar in size and appearance to the MRI examination perfolrmed on 11/09/2011. Similar smaller cystic lesions are seen at the periphery of hepatic segments six and seven. All of these lesions are unchanged from prior.     10/12/11 [place] Abd ([facility]): Hepatic steatosis. Septated right hepatic cyst. Hypervascular possibly solid lesion in the right lobe. Depending on clinical considerations, further evaluation with pre-and post contrast enhanced MR and CT may be of benefit for further evaluation of these lesions. Cholecystectomy.      06/09/2015 Liver Bx [facility]): Steatohepatitis with periportal and focal septal fibrosis(stage 2-3 of 4). Small focus of benign epithelial lining suggestive of benign cyst, with adjacent chronic inflammation, reactive fibrosis, and hemosiderin depostition B. Liver asparte fluid: hypocellular specimen. Rare benign hepatocysts and macrophages. No malignant cells identified.      Hypertension 10/10/2014    Hypothyroidism 10/10/2014    Fibromyalgia 10/08/2009       Past Medical History:   Diagnosis Date    Back pain     Needs L4 fusion    Depression     Diabetes (CMS-HCC)     Fibromyalgia     GERD (gastroesophageal reflux disease)     Glaucoma     Hypercholesterolemia     Hypertension     Hypothyroidism     Migraines     PAF (paroxysmal atrial fibrillation) (CMS-HCC)        Past Surgical History:   Procedure Laterality Date    BILE DUCT STENT PLACEMENT  CHOLECYSTECTOMY      COLONOSCOPY  05/31/2012    normal mucosa with internal hemorrhoids.     ESOPHAGOGASTRODUODENOSCOPY  03/11/2015    HYSTERECTOMY      LIVER BIOPSY  05/22/2015    steatohepatitis with periportal and focal septal fibrosis(stage 2-3 of 4). Small focus of benign epithelial lining suggestive of benign cyst, with adjacent chronic inflammation, reactive fibrosis, and hemosiderin depostition B. Liver asparte fluid:  hypocellular specimen. Rare benign hepatocysts and macrophages. No malignant cells identified.    MITRAL VALVE REPAIR  12/26/2016    TMVR    TONSILLECTOMY AND ADENOIDECTOMY           Current Outpatient Medications   Medication Sig Dispense Refill    aspirin 81 MG tablet Take 81 mg by mouth daily.      atorvastatin (LIPITOR) 10 MG tablet Take 10 mg by mouth daily.      Buprenorphine HCl (BELBUCA) 150 MCG FILM Suck 150 mcg in mouth 2 times daily.      carvedilol (COREG) 25 MG tablet Take 25 mg by mouth 2 times daily (with meals).      Cholecalciferol (VITAMIN D PO) Take 50,000 capsules by mouth once a week.        diphenhydrAMINE (BENADRYL) 25 MG capsule Take 50 mg by mouth every 6 hours as needed for Itching.        furosemide (LASIX) 20 MG tablet Take 20 mg by mouth as needed.      HYDROmorphone (DILAUDID) 2 MG tablet Take 2 mg by mouth every 4 hours as needed for Moderate Pain (Pain Score 4-6).      latanoprost (XALATAN) 0.005 % ophthalmic solution Place 1 drop into both eyes every evening.      levothyroxine (SYNTHROID) 100 MCG tablet Take 112 mcg by mouth daily.        ondansetron (ZOFRAN) 8 MG tablet Take 8 mg by mouth every 8 hours as needed for Nausea/Vomiting.      pantoprazole (PROTONIX) 40 MG tablet Take 40 mg by mouth daily.      warfarin (COUMADIN) 5 MG tablet Take 1 tablet (5 mg) by mouth nightly. Take 5 mg nightly for 3 days and then 1/2 tablet nightly 90 tablet 3    zolpidem (AMBIEN) 5 MG tablet Take 5 mg by mouth nightly as needed for Insomnia.       No current facility-administered medications for this visit.          Allergies   Allergen Reactions    Ceftriaxone Rash    Iodine Rash    Iv Contrast [Contrast Media] Anaphylaxis     Itching swelling unable to breathe      Latex Rash    Metformin Rash    Ativan [Lorazepam] Other     Heart palpitations      Januvia [Sitagliptin] Other     Sob, chest pain, heart palpitations      Macrobid [Nitrofurantoin] Unspecified    Tylenol  [Acetaminophen] Other     bc of the lesion of the liver.         Family History   Problem Relation Name Age of Onset    Stroke Mother      Heart Disease Mother      Stroke Father      Heart Disease Father         Social History     Socioeconomic History    Marital status: Married     Spouse name: Not on file  Number of children: Not on file    Years of education: Not on file    Highest education level: Not on file   Occupational History    Not on file   Social Needs    Financial resource strain: Not on file    Food insecurity:     Worry: Not on file     Inability: Not on file    Transportation needs:     Medical: Not on file     Non-medical: Not on file   Tobacco Use    Smoking status: Never Smoker    Smokeless tobacco: Never Used   Substance and Sexual Activity    Alcohol use: No    Drug use: No    Sexual activity: Not on file   Lifestyle    Physical activity:     Days per week: Not on file     Minutes per session: Not on file    Stress: Not on file   Relationships    Social connections:     Talks on phone: Not on file     Gets together: Not on file     Attends religious service: Not on file     Active member of club or organization: Not on file     Attends meetings of clubs or organizations: Not on file     Relationship status: Not on file    Intimate partner violence:     Fear of current or ex partner: Not on file     Emotionally abused: Not on file     Physically abused: Not on file     Forced sexual activity: Not on file   Other Topics Concern    Caffeine Concern Not Asked   Social History Narrative    Not on file       Review Of Systems  As follows plus as noted in HPI/PMH  Constitutional: negative.  CV: negative for:  palpitations, syncope, chest pain, paroxysmal nocturnal dyspnea, orthopnea, lower extremity edema, cyanosis, and claudication.     Physical Exam:   Vitals: BP 118/77 (BP Location: Left arm, BP Patient Position: Sitting, BP cuff size: Regular)    Pulse 79    Temp 97.7 F  (36.5 C) (Oral)    Resp 16    Ht 5' 3"  (1.6 m)    Wt 80.3 kg (177 lb)    SpO2 98%    BMI 31.35 kg/m     General Description: Alert and oriented. Affect normal. Cooperative  Chest and Lungs:  Lungs clear and full to bilateral bases.  Heart: Regular rate and rhythm. No rub or gallop. No audible murmur   Abdomen: BS+, soft, non-distended, non-tender.  Integument: No active lesion.  Neck/Thyroid: No JVD, No carotid Bruits  Peripheral Vascular: Radial pulses 2+ bilateral / Pedal pulses 2+ bilateral no edema     Recent Labs:  Lab Results   Component Value Date    NA 139 12/27/2016    K 4.1 12/27/2016    CL 106 12/27/2016    BICARB 23 12/27/2016    BUN 11 12/27/2016    CREAT 0.71 12/27/2016    GLU 163 (H) 12/27/2016    [place] 8.6 12/27/2016    AST 19 12/26/2016    ALT <5 12/26/2016    ALK 79 12/26/2016    TBILI 0.63 12/26/2016    ALB 3.5 12/26/2016    TP 5.5 (L) 12/26/2016       Lab Results   Component  Value Date    WBC 7.9 12/27/2016    RBC 3.87 (L) 12/27/2016    HGB 10.6 (L) 12/27/2016    HCT 33.0 (L) 12/27/2016    MCV 85.3 12/27/2016    MCHC 32.1 12/27/2016    RDW 15.7 (H) 12/27/2016    PLT 177 12/27/2016    MPV 10.3 12/27/2016    LYMPHS 9 12/27/2016    MONOS 11 12/27/2016    EOS 3 12/27/2016    BASOS 1 12/27/2016       Lab Results   Component Value Date    PT 14.8 (H) 12/27/2016    INR 1.4 12/27/2016    PTT 28.3 10/10/2014         Cardiology studies:  EKG: 01/25/17 NSR, Rate 80    ECHO 01/25/17 Follow up TMVR   MMG 7.0, , MV 2.06, EF 70%  Summary:  1. The left ventricular size is normal and the left ventricular systolic function is normal.  2. The right ventricular size is mildly enlarged and systolic function is reduced.  3. S/p 26 mm Sapien 3 valve in the mitral position with mean gradient of 7 mm Hg.  4. Cannot estimate RVSP due to trace TR jet.  5. Compared to prior study 12/27/16 no change in mitral valve graidnet.    12/27/16 Post TMVR  Mitral MG 7.0, MV 2.15, EF 68%, PAP 68  Summary:  1. Bioprosthetic mitral  valve with a mean gradient of 7 mmHg.  2. The left ventricular size is normal and the left ventricular systolic function is normal.  3. The right ventricular size is normal and systolic function is mildly reduced.  4. Mild tricuspid regurgitation.  5. Severe pulmonary hypertension with right ventricular systolic pressure measuring 68 mmHg.  6. Compared to prior study s/p MVR with improved mean gradient, was ~ 20 mmHg and now 7 mmHg. Estimated RVSP is lower.      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 She is now S/P Successful placement of a 26 mm Sapien 3 valve In the mitral position via the R femoral venous/transseptal 12/26/16    1. Severe Mitral Regurgitation : S/P placement of a transcatheter mitral valve replacement with a Sapien3  26 mm bioprosthetic aortic valve.   --Follow up with Dr.Kabra   --Advised to continue warfarin due to the valve being a valve in valve in the mitral position.   --She is ok to stop the warfarin for procedures, she was cleared to stop for her teeth work she needs done  --See will need antibiotics before any dental work, it is up to the dentists discretion and Dr. Julaine Hua, if they want IV antibiotics, this is not required from our stand point.  --She is OK to start her DM medication as needed  --Continue to follow up with pain management  --will need ECHO ~ 12/2017 for her 1year post TAVR, this can be done here at [place] or with Dr. Julaine Hua, it is the patient's choice.   --Referred back to pulmonary HTN clinic    The patient is seen in collaboration with Dr. Parke Poisson, who entered  the room, confirmed pertinent parts of the history and physical examination and  participated in the clinical decision making and formulation of plan as recorded by me.    Lavonia Drafts, NP   Interventional Cardiology  [phone]

## 2017-06-15 ENCOUNTER — Ambulatory Visit (HOSPITAL_BASED_OUTPATIENT_CLINIC_OR_DEPARTMENT_OTHER): Payer: Medicaid Other

## 2017-07-03 ENCOUNTER — Telehealth (HOSPITAL_COMMUNITY): Payer: Self-pay | Admitting: Nurse Practitioner

## 2017-07-03 NOTE — Telephone Encounter (Signed)
Patient called and stated that for 2-3 days that she was experience Palpitations with some SOB and off and On chest pressure. Spoke with her today and symptoms had resolved and she is feeling better. Stated that she was almost in a car accident the day things happened and it really shook her up. Discussed her history of panic attacks and anxiety as possible cause. Possible low K+ level, as she restarted her vitamins just before symptoms resolved.   Advised to see Dr. Julaine Hua if symptoms resume, may need Holter monitor, ECG, BMP or ECHO. She agreed.   Lavonia Drafts NP.

## 2017-07-04 LAB — COMPREHENSIVE METABOLIC PANEL, BLOOD
ALT (SGPT): 19
AST (SGOT): 21
Albumin/Globulin Ratio: 1.8
Albumin: 4.6
Alkaline Phos: 116
Antinuclear Antibodies, IFA: NEGATIVE
BUN: 11
Bicarbonate: 24
Bilirubin, Total: 0.5
C Reactive Protein: 2.3
CCP Antibodies IgG/IgA: >250
Calcium: 9.6
Chloride: 100
Creatinine: 0.73
Globulin: 2.6
Glucose: 162
Potassium: 3.9
Sodium: 138
Total Protein: 7.2
eGFR African American: 101
eGFR non-Afr.American: 87

## 2017-07-04 LAB — CBC WITH DIFF, BLOOD
Basophils: 1
Eosinophils: 3
Hct: 40.8
Hgb: 13.9
Lymphocytes: 20
MCH: 30.3
MCHC: 34.1
MCV: 89
Monocytes: 10
Plt Count: 263
RBC: 4.58
RDW: 14.2
Segs: 66
WBC: 7.3

## 2017-07-04 LAB — LIPID(CHOL FRACT) PANEL, BLOOD
Cholesterol: 149
HDL Cholesterol: 46
LDL Cholesterol: 70
Triglycerides: 167

## 2017-08-01 ENCOUNTER — Telehealth (HOSPITAL_COMMUNITY): Payer: Self-pay

## 2017-08-01 NOTE — Telephone Encounter (Signed)
Rec'd call from pt and her son Cathy Lucas.  Pt was seen by her Rheumatologist recently and has concerns re: medications. They report that they were told prior to her valve surgery that she needed to be off her meds for RA. She has not been taking them since then; approx 6 months.  She currently has a RA flare and went back to see her Rheumatologist.  He has advised her to retsart her meds: Prednisone 5 mg BID, hydrochloroquine 223m BID, leflunomide 20 mg. They are very concerned about restarting these meds as they were told the meds could contribute to the degeneragion of her valve, and could cause the new valve to fail.  They are looking for advice from Dr. MParke Poissonabout the risks of restarting the meds. She is planning to hold the meds until she hears back.  Routing to Structural Heart providers for input

## 2017-08-20 ENCOUNTER — Encounter (HOSPITAL_COMMUNITY): Payer: Medicaid Other | Admitting: Pulmonary Medicine

## 2017-08-21 LAB — HEMOGLOBIN A1C - EXTERNAL
Estimated Mean Glucose: 143 mg/dL — NL
Hemoglobin A1C: 6.6 % — ABNORMAL HIGH (ref <5.7)

## 2017-08-21 MED ORDER — CARVEDILOL 12.5 MG OR TABS
25.00 mg | ORAL_TABLET | ORAL | Status: DC
Start: ? — End: 2017-08-21

## 2017-08-21 MED ORDER — LEVOTHYROXINE SODIUM 88 MCG OR TABS
88.00 ug | ORAL_TABLET | ORAL | Status: DC
Start: ? — End: 2017-08-21

## 2017-08-21 MED ORDER — BUPRENORPHINE HCL 150 MCG BU FILM
1.00 | ORAL_FILM | BUCCAL | Status: DC
Start: ? — End: 2017-08-21

## 2017-08-21 MED ORDER — CLOPIDOGREL BISULFATE 75 MG OR TABS
75.00 mg | ORAL_TABLET | ORAL | Status: DC
Start: ? — End: 2017-08-21

## 2017-08-21 MED ORDER — GENERIC EXTERNAL MEDICATION
1.00 [drp] | Status: DC
Start: ? — End: 2017-08-21

## 2017-08-21 MED ORDER — ASPIRIN 81 MG OR CHEW
324.00 mg | CHEWABLE_TABLET | ORAL | Status: DC
Start: ? — End: 2017-08-21

## 2017-08-21 MED ORDER — DEXTROSE-NACL 5-0.45 % IV SOLN
75.00 mL/h | INTRAVENOUS | Status: DC
Start: ? — End: 2017-08-21

## 2017-08-21 MED ORDER — ENOXAPARIN SODIUM 40 MG/0.4ML SC SOLN
40.00 mg | SUBCUTANEOUS | Status: DC
Start: ? — End: 2017-08-21

## 2017-08-21 MED ORDER — POTASSIUM CHLORIDE ER 20 MEQ PO TBCR
40.00 meq | EXTENDED_RELEASE_TABLET | ORAL | Status: DC
Start: ? — End: 2017-08-21

## 2017-08-21 MED ORDER — PANTOPRAZOLE SODIUM 40 MG OR TBEC
40.00 mg | DELAYED_RELEASE_TABLET | ORAL | Status: DC
Start: ? — End: 2017-08-21

## 2017-08-21 MED ORDER — FAMOTIDINE 20 MG/2ML IV SOLN
20.00 mg | INTRAVENOUS | Status: DC
Start: ? — End: 2017-08-21

## 2017-08-21 MED ORDER — ASPIRIN 81 MG OR CHEW
81.00 mg | CHEWABLE_TABLET | ORAL | Status: DC
Start: ? — End: 2017-08-21

## 2017-08-21 MED ORDER — DOCUSATE SODIUM 100 MG OR CAPS
100.00 mg | ORAL_CAPSULE | ORAL | Status: DC
Start: ? — End: 2017-08-21

## 2017-08-21 MED ORDER — ATORVASTATIN CALCIUM 10 MG OR TABS
10.00 mg | ORAL_TABLET | ORAL | Status: DC
Start: ? — End: 2017-08-21

## 2017-08-21 MED ORDER — HYDROMORPHONE HCL 2 MG OR TABS
2.00 mg | ORAL_TABLET | ORAL | Status: DC
Start: ? — End: 2017-08-21

## 2017-08-21 MED ORDER — ZOLPIDEM TARTRATE 5 MG OR TABS
5.00 mg | ORAL_TABLET | ORAL | Status: DC
Start: ? — End: 2017-08-21

## 2017-08-21 MED ORDER — SODIUM CHLORIDE 0.9 % IV SOLN
500.00 mL | INTRAVENOUS | Status: DC
Start: ? — End: 2017-08-21

## 2017-08-21 MED ORDER — INSULIN LISPRO (HUMAN) 100 UNIT/ML SC SOLN
2.00 [IU] | SUBCUTANEOUS | Status: DC
Start: ? — End: 2017-08-21

## 2017-08-21 MED ORDER — IRON PO
1.00 | ORAL | Status: DC
Start: ? — End: 2017-08-21

## 2017-08-29 ENCOUNTER — Encounter (INDEPENDENT_AMBULATORY_CARE_PROVIDER_SITE_OTHER): Payer: Self-pay | Admitting: Hepatology

## 2017-08-29 ENCOUNTER — Encounter (INDEPENDENT_AMBULATORY_CARE_PROVIDER_SITE_OTHER): Payer: Medicaid Other | Admitting: Hepatology

## 2017-08-29 ENCOUNTER — Ambulatory Visit (INDEPENDENT_AMBULATORY_CARE_PROVIDER_SITE_OTHER): Payer: Medicaid Other | Admitting: Hepatology

## 2017-08-29 VITALS — BP 121/64 | HR 79 | Temp 98.1°F | Resp 16 | Ht 63.0 in | Wt 176.0 lb

## 2017-08-29 DIAGNOSIS — M069 Rheumatoid arthritis, unspecified: Secondary | ICD-10-CM

## 2017-08-29 DIAGNOSIS — K7581 Nonalcoholic steatohepatitis (NASH): Secondary | ICD-10-CM

## 2017-08-29 DIAGNOSIS — K7689 Other specified diseases of liver: Secondary | ICD-10-CM

## 2017-08-29 NOTE — Progress Notes (Signed)
Attending Attestation:    I personally interviewed and examined the patient on 08/30/2017 , and I have reviewed the note by Dr. Rigoberto Noel from 08/30/2017 .    I agree w/ the resident history, exam, assessment, and plan with the following additions:     Additional attending documentation:     Labs reviewed in care everywhere from 08/20/17. a1c 6.6    I personally reviewed outside MRI in [place] from 06/09/17 and my interpretation is as follows: complex mass at the dome of the liver, some suboptimal images. Complex material. Not obviously growing. Few simple cysts scattered throughout.     IMPRESSION:  1. Redemonstration of a 6.8 cm T1 hyperintense lesion at the liver dome which is suboptimally evaluated given artifact at the liver dome, but is unchanged in size and morphology compared to 2017. Given the artifact, the liver dome parenchyma is incompletely assessed.  Repeat MRI with contrast is recommended for further evaluation, at no additional charge to the patient. At the time of the repeat MRI an addendum can be issued to this report.    2. Moderate to severe hepatic steatosis 15-26%.    3. Estimated stiffness: mean 2.6 kPa, no detectable degree of fibrosis.      Cathy Lucas is a 67 yo hispanic female with a past medical history of RA. DM, HTN, HLD, as well as known hepatic cyst (dx around 2006 per notes but patient said actually late 90s, has had bleed into cyst 2016 and thinks maybe again 2017), here for follow up for liver cyst and biopsy proven NASH.     # Liver Cyst?: long standing. 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 minimal growth last several years. Able to get prior images but all occurred after suspected bleed.   -- repeat MRI given suboptimal imaging at the dome  -- if this lesion needs to be treated, likely needs surgery > IR.  Cyst location is not ideal at the dome and patient is anticoagulated with multiple comorbidities. Would  only pursue rx if truly felt to have malignant potential.     # NASH: biopsy proven NASH with fibrosis  -- labs improved with weight loss. 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   -- Risk modification:      1. Current BMI: rising. Discussed diet/excercise. Given issues with joint pain she can consider swimming. Needs to see nutrition. She will talk to her endocrinologist and her PCP to see if she can get a reerral closer to home.      2. HLD: management per primary care physician. No contra-indication to statins in this population.      3. Diabetes: management per primary care physician. a1c 6.6. She is not on medications.      4. Her rheumatologist is considering putting her back on steroids. This is not ideal in the setting of her diabetes, nash can cardiac disease. She would like to get a second opinion. Have referred to [facility].   -- Medications: none at this time. Doubt would be candidate for clinical trial with numerous comorbidities and she is not interested in any additional meds. I don't think she is a great candidate for pioglitazone with cardiac issues and hypoglycemia episodes. Vit E can be considered but enzymes normal and fibrosis minimal on MRE.     >50% of the 30 minute visit was spent in direct counseling  and coordination of care. All of the patient's questions were answered. The following was discussed weight loss strategies, cyst follow up, NASH natrual history, recent MRE

## 2017-08-29 NOTE — Patient Instructions (Addendum)
Please go to the lab in the next 2-3 months to get blood work including checking cholesterol.     Diet goal: I recommend cutting down on carbohydrates and especially foods with added sugar. Avoid sodas and fruit drinks. Avoid foods that are high in fat. The Mediterranean diet has been studied and may be effective.     Exercise long term goal: Exercise, with an activity that increases your heart rate for at least 30 minutes, done at least 5 times a week. Swimming would be a great way to do this!    You should avoid alcohol.     Talk to Dr. Skip Estimable or your primary in [place] about getting set up with a nutritionist.     You have been referred to the Department of Rheumatology for a clinic consult. You may call the clinic directly to schedule your appointment. It is not necessary to wait for insurance authorization.     Rheumatology Department scheduling phone numbers:  Rheumatology phone numbers:  [place]: [phone]  [place]: [phone]  [place]: [phone]  We look forward to serving your Rheumatology needs.    Liver cyst imaging: please get this done in the next 4-8 weeks. I have ordered an MRI. Please call [facility] Scheduling at [phone] to schedule your appointment.       As a reminder, between visits, please do not go to the lab without confirming you have lab orders in the computer. You may call the lab at [phone] to confirm.     It was nice seeing you today.  If you have any questions or concerns please do not hesitate to call your care team    Marlowe Kays, M.D.  Assistant Professor  [facility]      My Care team    Wetzel Bjornstad NP  Hepatology Nurse Practitioner: medication questions and clinic visits when you are stable and doing well or when you need urgent follow up.     Rhys Martini, RN   Hepatology Nurse: refill medications or medical questions  Direct Line: [phone]    Call Center  Appointments, procedures or insurance issues  Phone: [phone]
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
## 2017-10-26 NOTE — Progress Notes (Signed)
Unable to close encounter without entering additional note- original case ID was cancelled and surgery proceeded under a separate encounter.

## 2018-03-06 ENCOUNTER — Telehealth (HOSPITAL_BASED_OUTPATIENT_CLINIC_OR_DEPARTMENT_OTHER): Payer: Self-pay | Admitting: Hepatology

## 2018-03-06 DIAGNOSIS — R160 Hepatomegaly, not elsewhere classified: Secondary | ICD-10-CM

## 2018-03-06 DIAGNOSIS — K7581 Nonalcoholic steatohepatitis (NASH): Secondary | ICD-10-CM

## 2018-03-06 NOTE — Telephone Encounter (Signed)
Opened in error

## 2018-03-06 NOTE — Telephone Encounter (Signed)
68 y/o female with cystic liver lesion and NASH.    Chart reviewed for upcoming visit.  Last visit:  08/29/17 Dr. Marykay Lex    Imaging: MRI ordered, not done.  Labs: Expired, orders pended.  Misc: Referring patient to rheumatology clinic. Patient has not scheduled.    Routing to provider for lab orders.   Will contact patient once signed.

## 2018-03-07 NOTE — Telephone Encounter (Signed)
LMM for c/b    Please let patient know to complete labs and abdominal imaging prior to her appointment w/ Dr. Marykay Lex on 03/13/18. Orders have been placed.   Thank you
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
## 2018-03-08 NOTE — Telephone Encounter (Signed)
Patient returning call regarding message below. Appointment with Dr. Marykay Lex on 03/13/2018 has been cancelled. Patient is requesting clarification on her labs that need to be completed and would like to know if she can have her [name] sent to [place]. Thank you.

## 2018-03-08 NOTE — Telephone Encounter (Signed)
Pt is scheduled with Dr. Marykay Lex 03/13/18 and R. Irene Pap, NP 03/14/18.  Per last AVS patient to follow up with R. Irene Pap, NP.   Left message to confirm appt should be with R. Irene Pap, NP and cancel appt with Dr. Marykay Lex.

## 2018-03-08 NOTE — Telephone Encounter (Signed)
RN call to pt, no answer, left a voicemail to call back    Future Appointments   Date Time Provider [place]   03/14/2018 10:00 AM Soto, Elsie Ra, NP [facility]

## 2018-03-12 NOTE — Progress Notes (Signed)
March 14, 2018    Location: [facility], Prudence Davidson     Hepatologist: Marlowe Kays, MD    Visit Provider: Wetzel Bjornstad, NP     Chief Complaint   Patient presents with   . Follow Up   . 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 Last seen in Hepatology by Dr. Marykay Lex on 08/29/2017 new to this NP today. Pt is here today with her son has been feeling well. Endorses that had labs drawn at [facility] in [place] but no lab results were available in clinic at time of visit.  Pt also has pending follow up imaging of liver and cyst but due to insurance must have this done at [place], which per pt is still pending insurance authorization. Pt denies ascites, edema, hematemesis, hematochezia, denies melena, jaundice, pruritis or acholic stools, denies hepatic encephalopathy (no overt encephalopathy, no reversal of sleep/wake cycles, no memory/attention issues). She has chronic pain from RA and some LUQ pain but no RUQ pain, we reviewed and updated her medications list. Her weight is the same as her visit 6 months ago.     Patient Active Problem List    Diagnosis Date Noted   . S/P mitral valve repair 12/27/2016   . Mitral stenosis 12/26/2016   . Pulmonary HTN (CMS-HCC) 11/16/2016     Added automatically from request for surgery [id number]     . NASH (nonalcoholic steatohepatitis) 08/30/2016   . Liver cyst 08/30/2016   . BMI 27.0-27.9,adult 08/30/2016   . Abdominal pain 10/10/2014   . Liver mass 10/10/2014     11/25/15 MRI Abd W/WO Contrast ([facility]): Stable appearence of a cystic  lesion in the right lobe of the liver near the hepatic dome measuring 3.9 x 6.1 6.5 cm. 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 However, a hemorrhagic cyst remains in the differential . No aggressive/malignant features are noted. However, continued follow-up is recommended to assess stability.  Stable appearence of several additional small simple cysts within the liver.   Previous cholecystectomy.    11/24/14 MRI Abd W/WO Contrast ([facility]): A well-circumscribed T1 hyperintense cystic lesion in the right hepatic lobe measures 7.6 x 7.7 cm (20-10) with nonenhancing nodularity seen inferiorly and with a single internal septation. This lesion is similar in size and appearance to the MRI examination perfolrmed on 11/09/2011. Similar smaller cystic lesions are seen at the periphery of hepatic segments six and seven. All of these lesions are unchanged from prior.     10/12/11 [place] Abd ([facility]): Hepatic steatosis. Septated right hepatic cyst. Hypervascular possibly solid lesion in the right lobe. Depending on clinical considerations, further evaluation with pre-and post contrast enhanced MR and CT may be of benefit for further evaluation of these lesions. Cholecystectomy.      06/09/2015 Liver Bx [facility]): Steatohepatitis with periportal and focal septal fibrosis(stage 2-3 of 4). Small focus of benign epithelial lining suggestive of benign cyst, with  adjacent chronic inflammation, reactive fibrosis, and hemosiderin depostition B. Liver asparte fluid: hypocellular specimen. Rare benign hepatocysts and macrophages. No malignant cells identified.     . Hypertension 10/10/2014   . Hypothyroidism 10/10/2014   . Fibromyalgia 10/08/2009         Past Medical History:   Diagnosis Date   . Back pain     Needs L4 fusion   . Depression    . Diabetes (CMS-HCC)    . Fibromyalgia    . GERD (gastroesophageal reflux disease)    . Glaucoma    . Hypercholesterolemia    . Hypertension     . Hypothyroidism    . Migraines    . PAF (paroxysmal atrial fibrillation) (CMS-HCC)         Past Surgical History:   Procedure Laterality Date   . MITRAL VALVE REPAIR  12/26/2016    TMVR   . LIVER BIOPSY  05/22/2015    steatohepatitis with periportal and focal septal fibrosis(stage 2-3 of 4). Small focus of benign epithelial lining suggestive of benign cyst, with adjacent chronic inflammation, reactive fibrosis, and hemosiderin depostition B. Liver asparte fluid: hypocellular specimen. Rare benign hepatocysts and macrophages. No malignant cells identified.   . ESOPHAGOGASTRODUODENOSCOPY  03/11/2015   . COLONOSCOPY  05/31/2012    normal mucosa with internal hemorrhoids.    Marland Kitchen BILE DUCT STENT PLACEMENT     . CHOLECYSTECTOMY     . HYSTERECTOMY     . TONSILLECTOMY AND ADENOIDECTOMY         Family History   Problem Relation Name Age of Onset   . Stroke Mother     . Heart Disease Mother     . Stroke Father     . Heart Disease Father         Social History     Socioeconomic History   . Marital status: Married     Spouse name: Not on file   . Number of children: Not on file   . Years of education: Not on file   . Highest education level: Not on file   Occupational History   . Not on file   Social Needs   . Financial resource strain: Not on file   . Food insecurity:     Worry: Not on file     Inability: Not on file   . Transportation needs:     Medical: Not on file     Non-medical: Not on file   Tobacco Use   . Smoking status: Never Smoker   . Smokeless tobacco: Never Used   Substance and Sexual Activity   . Alcohol use: No   . Drug use: No   . Sexual activity: Not on file   Lifestyle   . Physical activity:     Days per week: Not on file     Minutes per session: Not on file   . Stress: Not on file   Relationships   . Social connections:     Talks on phone: Not on file     Gets together: Not on file     Attends religious service: Not on file     Active member of club or organization: Not on file     Attends meetings of clubs  or organizations: Not on file     Relationship status: Not on file   . Intimate partner violence:     Fear of current or ex partner: Not on file     Emotionally  abused: Not on file     Physically abused: Not on file     Forced sexual activity: Not on file   Other Topics Concern   . Caffeine Concern Not Asked   Social History Narrative   . Not on file       Allergies   Allergen Reactions   . Ceftriaxone Rash   . Iodine Rash   . Iv Contrast [Contrast Media] Anaphylaxis     Itching swelling unable to breathe     . Latex Rash   . Metformin Rash   . Ativan [Lorazepam] Other     Heart palpitations     . Januvia [Sitagliptin] Other     Sob, chest pain, heart palpitations     . Macrobid [Nitrofurantoin] Unspecified   . Tylenol [Acetaminophen] Other     bc of the lesion of the liver.       Current Outpatient Medications on File Prior to Visit   Medication Sig Dispense Refill   . aspirin 81 MG tablet Take 81 mg by mouth daily.     Marland Kitchen atorvastatin (LIPITOR) 10 MG tablet Take 10 mg by mouth daily.     . Buprenorphine HCl (BELBUCA) 150 MCG FILM Suck 150 mcg in mouth 2 times daily.     . carvedilol (COREG) 25 MG tablet Take 25 mg by mouth 2 times daily (with meals).     . Cholecalciferol (VITAMIN D PO) Take 50,000 capsules by mouth once a week.       . clopidogrel (PLAVIX) 75 MG tablet Take 75 mg by mouth daily.     . cyclobenzaprine (FLEXERIL) 10 MG tablet      . diphenhydrAMINE (BENADRYL) 25 MG capsule Take 50 mg by mouth every 6 hours as needed for Itching.       . furosemide (LASIX) 20 MG tablet Take 20 mg by mouth as needed.     Marland Kitchen HYDROmorphone (DILAUDID) 2 MG tablet Take 2 mg by mouth every 4 hours as needed for Moderate Pain (Pain Score 4-6).     Marland Kitchen latanoprost (XALATAN) 0.005 % ophthalmic solution Place 1 drop into both eyes every evening.     Marland Kitchen levothyroxine (SYNTHROID) 100 MCG tablet Take 112 mcg by mouth daily.       Marland Kitchen lidocaine (XYLOCAINE) 5 % ointment      . MOVANTIK 25 MG TABS tablet      . ondansetron (ZOFRAN) 8 MG  tablet Take 8 mg by mouth every 8 hours as needed for Nausea/Vomiting.     . pantoprazole (PROTONIX) 40 MG tablet Take 40 mg by mouth daily.     . [DISCONTINUED] warfarin (COUMADIN) 5 MG tablet Take 1 tablet (5 mg) by mouth nightly. Take 5 mg nightly for 3 days and then 1/2 tablet nightly 90 tablet 3   . zolpidem (AMBIEN) 5 MG tablet Take 5 mg by mouth nightly as needed for Insomnia.       No current facility-administered medications on file prior to visit.           Review of Systems -  Constitutional: no fever, chills or weight gain  Eyes: no vision changes  HENT: no headaches, nose bleeds or oral lesions  Cardiovascular: no chest pain or peripheral edema  Respiratory: no SOB, dyspnea or hemoptysis  Gastrointestinal: no hematemesis, hematochezia or melena. No abdominal distention  Genitourinary: no dysuria  Musculoskeletal: no pain or swelling of muscles or joints  Integumentary: no rashes or jaundice  Neurological: no  tremors or difficulties with memory  Psychiatric: Negative  Endocrine: no cold/heat intolerance, no excessive sweating or polyuria  Hematologic/Lymphatic: no easy bruising or bleeding  Allergic/Immunologic: negative, see allergy list  12 point ROS is otherwise negative    Liver Specific Review of Systems-  Confusion: No  Abdominal distention: No  Melena: No  Hematemesis: No  Hematochezia: No  Lower extremity edema: No  Jaundice: No        Physical Exam: (9 systems examined)     Vital Signs: BP 102/59 (BP Location: Left arm, BP Patient Position: Sitting, BP cuff size: Regular)   Pulse 80   Temp 97.6 F (36.4 C) (Oral)   Resp 18   Ht 5' 3"  (1.6 m)   Wt 79.8 kg (176 lb)   SpO2 96%   BMI 31.18 kg/m      BMI: Body mass index is 31.18 kg/m.     Systems:  GEN: appears well, in no apparent distress. Alert and oriented times three, pleasant and cooperative.  HEENT: no sclera icterus, O/P: mmm no lesions  Lymph nodes:  negative lymph nodes  Pulmonary: CTAB, no wheezes, good air  movement  Cardiovascular: regular rate & rhythm,  no murmurs, gallops or rubs, no peripheral edema  Gastrointestinal: BS x4, soft NTND, liver and spleen not palpable  Rectal and genitalia: Not examined  Neurologic including mental status: Alert & oriented x3. No encephalopathy or asterixis  Dermatologic: no jaundice, rash, or spider angiomata  Musculoskeletal: Normal       Laboratory:   Results for orders placed or performed during the hospital encounter of 01/25/17   Complete 2D ECHO with Image Enhancement Agent if Necessary   Result Value Ref Range    IVC Diameter 0.96 cm    LA Volume Index 22.9 ml/m    LV Ejection Fraction 70 %    Mitral Mean Gradient 7.0 mmHg        Endoscopy:      Pathology:      Imaging:   06/09/2017 MRI/MRE  . Redemonstration of a 6.8 cm T1 hyperintense lesion at the liver dome which is suboptimally evaluated given artifact at the liver dome, but is unchanged in size and morphology compared to 2017. Given the artifact, the liver dome parenchyma is incompletely assessed.  Repeat MRI with contrast is recommended for further evaluation, at no additional charge to the patient. At the time of the repeat MRI an addendum can be issued to this report.  2. Moderate to severe hepatic steatosis 15-26%.  3. Estimated stiffness: mean 2.6 kPa, no detectable degree of fibrosis.    I have reviewed all lab documents in media and imaging and procedures in EPIC    Assessment and Plan:  Cathy Lucas is a 69 year old female with a past medical history ofRA.DM, HTN, HLD, as well as known hepatic cyst (dx around 2006 per notes but patient said actually late 90s, has had bleed into cyst 2016and thinks maybe again 2017), here for follow up for liver cyst and biopsy proven NASH.     Liver Cystlong standing.   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 minimal growth last several years. Able to get prior images but all occurred  after suspected bleed.   -- repeat MRI given suboptimal imaging at the dome  -- if this lesion needs to be treated, likely needs surgery > IR.  Cyst location is not  ideal at the dome and patient is anticoagulated with multiple comorbidities. Would only pursue rx if truly felt to have malignant potential.   - Pending imaging at [facility] due to insurance    NASH: biopsy proven NASH with fibrosis  -- labs improved with weight loss. 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   - labs drawn 03/12/2018 unavailable at time of office visit from [facility] requested results   -- Risk modification:   1. Current BMI: stable Discussed diet/excercise. Given issues with joint pain she can consider swimming. Needs to see nutrition. She will talk to her endocrinologist and her PCP  2. HLD: management per primary care physician. No contra-indication to statins in this population.   3. Diabetes: management per primary care physician. a1c 6.6. She is not on medications.      4. Her rheumatologist is considering putting her back on steroids. This is not ideal in the setting of her diabetes, nash can cardiac disease. She would like to get a second opinion. Have referred to [facility].   -- Medications:none at this time. Doubt would be candidate for clinical trial with numerous comorbidities and she is not interested in any additional meds. I don't think she is a great candidate for pioglitazone with cardiac issues and hypoglycemia episodes. Vit E can be considered but enzymes normal and fibrosis minimal on MRE.     Rheumatoid arthritis  - referring patient to rheumatology clinic to discuss restarting medications for RA, specifically steroid-sparing medications if possible given patient's concurrent metabolic syndrome and NASH    HCM:   - HAV: hepatitis A antibody ordered  - HBV: not immune   - flu vaccine refused    Follow up in hepatology in 3-4 months schedule imaging     Wetzel Bjornstad, MSN  FNP-BC  Hepatology Nurse Practitioner  [facility]

## 2018-03-13 ENCOUNTER — Encounter (INDEPENDENT_AMBULATORY_CARE_PROVIDER_SITE_OTHER): Payer: Medicaid Other | Admitting: Hepatology

## 2018-03-13 ENCOUNTER — Encounter (INDEPENDENT_AMBULATORY_CARE_PROVIDER_SITE_OTHER): Payer: Self-pay

## 2018-03-14 ENCOUNTER — Encounter (INDEPENDENT_AMBULATORY_CARE_PROVIDER_SITE_OTHER): Payer: Self-pay | Admitting: Nurse Practitioner

## 2018-03-14 ENCOUNTER — Telehealth (INDEPENDENT_AMBULATORY_CARE_PROVIDER_SITE_OTHER): Payer: Self-pay | Admitting: Nurse Practitioner

## 2018-03-14 ENCOUNTER — Ambulatory Visit (INDEPENDENT_AMBULATORY_CARE_PROVIDER_SITE_OTHER): Payer: Medicaid Other | Admitting: Nurse Practitioner

## 2018-03-14 VITALS — BP 102/59 | HR 80 | Temp 97.6°F | Resp 18 | Ht 63.0 in | Wt 176.0 lb

## 2018-03-14 DIAGNOSIS — I10 Essential (primary) hypertension: Secondary | ICD-10-CM

## 2018-03-14 DIAGNOSIS — I050 Rheumatic mitral stenosis: Secondary | ICD-10-CM

## 2018-03-14 DIAGNOSIS — R160 Hepatomegaly, not elsewhere classified: Secondary | ICD-10-CM

## 2018-03-14 DIAGNOSIS — Z9889 Other specified postprocedural states: Secondary | ICD-10-CM

## 2018-03-14 DIAGNOSIS — K7581 Nonalcoholic steatohepatitis (NASH): Secondary | ICD-10-CM

## 2018-03-14 MED ORDER — MOVANTIK 25 MG PO TABS
ORAL_TABLET | ORAL | Status: DC
Start: 2018-02-22 — End: 2019-03-26

## 2018-03-14 MED ORDER — CLOPIDOGREL BISULFATE 75 MG OR TABS
75.00 mg | ORAL_TABLET | Freq: Every day | ORAL | Status: DC
Start: ? — End: 2021-03-29

## 2018-03-14 MED ORDER — LIDOCAINE 5 % EX OINT
TOPICAL_OINTMENT | CUTANEOUS | Status: DC
Start: 2018-02-27 — End: 2020-03-22

## 2018-03-14 MED ORDER — CYCLOBENZAPRINE HCL 10 MG OR TABS
10.00 mg | ORAL_TABLET | ORAL | Status: AC | PRN
Start: 2018-02-21 — End: ?

## 2018-03-14 NOTE — Patient Instructions (Addendum)
1 I have given you a mychart access code  2. We will get your labs from [facility] and I will contact you through Mychart for results  3. Please get your Imaging done at [facility] let us know when you get this done  Thank you for allowing me to participate in your care today.    As a reminder, please do not go to the lab without confirming you have lab orders in the computer. You may call the lab at [phone] to confirm.     Please do not hesitate to call your Care Team at [phone] if you have any questions or concerns.    Marlowe Kays, M.D.  Hepatologist    Wetzel Bjornstad, MSN, FNP-BC  Hepatology Nurse Practitioner    Rhys Martini, RN refill medications or medical questions  [place]: [phone]      Appointments, procedures or insurance issues  [place] [facility] call center  [employer] [phone]

## 2018-03-14 NOTE — Telephone Encounter (Signed)
At AVS Patient had additional questions regarding [name] for [facility].    Patient would like to know how [facility] will know that she cannot have contrast and how soon will [facility] have the [name] so she can schedule (or will [facility] call and schedule the Patient)?

## 2018-03-14 NOTE — Telephone Encounter (Signed)
Called pt, per pt just was at appt-Robin told her they were working on it.

## 2018-03-14 NOTE — Telephone Encounter (Signed)
Pt seen in clinic 03/14/2018 Had labs drawn at [facility] in [place]  Results not available in [place] at time of office visit    Routing to [place] to obtain results

## 2018-03-15 ENCOUNTER — Telehealth (INDEPENDENT_AMBULATORY_CARE_PROVIDER_SITE_OTHER): Payer: Self-pay | Admitting: Nurse Practitioner

## 2018-03-15 NOTE — Telephone Encounter (Signed)
No Answer. Left a message to call back; Hep phone number included.     Called to confirm if did recent labs around 03/11/18

## 2018-03-15 NOTE — Telephone Encounter (Signed)
68 yo female seen in clinic on 03/14/2018.  Pt explicitly stated that she had labs done at [facility] 3 days prior   [facility] only has labs from 06/2017    Please call patient and verify where she had labs drawn 3 days ago and obtain results

## 2018-03-15 NOTE — Telephone Encounter (Addendum)
Most recent Lab results found with [facility] are from 07/04/2017 and results have been scanned.

## 2018-03-15 NOTE — Telephone Encounter (Signed)
Left patient a message and requesting a call back to discuss her MRI at [facility].    Routing message to Wetzel Bjornstad, NP to advising on patient's allergy to IV contrast.

## 2018-03-19 NOTE — Telephone Encounter (Signed)
Please obtain lab results drawn 03/14/18 at [facility] in [place].

## 2018-03-19 NOTE — Telephone Encounter (Addendum)
I have attempted to contact this patient by phone with the following results:   Left a detailed message, requesting for patient to return call.    Call Center: Please find out where patient had labs done? We checked [facility]'s website and latest lab results are from 06/2017 and they do not have recent lab results.    We called [facility] Ph. [phone] and left a detailed message to try and obtain lab results if available requested for them to fax them.

## 2018-03-20 NOTE — Telephone Encounter (Signed)
Patient returning call regarding message below. Patient stated that she had completed her labs at [facility] in [place] [address]. Ph: [phone]. Connected patient with [place].

## 2018-03-20 NOTE — Telephone Encounter (Signed)
[name] was faxed to [facility] as patient requested to Fax [phone]. Patient will contact them to schedule.     Referral was sent to our authorizations unit to obtain auth.     "68 y/o female with cystic liver lesion and NASH. Needs abd MRI for Liver - Tumor Evaluation. Patient is requesting to have MRI imaging done at [facility].  [place], [place] [address]  Ph. [phone]   Tax ID [id number]  NPI [id number]  Fax [phone]  DOS:[id number]"

## 2018-03-20 NOTE — Telephone Encounter (Signed)
Patient calling to request that MRI be sent to [facility] Ph. [phone] stating that she is allergic to contrast and needs to have this MRI done in Hospital setting.     Patient will contact [facility] to help expedite our request that was made yesterday. Provided her with our fax number.

## 2018-03-20 NOTE — Telephone Encounter (Signed)
Called [facility] at Ph. [phone] and spoke with Ommi to request lab results. Per Ommi patients latest labs are from 03/11/2018 and she will be faxing them to [place].     Please scan into media once received and route to RN. Thank you.

## 2018-03-22 NOTE — Telephone Encounter (Signed)
LOV: 03/14/2018-NASH    Labs in Media    Glu-185  Alkaline phos-139  trigly-159  eGFR-81    Hep A Ab-negative    All other Results WNL    Document on 03/22/2018 1:39 PM by Annett Gula: [facility] Lab results 03-11-2018    Routing to NP Irene Pap

## 2018-03-22 NOTE — Telephone Encounter (Signed)
03/11/2018 Lab results from [facility] were received and scanned into media for your review.

## 2018-06-06 ENCOUNTER — Telehealth (INDEPENDENT_AMBULATORY_CARE_PROVIDER_SITE_OTHER): Payer: Self-pay | Admitting: Nurse Practitioner

## 2018-06-06 NOTE — Telephone Encounter (Signed)
1st attempt: Made an outbound call to patient to inform that we are cancelling all in person visits and converting them to [facility] video visits. Patient did not answer. Left voicemail for patient to call our clinic back at [phone]. If patient calls back, please sign patient up for MyChart, convert appointment to video visit, update appointment notes, and e-mail patient the Video Visit tip sheet. Thank you

## 2018-06-11 NOTE — Telephone Encounter (Signed)
Pt returned call to reschedule appt. She has not done labs or imaging requested at last visit due to lockdown. Expressed fear of "leaving the house". Rescheduled for 07/17/2018 at 2pm w/R. Irene Pap, NP. Pt will contact [facility] to schedule MRI and do labs as well. Stated that she "just needs more time to feel safe again".

## 2018-06-19 ENCOUNTER — Encounter (INDEPENDENT_AMBULATORY_CARE_PROVIDER_SITE_OTHER): Payer: Medicare Other | Admitting: Nurse Practitioner

## 2018-07-08 ENCOUNTER — Telehealth (INDEPENDENT_AMBULATORY_CARE_PROVIDER_SITE_OTHER): Payer: Self-pay | Admitting: Nurse Practitioner

## 2018-07-08 NOTE — Telephone Encounter (Signed)
1st attempt 07/08/18, called and left vm regarding [place] conversion.(cs)

## 2018-07-08 NOTE — Telephone Encounter (Signed)
Emailed instructions & activation link

## 2018-07-12 NOTE — Telephone Encounter (Signed)
Please ask patient if she had her MRI at [facility]. If so, please obtain report.

## 2018-07-12 NOTE — Telephone Encounter (Signed)
Pt called to cancel appt. Has not scheduled MRI w/ contrast. Will call to schedule f/u appt once she scheduled MRI. Still not feeling safe due to COVID situation. Routing for awareness.

## 2018-07-17 ENCOUNTER — Telehealth (INDEPENDENT_AMBULATORY_CARE_PROVIDER_SITE_OTHER): Payer: Medicare Other | Admitting: Nurse Practitioner

## 2018-07-22 NOTE — Telephone Encounter (Signed)
I have attempted to contact this patient by phone with the following results:   Left a detailed message, requesting for patient to return call.    Call Team:    Please inquire with patient to see if she completed MRI, please ask where she had it done?    Schedule f/u appt with NP Wetzel Bjornstad

## 2018-09-26 ENCOUNTER — Telehealth (HOSPITAL_BASED_OUTPATIENT_CLINIC_OR_DEPARTMENT_OTHER): Payer: Self-pay | Admitting: Hepatology

## 2018-09-26 DIAGNOSIS — Z91041 Radiographic dye allergy status: Secondary | ICD-10-CM

## 2018-09-26 DIAGNOSIS — R1011 Right upper quadrant pain: Secondary | ICD-10-CM

## 2018-09-26 DIAGNOSIS — R110 Nausea: Secondary | ICD-10-CM

## 2018-09-26 NOTE — Telephone Encounter (Signed)
No Answer. Left a message to call back to discuss sxs. Dept. Number included. My Spanish ID #[id number]

## 2018-09-26 NOTE — Telephone Encounter (Signed)
Pt called to request to speak with nurse. She has questions/concerns about an MRI that Dr. Marykay Lex ordered back in Jan 2020. She has not done requested imaging due to covid concerns. Still scared but knows "that it needs to be done". Reporting that she is allergic to the contrast and is worried about fasting for 8 hours since she is diabetic. MRI to be done at [facility]. Order faxed to [phone]. Requesting a call back to discuss.

## 2018-09-26 NOTE — Telephone Encounter (Signed)
S/w she CB- discussed MRI only needs to fast 2 hrs prior and advised to do. She will call to schedule. Pt v/u.

## 2018-10-11 MED ORDER — ONDANSETRON HCL 8 MG OR TABS
8.00 mg | ORAL_TABLET | Freq: Three times a day (TID) | ORAL | 0 refills | Status: DC | PRN
Start: 2018-10-11 — End: 2018-10-11

## 2018-10-11 MED ORDER — ONDANSETRON HCL 8 MG OR TABS
8.00 mg | ORAL_TABLET | Freq: Three times a day (TID) | ORAL | 1 refills | Status: AC | PRN
Start: 2018-10-11 — End: ?

## 2018-10-11 NOTE — Telephone Encounter (Signed)
Routing to NP [facility] for review (Ondansetron pended if agreeable)    Call placed to patient. She states she is having the same RUQ abominal pain as she did in January, but states pain has started  increasing over last 2-3 months. Intermittent abdominal pain and nausea when she is hungry, heating pad helps. ED precautions reviewed. Verbalized understanding.    States has not had the MRI done yet d/t being nervous about contracting COVID and being diabetic. Advised patient about importance of scheduling her MRI, so that providers will be able to better help her resolve her symptoms. Also reassured patient that Radiology is taking precautions with every patient and safety is the number one goal. Verbalized understanding and is agreeable to scheduling MRI. Advised patient [name] was faxed to [facility] on 8/6, and once scheduled to call our office back to schedule her f/u appointment w/ Dr. Marykay Lucas as outlined in [place]. Patient agreeable.    Patient also requesting a refill of her Ondansetron 13m, states her GI MD was the last one to write for the RX, but she has not seen him in 2 years. Advised I would pass message along to provider and if agreeable will let her know.    03/14/2018 Office Visit w/ NP Soto  Assessment and Plan:  Cathy Lucas Fifieldis a 68year old female with a past medical history ofRA.DM, HTN, HLD, as well as known hepatic cyst (dx around 2006 per notes but patient said actually late 90s, has had bleed into cyst 2016and thinks maybe again 2017), here for follow up for liver cyst and biopsy proven NASH.     Liver Cystlong standing.   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 minimal growth last several years. Able to get prior images but all occurred after suspected bleed.   -- repeat MRI given suboptimal imaging at [facility]  -- if this lesion needs to be treated, likely needs surgery > IR.Cyst location is not ideal at  the [facility] and patient is anticoagulated with multiple comorbidities. Would only pursue rx if truly felt to have malignant potential.  - Pending imaging at [facility] due to insurance    NASH: biopsy proven NASH with fibrosis  -- labs improved with weight loss. 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  - labs drawn 03/12/2018 [facility]   -- Risk modification:   1. Current BGYI:RSWNIODiscussed diet/excercise. Given issues with joint pain she can consider swimming. Needs to see nutrition. She will talk to her endocrinologist and her PCP  2. HLD: management per primary care physician. No contra-indication to statins in this population.   3. Diabetes: management per primary care physician.a1c 6.6. She is not on medications.   4. Her rheumatologist is considering putting her back on steroids. This is not ideal in the setting of her diabetes, nash can cardiac disease. She would like to get a second opinion. [facility]   -- Medications:none at this time. Doubt would be candidate for clinical trial with numerous comorbidities and she is not interested in any additional meds. I don't think she is a great candidate for pioglitazone with cardiac issues and hypoglycemia episodes. Vit E can be consideredbut enzymes normal and fibrosis minimal on MRE.    Rheumatoid arthritis  - referring patient to rheumatology clinic to discuss  restarting medications for RA, specifically steroid-sparing medications if possible given patient's concurrent metabolic syndrome and NASH    HCM:   - HAV: hepatitis A antibody ordered  - HBV: not immune  - flu vaccine refused    Follow up in hepatology in 3-4 months schedule imaging

## 2018-10-11 NOTE — Telephone Encounter (Signed)
Patient calling to request a new STAT order for MRI. States she wants to have it done at [facility] due to now having pain on RUQ of abdomen.     Last Office Visit: 03/14/18     Next Office Visit: No future appointment scheduled    What are the symptoms: Pain on right side under rib cage, travels sometimes to appendix to middle of stomach and radiates to back.     Pain level 0-10: Fluctuates but sometimes its a 10    When did the symptoms start: 2-3 months ago    Where is the pain located:  RUQ of abdomen    Is there any bleeding: No    Best Call Back # [phone]    Best Call back time: Afternoon    Is it OK to leave a voicemail: Yes

## 2018-10-11 NOTE — Telephone Encounter (Signed)
Called patient to advise RX was signed and sent to her pharmacy.

## 2018-10-11 NOTE — Telephone Encounter (Signed)
Pt states that [facility] does not due MRI w/ contrast. Pt spoke to Jessi at [facility] who was the one who told her they don't do them there. Will be calling insurance to see what other facilities she can go to. Looking into [facility] (where she's gone before) or [facility]. Will call back to provide info.    If pt calls back please fax order to facility she wants it sent to.    Routing for awareness.

## 2018-10-17 MED ORDER — DIPHENHYDRAMINE HCL 25 MG OR CAPS
50.00 mg | ORAL_CAPSULE | Freq: Four times a day (QID) | ORAL | 3 refills | Status: AC | PRN
Start: 2018-10-17 — End: ?

## 2018-10-17 MED ORDER — PREDNISONE 20 MG OR TABS
40.00 mg | ORAL_TABLET | Freq: Every day | ORAL | 0 refills | Status: DC
Start: 2018-10-17 — End: 2018-10-21

## 2018-10-17 NOTE — Telephone Encounter (Signed)
S/w pt who states that [facility] is requiring pt get prednisone for MRI w/ Contrast. If it's not prescribed it will have to be an MRI w/out contrast. Please advise Thank you.      predniSONE (DELTASONE) 53m  1 pill for 12 hrs before MRI & 1 pill 2 hrs before MRI    [facility] [facility] stating that [facility] would like a call if we do prescribe medication   Ph. [phone]

## 2018-10-17 NOTE — Telephone Encounter (Signed)
Pt is requesting these meds to take prior to MRI  I'm assuming for prior allergic reaction to contrast in the MRI according to the notes.    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 predniSONE (DELTASONE) 50 MG tablet  0     Sig: Take 1 tablet (50 mg) by mouth daily.   . diphenhydrAMINE (BENADRYL) 25 MG capsule 30 capsule      Sig: Take 2 capsules (50 mg) by mouth every 6 hours as needed for Itching.     Signed Prescriptions Disp Refills   . ondansetron (ZOFRAN) 8 MG tablet 15 tablet 1     Sig: Take 1 tablet (8 mg) by mouth every 8 hours as needed for Nausea/Vomiting.     Authorizing Provider: [employer]       Send to:     [facility] # [place], [place]  [address]  [place] [address]  Phone: [phone] Fax: [place] Team   Ext: [phone]

## 2018-10-17 NOTE — Telephone Encounter (Signed)
Pt called again because she forgot to mention that she needs something for her anxiety during MRI. Inquiring if she can take OTC benadryl or if something can be prescribed.

## 2018-10-18 NOTE — Telephone Encounter (Signed)
Pt is calling because she was advised by [facility] that she needs to take prednisone 1 tablet 20m 12 hour prior and 1 tablet 450m2 hours prior.   Current order is for 2048mablets.     [facility] [place] [facility] scheduled for 9/11 at 4:00pm

## 2018-10-21 MED ORDER — PREDNISONE 50 MG OR TABS
50.00 mg | ORAL_TABLET | Freq: Every day | ORAL | 0 refills | Status: DC
Start: 2018-10-21 — End: 2019-03-26

## 2018-10-21 NOTE — Telephone Encounter (Addendum)
Pt called back stating that she was on hold for a while waiting to get contacted w/ RN but ended up figuring their problem w/ daughter. Says she hung up on purpose. Says thanks for the help but they got it.

## 2018-10-21 NOTE — Telephone Encounter (Signed)
Per Casimer Bilis, NP:  50 mg prednisone po 13 hrs prior   Then   50 mg prednisone PO 7 hrs prior   Then   50 mg prednisone PO 1 hour prior to scan   Rx changed and sent to [facility]     Left message for patient to return call to this RN to review medications.

## 2018-10-21 NOTE — Telephone Encounter (Signed)
Reviewed contrast allergy instructions with patient.  Pt will have imaging at [facility] (near her home)  as she does not have transportation to come to [place].  Pt states she was instructed by [facility] scheduler regarding prednisone instructions.   Pt advised to take prednisone 40 mg 12 hrs prior and 40 mg 2 hrs prior.    Pt has hx of DM diet controlled and mitral valve prolapse.   Pt states she had a severe reaction (SOB and facial hives) many years ago.  Pt states she has had several imaging studies since and only had benadryl without any reactions by taking Benadryl 50 mg HS, Benadryl 50 mg 1 hr prior to exam.    Advised patient would recommend following prednisone instructions as directed by R. Irene Pap, NP.  Expressed concern that she may want to consider having imaging exam at hospital based location due to hx of contrast allergy.     Provider:  Patient inquires if she needs to have prednisone vs Benadryl as she is concerned her BS will go up.  Patient inquires which dosing regimen she should follow.    Advised will discuss with R. Irene Pap, NP .

## 2018-10-22 NOTE — Telephone Encounter (Signed)
Per Casimer Bilis, NP:    Our pharmacy protocol Merry Proud pulled it up for me has that dosage.   The short burst may up her sugar for a bit but no long term issues.     Left detailed message for patient with R. Soto, NP's recommendations.  Advised to return call with any questions.

## 2018-10-30 ENCOUNTER — Telehealth (HOSPITAL_BASED_OUTPATIENT_CLINIC_OR_DEPARTMENT_OTHER): Payer: Self-pay | Admitting: Hepatology

## 2018-10-30 NOTE — Telephone Encounter (Signed)
Received a call from [facility] regarding this patient  They called me to let me know that she has 9/11 MRI but apparently does not have auth and they called my cell phone to ask for the [place]. Can you please help with this? Here is the number: [phone]   This is the same order that I placed in January.

## 2018-10-31 NOTE — Telephone Encounter (Signed)
Called [facility] and s/w [place] Ph. [phone]. Notified her to please change Dr. Derry Lory cell number as a form of contact and add [facility] phone number instead. Provided her with phone number to call with for any concerns. She updated information.     In addition, I notified that MRI does not need authorization as patient has Medicare B and BCP Medi-Cal. She verified insurance on her end and verbalized "no Josem Kaufmann is needed" apologized for inconvenience.

## 2018-10-31 NOTE — Telephone Encounter (Signed)
Per Dr. Marykay Lex:  Received a call from [facility] regarding this patient  They called me to let me know that she has 9/11 MRI but apparently does not have auth and they called my cell phone to ask for the [place]. Can you please help with this? Here is the number: [phone]   This is the same order that I placed in January    Call Team:   Please see tele encounter dated 03/15/18 with MRI/auth request for [facility].   [facility] is requesting auth at this time.  Please assist patient with order/auth to pts preferred Imaging location.

## 2018-11-01 ENCOUNTER — Other Ambulatory Visit: Payer: Self-pay

## 2018-11-04 NOTE — Telephone Encounter (Signed)
S/w pt who states that she received MRI on 9/11 at [facility] w/ steroids.    Still has pain on side that comes & goes. The more active she feels wanting to come back, heating pad not working. Pain for 2-3 months. Scared to know MRI results due to thinking it could be something bad

## 2018-11-04 NOTE — Telephone Encounter (Signed)
Routing to Dr.Vodkin for review    Call transferred to this RN. Patient states she wanted to let Dr.Vodkin know she completed her MRI on 9/11 at [facility].     States she is still experiencing the same intermittent abdominal pain that she has had for the last 2-3 months. Notices activity exacerbates her pain. Heating pad and salon pas helps decrease pain. She is anxious to know MRI results and see if there is a cause for her continued abdominal pain.

## 2018-11-05 ENCOUNTER — Other Ambulatory Visit: Payer: Self-pay

## 2018-11-05 NOTE — Telephone Encounter (Signed)
Call Team:  Please request imaging from [facility].

## 2018-11-05 NOTE — Telephone Encounter (Signed)
Sent request to [facility] for MRI 11/01/18 fax: [phone]

## 2018-11-05 NOTE — Telephone Encounter (Signed)
Routing to MA's to assist.    Please request MRI (11/01/18) from [facility], once received please advise so Dr.Vodkin can review.

## 2018-11-06 NOTE — Telephone Encounter (Signed)
11/01/18 Imaging received and routed to provider for review.

## 2018-11-08 NOTE — Telephone Encounter (Signed)
Please advise patient her appointment w/ Robin on 9/30 is to discuss the MRI results and POC.

## 2018-11-08 NOTE — Telephone Encounter (Signed)
Called and spoke with patient and scheduled appointment for 11/20/2018 at 2:00 pm with NP Wetzel Bjornstad.    Patient would like for someone to call her to discuss imaging results. Ph. [phone]

## 2018-11-12 NOTE — Telephone Encounter (Signed)
Pt calling to get results, explained that they will be given during her 9/30 appt. Pt understood.

## 2018-11-19 NOTE — Progress Notes (Signed)
November 20, 2018    Location: [facility], Prudence Davidson     Hepatologist: Marlowe Kays, MD    Visit Provider: Wetzel Bjornstad, NP     Chief Complaint   Patient presents with   . 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 Last seen in Hepatology by NP Laurens Matheny on 03/14/2018. Pt is here today with her son  Pt states she has had chronic pain radiating from mid epigastrim to RUQ and to R CVA area worse with pressing on area, lifting arms and also states worsened once started pain meds pt has AM nausea, denies vomiting, diarrhea, + constipation needs laxitive and miralax to hav BM states painful to pass gas. Rates pain at times 10/10  states is stabbing in quality especially when she walks around she she carries a pillow against her side which helps with pain also using Lidoderm patch and pain meds. Endorses that last had labs drawn at [facility] in [place] 02/2018 they reveal alc phos 139 all other labs wnl.  Pt  Had MRI of liver at OSH in September, imaging results not available during office visit but per Dr. Derry Lory notes cyst has recurred and is about 5 cm, she is concerned if it is drained it will likely recur.  Pt denies ascites, edema, hematemesis, hematochezia, denies melena, jaundice, pruritis or acholic stools, denies hepatic encephalopathy (no overt encephalopathy, no reversal of sleep/wake cycles, no memory/attention issues). She has chronic pain from RA and. Her weight is the same as her visit 6 months ago.     Patient Active Problem List    Diagnosis Date Noted   . S/P mitral valve  repair 12/27/2016   . Mitral stenosis 12/26/2016   . Pulmonary HTN (CMS-HCC) 11/16/2016     Added automatically from request for surgery [phone]     . NASH (nonalcoholic steatohepatitis) 08/30/2016   . Liver cyst 08/30/2016   . BMI 27.0-27.9,adult 08/30/2016   . Abdominal pain 10/10/2014   . Liver mass 10/10/2014     11/25/15 MRI Abd W/WO Contrast ([facility]): Stable appearence of a cystic lesion in the right lobe of the liver near the hepatic dome measuring 3.9 x 6.1 6.5 cm. 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 However, a hemorrhagic cyst remains in the differential . No aggressive/malignant features are noted. However, continued follow-up is recommended to assess stability.  Stable appearence of several additional small simple cysts within the liver.   Previous cholecystectomy.    11/24/14 MRI Abd W/WO Contrast ([facility]): A well-circumscribed T1 hyperintense cystic lesion in the right hepatic lobe measures 7.6 x 7.7 cm (20-10) with nonenhancing nodularity seen inferiorly and with a single internal septation. This lesion is similar in size and appearance to the MRI examination perfolrmed on 11/09/2011. Similar smaller cystic lesions are seen at the periphery of hepatic segments six and seven. All of these lesions are unchanged from prior.     10/12/11 [place] Abd ([facility]): Hepatic steatosis. Septated right  hepatic cyst. Hypervascular possibly solid lesion in the right lobe. Depending on clinical considerations, further evaluation with pre-and post contrast enhanced MR and CT may be of benefit for further evaluation of these lesions. Cholecystectomy.      06/09/2015 Liver Bx [facility]): Steatohepatitis with periportal and focal septal fibrosis(stage 2-3 of 4). Small focus of benign epithelial lining suggestive of benign cyst, with adjacent chronic inflammation, reactive fibrosis, and hemosiderin depostition B. Liver asparte fluid: hypocellular specimen. Rare benign  hepatocysts and macrophages. No malignant cells identified.     . Hypertension 10/10/2014   . Hypothyroidism 10/10/2014   . Fibromyalgia 10/08/2009         Past Medical History:   Diagnosis Date   . Back pain     Needs L4 fusion   . Depression    . Diabetes (CMS-HCC)    . Fibromyalgia    . GERD (gastroesophageal reflux disease)    . Glaucoma    . Hypercholesterolemia    . Hypertension    . Hypothyroidism    . Migraines    . PAF (paroxysmal atrial fibrillation) (CMS-HCC)         Past Surgical History:   Procedure Laterality Date   . MITRAL VALVE REPAIR  12/26/2016    TMVR   . LIVER BIOPSY  05/22/2015    steatohepatitis with periportal and focal septal fibrosis(stage 2-3 of 4). Small focus of benign epithelial lining suggestive of benign cyst, with adjacent chronic inflammation, reactive fibrosis, and hemosiderin depostition B. Liver asparte fluid: hypocellular specimen. Rare benign hepatocysts and macrophages. No malignant cells identified.   . ESOPHAGOGASTRODUODENOSCOPY  03/11/2015   . COLONOSCOPY  05/31/2012    normal mucosa with internal hemorrhoids.    Marland Kitchen BILE DUCT STENT PLACEMENT     . CHOLECYSTECTOMY     . HYSTERECTOMY     . TONSILLECTOMY AND ADENOIDECTOMY         Family History   Problem Relation Name Age of Onset   . Stroke Mother     . Heart Disease Mother     . Stroke Father     . Heart Disease Father         Social History     Socioeconomic History   . Marital status: Married     Spouse name: Not on file   . Number of children: Not on file   . Years of education: Not on file   . Highest education level: Not on file   Occupational History   . Not on file   Social Needs   . Financial resource strain: Not on file   . Food insecurity     Worry: Not on file     Inability: Not on file   . Transportation needs     Medical: Not on file     Non-medical: Not on file   Tobacco Use   . Smoking status: Never Smoker   . Smokeless tobacco: Never Used   Substance and Sexual Activity   . Alcohol use: No   . Drug use: No   .  Sexual activity: Not on file   Lifestyle   . Physical activity     Days per week: Not on file     Minutes per session: Not on file   . Stress: Not on file   Relationships   . Social Product manager on phone: Not on file     Gets together: Not on file  Attends religious service: Not on file     Active member of club or organization: Not on file     Attends meetings of clubs or organizations: Not on file     Relationship status: Not on file   . Intimate partner violence     Fear of current or ex partner: Not on file     Emotionally abused: Not on file     Physically abused: Not on file     Forced sexual activity: Not on file   Other Topics Concern   . Caffeine Concern Not Asked   Social History Narrative   . Not on file       Allergies   Allergen Reactions   . Ceftriaxone Rash   . Iodine Rash   . Iv Contrast [Contrast Media] Anaphylaxis     Itching swelling unable to breathe     . Latex Rash   . Metformin Rash   . Ativan [Lorazepam] Other     Heart palpitations     . Januvia [Sitagliptin] Other     Sob, chest pain, heart palpitations     . Macrobid [Nitrofurantoin] Unspecified   . Tylenol [Acetaminophen] Other     bc of the lesion of the liver.       Current Outpatient Medications on File Prior to Visit   Medication Sig Dispense Refill   . aspirin 81 MG tablet Take 81 mg by mouth daily.     Marland Kitchen atorvastatin (LIPITOR) 10 MG tablet Take 10 mg by mouth daily.     . Buprenorphine HCl (BELBUCA) 150 MCG FILM Suck 150 mcg in mouth 2 times daily.     . carvedilol (COREG) 25 MG tablet Take 25 mg by mouth 2 times daily (with meals).     . Cholecalciferol (VITAMIN D PO) Take 50,000 capsules by mouth once a week.       . clopidogrel (PLAVIX) 75 MG tablet Take 75 mg by mouth daily.     . cyclobenzaprine (FLEXERIL) 10 MG tablet      . diphenhydrAMINE (BENADRYL) 25 MG capsule Take 2 capsules (50 mg) by mouth every 6 hours as needed for Itching. 30 capsule 3   . furosemide (LASIX) 20 MG tablet Take 20 mg by mouth as needed.      Marland Kitchen HYDROmorphone (DILAUDID) 2 MG tablet Take 2 mg by mouth every 4 hours as needed for Moderate Pain (Pain Score 4-6).     Marland Kitchen latanoprost (XALATAN) 0.005 % ophthalmic solution Place 1 drop into both eyes every evening.     Marland Kitchen levothyroxine (SYNTHROID) 100 MCG tablet Take 112 mcg by mouth daily.       Marland Kitchen lidocaine (XYLOCAINE) 5 % ointment      . MOVANTIK 25 MG TABS tablet      . ondansetron (ZOFRAN) 8 MG tablet Take 1 tablet (8 mg) by mouth every 8 hours as needed for Nausea/Vomiting. 15 tablet 1   . pantoprazole (PROTONIX) 40 MG tablet Take 40 mg by mouth daily.     . predniSONE (DELTASONE) 50 MG tablet Take 1 tablet (50 mg) by mouth daily. Take 1 tablet by mouth 13 hours prior to scan then 1 tablet 7 hours prior to scan then 1 tablet 1 hour prior to the scan 3 tablet 0   . zolpidem (AMBIEN) 5 MG tablet Take 5 mg by mouth nightly as needed for Insomnia.       No current facility-administered medications on file prior to visit.  Review of Systems -  Constitutional: no fever, chills or weight gain  Eyes: no vision changes  HENT: no headaches, nose bleeds or oral lesions  Cardiovascular: no chest pain or peripheral edema  Respiratory: no SOB, dyspnea or hemoptysis  Gastrointestinal: no hematemesis, hematochezia or melena. No abdominal distention  Genitourinary: no dysuria  Musculoskeletal: no pain or swelling of muscles or joints  Integumentary: no rashes or jaundice  Neurological: no tremors or difficulties with memory  Psychiatric: Negative  Endocrine: no cold/heat intolerance, no excessive sweating or polyuria  Hematologic/Lymphatic: no easy bruising or bleeding  Allergic/Immunologic: negative, see allergy list  12 point ROS is otherwise negative    Liver Specific Review of Systems-  Confusion: No  Abdominal distention: No  Melena: No  Hematemesis: No  Hematochezia: No  Lower extremity edema: No  Jaundice: No        Physical Exam: (9 systems examined)     Vital Signs: BP 120/79 (BP Location: Left arm, BP  Patient Position: Sitting, BP cuff size: Regular)   Pulse 85   Temp 97.9 F (36.6 C) (Temporal)   Resp 18   Ht 5' 3"  (1.6 m)   Wt 80.6 kg (177 lb 9.6 oz)   SpO2 96%   BMI 31.46 kg/m      BMI: Body mass index is 31.46 kg/m.     Systems:  GEN: appears well, in no apparent distress. Alert and oriented times three, pleasant and cooperative.  HEENT: no sclera icterus, O/P: mmm no lesions  Lymph nodes:  negative lymph nodes  Pulmonary: CTAB, no wheezes, good air movement  Cardiovascular: regular rate & rhythm,  no murmurs, gallops or rubs, no peripheral edema  Gastrointestinal: BS x4, soft NTND, liver and spleen not palpable  Rectal and genitalia: Not examined  Neurologic including mental status: Alert & oriented x3. No encephalopathy or asterixis  Dermatologic: no jaundice, rash, or spider angiomata  Musculoskeletal: Normal       Laboratory:   Results for orders placed or performed in visit on 03/15/18   Comprehensive Metabolic Panel Green   Result Value Ref Range    Glucose 162     BUN 11     Creatinine 0.73     Sodium 138     Potassium 3.9     Chloride 100     Bicarbonate 24     Calcium 9.6     Total Protein 7.2     Albumin 4.6     Globulin 2.6     Albumin/Globulin Ratio 1.8     Bilirubin, Total 0.5     Alkaline Phos 116     AST (SGOT) 21     ALT (SGPT) 19     eGFR non-Afr.American 87     eGFR African American 101     Antinuclear Antibodies, IFA Negative     C Reactive Protein 2.3     CCP Antibodies IgG/IgA >250    CBC w/ Diff Lavender   Result Value Ref Range    WBC 7.3     RBC 4.58     Hgb 13.9     Hct 40.8     MCV 89     MCH 30.3     MCHC 34.1     RDW 14.2     MPV      Plt Count 263     Segs      Segs 66     Lymphocytes 20     Monocytes 10  Eosinophils 3     Basophils 1    Lipid Panel Green Plasma Separator Tube   Result Value Ref Range    Cholesterol 149     HDL Cholesterol 46     LDL Cholesterol 70     Triglycerides 167         Endoscopy:      Pathology:      Imaging:   06/09/2017  MRI/MRE  Redemonstration of a 6.8 cm T1 hyperintense lesion at the liver dome which is suboptimally evaluated given artifact at the liver dome, but is unchanged in size and morphology compared to 2017. Given the artifact, the liver dome parenchyma is incompletely assessed.  Repeat MRI with contrast is recommended for further evaluation, at no additional charge to the patient. At the time of the repeat MRI an addendum can be issued to this report.  2. Moderate to severe hepatic steatosis 15-26%.  3. Estimated stiffness: mean 2.6 kPa, no detectable degree of fibrosis.    I have reviewed all lab documents in media and imaging and procedures in EPIC    Assessment and Plan:  Cathy Lucas is a 68 year old female with a past medical history ofRA.DM, HTN, HLD, as well as known hepatic cyst (dx around 2006 per notes but patient said actually late 90s, has had bleed into cyst 2016and thinks maybe again 2017), here for follow up for liver cyst and biopsy proven NASH.     Liver Cystlong standing.  - Per Dr. 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 minimal growth last several years. Able to get prior images but all occurred after suspected bleed.   -- if this lesion needs to be treated, likely needs surgery > IR.  Cyst location is not ideal at the dome and patient is anticoagulated with multiple comorbidities. Would only pursue rx if truly felt to have malignant potential.   - Her abd pain seems less likely due to this an more likely other GI process or musculoskeletal pain  - advised to contact her community GI Dr. Vernard Gambles to w/u abd pain and GI symptoms of painful gas and constipation    NASH: biopsy proven NASH with fibrosis  -- labs improved with weight loss. MRE with pka 2.6 which is better than  the staging on her initial biopsy.   - labs drawn 03/12/2018 from [facility] normal except slight elevation in Alk P  -- Risk modification:   1. Current BMI: stable Discussed diet/excercise. Given issues with joint pain she can consider swimming. Needs to see nutrition. She will talk to her endocrinologist and her PCP  2. HLD: management per primary care physician. No contra-indication to statins in this population.   3. Diabetes: management per primary care physician. a1c 6.6. She is not on medications.      4. Her rheumatologist is considering putting her back on steroids. This is not ideal in the setting of her diabetes, nash can cardiac disease. She would like to get a second opinion. Have referred to [facility].   -- Medications:none at this time. Doubt would be candidate for clinical trial with numerous comorbidities and she is not interested in any additional meds. I don't think she is a great candidate for pioglitazone with  cardiac issues and hypoglycemia episodes. Vit E can be considered but enzymes normal and fibrosis minimal on MRE.     Rheumatoid arthritis  - referring patient to rheumatology clinic to discuss restarting medications for RA, specifically steroid-sparing medications if possible given patient's concurrent metabolic syndrome and NASH    HCM:   - HAV: hepatitis A antibody ordered  - HBV: not immune   - flu vaccine today     Follow up in hepatology in 3-4 months      Wetzel Bjornstad, MSN FNP-BC  Hepatology Nurse Practitioner  [place] [facility]

## 2018-11-20 ENCOUNTER — Encounter (INDEPENDENT_AMBULATORY_CARE_PROVIDER_SITE_OTHER): Payer: Self-pay | Admitting: Nurse Practitioner

## 2018-11-20 ENCOUNTER — Other Ambulatory Visit (INDEPENDENT_AMBULATORY_CARE_PROVIDER_SITE_OTHER): Payer: Medicare Other | Attending: Nurse Practitioner

## 2018-11-20 ENCOUNTER — Ambulatory Visit (INDEPENDENT_AMBULATORY_CARE_PROVIDER_SITE_OTHER): Payer: Medicare Other | Admitting: Nurse Practitioner

## 2018-11-20 VITALS — BP 120/79 | HR 85 | Temp 97.9°F | Resp 18 | Ht 63.0 in | Wt 177.6 lb

## 2018-11-20 DIAGNOSIS — Z23 Encounter for immunization: Secondary | ICD-10-CM

## 2018-11-20 DIAGNOSIS — K7581 Nonalcoholic steatohepatitis (NASH): Secondary | ICD-10-CM | POA: Insufficient documentation

## 2018-11-20 DIAGNOSIS — R160 Hepatomegaly, not elsewhere classified: Secondary | ICD-10-CM

## 2018-11-20 DIAGNOSIS — R1011 Right upper quadrant pain: Secondary | ICD-10-CM

## 2018-11-20 LAB — CBC WITH DIFF, BLOOD
ANC-Automated: 4.1 10*3/uL (ref 1.6–7.0)
Abs Basophils: 0.1 10*3/uL
Abs Eosinophils: 0.2 10*3/uL (ref 0.0–0.5)
Abs Lymphs: 1.6 10*3/uL (ref 0.8–3.1)
Abs Monos: 0.6 10*3/uL (ref 0.2–0.8)
Basophils: 1 %
Eosinophils: 3 %
Hct: 43 % (ref 34.0–45.0)
Hgb: 13.9 gm/dL (ref 11.2–15.7)
Lymphocytes: 25 %
MCH: 29.5 pg (ref 26.0–32.0)
MCHC: 32.3 g/dL (ref 32.0–36.0)
MCV: 91.3 um3 (ref 79.0–95.0)
MPV: 11.4 fL (ref 9.4–12.4)
Monocytes: 9 %
Plt Count: 223 10*3/uL (ref 140–370)
RBC: 4.71 10*6/uL (ref 3.90–5.20)
RDW: 13.2 % (ref 12.0–14.0)
Segs: 62 %
WBC: 6.5 10*3/uL (ref 4.0–10.0)

## 2018-11-20 LAB — COMPREHENSIVE METABOLIC PANEL, BLOOD
ALT (SGPT): 12 U/L (ref 0–33)
AST (SGOT): 20 U/L (ref 0–32)
Albumin: 4.5 g/dL (ref 3.5–5.2)
Alkaline Phos: 126 U/L (ref 35–140)
Anion Gap: 13 mmol/L (ref 7–15)
BUN: 17 mg/dL (ref 8–23)
Bicarbonate: 24 mmol/L (ref 22–29)
Bilirubin, Tot: 0.36 mg/dL (ref <1.2)
Calcium: 9.4 mg/dL (ref 8.5–10.6)
Chloride: 100 mmol/L (ref 98–107)
Creatinine: 0.72 mg/dL (ref 0.51–0.95)
GFR: >60 mL/min
Glucose: 266 mg/dL — ABNORMAL HIGH (ref 70–99)
Potassium: 4.2 mmol/L (ref 3.5–5.1)
Sodium: 137 mmol/L (ref 136–145)
Total Protein: 7.5 g/dL (ref 6.0–8.0)

## 2018-11-20 LAB — PROTHROMBIN TIME, BLOOD
INR: 1.1
PT,Patient: 11.7 s (ref 9.7–12.5)

## 2018-11-20 LAB — BILIRUBIN, DIR BLOOD: Bilirubin, Dir: <0.2 mg/dL (ref <0.2)

## 2018-11-20 NOTE — Patient Instructions (Signed)
1. Recommend follow up with Dr. Link Snuffer for ABD pain symptoms  2. Continue to eat a healthy diet  3. We will have you get labs today   4. MRI showed re-occurrence of liver cyst but now smaller than prior   5. Continue pain management   6. Follow up with Dr. Marykay Lex  Thank you for allowing me to participate in your care today.    As a reminder, please do not go to the lab without confirming you have lab orders in the computer. You may call the lab at [phone] to confirm.     Please do not hesitate to call your Care Team at [phone] if you have any questions or concerns.    Marlowe Kays, M.D.  Hepatologist    Wetzel Bjornstad, MSN, FNP-BC  Hepatology Nurse Practitioner    Rhys Martini, RN refill medications or medical questions  [place]: [phone]      Appointments, procedures or insurance issues  [place] [facility] call center  [employer] [phone]

## 2018-11-20 NOTE — Interdisciplinary (Signed)
Son accompanying patient in lobby for appointment today with NP Soto. He was very adamant to attend today's appointment with NP Soto. He stated that there is a need to be present for the appointment due to "stuff that is happening behind the scene." Advised patients son that I will need to bring the patient (his mother) back first and check with NP Irene Pap if it is okay for son to come back. Advised Gabe at the front desk to bring son back once I completed check-in and rooming.    NO Soto aware that son is in room with patient.

## 2018-11-21 ENCOUNTER — Telehealth (HOSPITAL_BASED_OUTPATIENT_CLINIC_OR_DEPARTMENT_OTHER): Payer: Self-pay | Admitting: Nurse Practitioner

## 2018-11-21 NOTE — Telephone Encounter (Signed)
Pt states she [facility]/2018    Ph. [phone]  Fax: [phone]

## 2018-11-21 NOTE — Telephone Encounter (Signed)
ROI faxed to [facility] for MRI     [place] Team   Ext: [phone]

## 2018-11-21 NOTE — Telephone Encounter (Signed)
Left VM for pt to call back need to know what facility she went to for her most recent MRI .  If she calls back please request that info and route back to me to request the records.    Thank you.  [facility] MA/HA2  Hepatology Care Team   Ext: [phone]

## 2018-12-03 NOTE — Telephone Encounter (Signed)
11/01/18 MRI report in imaging tab was sent via [facility]bra from [facility].    [facility] MA/HA2  Hepatology Care Team   Ext: [phone]

## 2019-03-26 ENCOUNTER — Encounter (INDEPENDENT_AMBULATORY_CARE_PROVIDER_SITE_OTHER): Payer: Self-pay | Admitting: Hepatology

## 2019-03-26 ENCOUNTER — Ambulatory Visit (INDEPENDENT_AMBULATORY_CARE_PROVIDER_SITE_OTHER): Payer: Medicare Other | Admitting: Hepatology

## 2019-03-26 VITALS — BP 161/84 | HR 79 | Temp 96.9°F | Ht 64.5 in | Wt 177.1 lb

## 2019-03-26 DIAGNOSIS — K7689 Other specified diseases of liver: Secondary | ICD-10-CM

## 2019-03-26 DIAGNOSIS — K760 Fatty (change of) liver, not elsewhere classified: Secondary | ICD-10-CM

## 2019-03-26 MED ORDER — AMOXICILLIN 500 MG OR CAPS
ORAL_CAPSULE | ORAL | Status: DC
Start: 2019-01-13 — End: 2019-09-03

## 2019-03-26 MED ORDER — SULFAMETHOXAZOLE-TRIMETHOPRIM 800-160 MG OR TABS
ORAL_TABLET | ORAL | Status: DC
Start: 2019-03-03 — End: 2019-03-26

## 2019-03-26 MED ORDER — STERILANCE TL MISC
Status: AC
Start: 2017-09-22 — End: ?

## 2019-03-26 MED ORDER — VITAMIN D (ERGOCALCIFEROL) 1.25 MG (50000 UT) PO CAPS
ORAL_CAPSULE | ORAL | Status: DC
Start: 2018-06-05 — End: 2020-03-22

## 2019-03-26 MED ORDER — TRUE METRIX BLOOD GLUCOSE TEST VI STRP
ORAL_STRIP | Status: AC
Start: 2018-01-15 — End: ?

## 2019-03-26 NOTE — Progress Notes (Signed)
March 26, 2019    Location: [facility], Prudence Davidson     Hepatologist: Marlowe Kays, MD    Visit Provider: Riley Nearing, MD     No chief complaint on file.    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     Today   - Abd pain went away on its own 1 mo ago. Intermittent now, used to constant. Spicy, fatty foods seem to cause  - Pain med dose has not changed.   - Dr. Link Snuffer scheduled EGD for abd pain, N/V but did not go d/t COVID on around mid Dec  - Feels nauseous intermittently   - Saw Dr. Latricia Heft (rheumatology) - prescribed hydrochloroquine, lefluonide 2 yrs ago. Stopped taking it 1 yr ago as it felt like it wasn't helping. She was then lost to follow up  - RA is worse in all of her joints.   - Weight is stable from last time 177lbs   - Diet - limiting carbs, sugars,   - Exercise - Started treadmill 15-20 min each time  - Last a1c 7.9, glucose 11/20/18 was 266. Needs PCP as last primary care doctor left    LCV 11/20/18    "Last seen in Hepatology by NP Soto on 03/14/2018. Pt is here today with her son  Pt states she has had chronic pain radiating from mid epigastrim to RUQ and to R CVA area worse with pressing on area, lifting arms and also states worsened once started pain meds pt has AM nausea, denies vomiting, diarrhea, + constipation needs laxitive and miralax to hav BM states painful to pass gas. Rates pain at times 10/10  states is stabbing in quality especially when she walks around she she carries a pillow against her side which helps with pain also using Lidoderm patch and pain meds. Endorses  that last had labs drawn at [facility] in [place] 02/2018 they reveal alc phos 139 all other labs wnl.  Pt  Had MRI of liver at OSH in September, imaging results not available during office visit but per Dr. Derry Lory notes cyst has recurred and is about 5 cm, she is concerned if it is drained it will likely recur.  Pt denies ascites, edema, hematemesis, hematochezia, denies melena, jaundice, pruritis or acholic stools, denies hepatic encephalopathy (no overt encephalopathy, no reversal of sleep/wake cycles, no memory/attention issues). She has chronic pain from RA and. Her weight is the same as her visit 6 months ago."      Patient Active Problem List    Diagnosis Date Noted   . S/P mitral valve repair 12/27/2016   . Mitral stenosis 12/26/2016   . Pulmonary HTN (CMS-HCC) 11/16/2016     Added automatically from request for surgery [id number]     . NASH (nonalcoholic steatohepatitis) 08/30/2016   . Liver cyst 08/30/2016   . BMI 27.0-27.9,adult 08/30/2016   . Abdominal pain 10/10/2014   . Liver mass 10/10/2014     11/25/15 MRI Abd W/WO Contrast ([facility]): Stable appearence of a cystic lesion in the right  lobe of the liver near the hepatic dome measuring 3.9 x 6.1 6.5 cm. 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 However, a hemorrhagic cyst remains in the differential . No aggressive/malignant features are noted. However, continued follow-up is recommended to assess stability.  Stable appearence of several additional small simple cysts within the liver.   Previous cholecystectomy.    11/24/14 MRI Abd W/WO Contrast ([facility]): A well-circumscribed T1 hyperintense cystic lesion in the right hepatic lobe measures 7.6 x 7.7 cm (20-10) with nonenhancing nodularity seen inferiorly and with a single internal septation. This lesion is similar in size and appearance to the MRI examination perfolrmed on 11/09/2011. Similar smaller cystic lesions are seen at the periphery of hepatic segments six and seven. All of  these lesions are unchanged from prior.     10/12/11 [place] Abd ([facility]): Hepatic steatosis. Septated right hepatic cyst. Hypervascular possibly solid lesion in the right lobe. Depending on clinical considerations, further evaluation with pre-and post contrast enhanced MR and CT may be of benefit for further evaluation of these lesions. Cholecystectomy.      06/09/2015 Liver Bx [facility]): Steatohepatitis with periportal and focal septal fibrosis(stage 2-3 of 4). Small focus of benign epithelial lining suggestive of benign cyst, with adjacent chronic inflammation, reactive fibrosis, and hemosiderin depostition B. Liver asparte fluid: hypocellular specimen. Rare benign hepatocysts and macrophages. No malignant cells identified.     . Hypertension 10/10/2014   . Hypothyroidism 10/10/2014   . Fibromyalgia 10/08/2009         Past Medical History:   Diagnosis Date   . Back pain     Needs L4 fusion   . Depression    . Diabetes (CMS-HCC)    . Fibromyalgia    . GERD (gastroesophageal reflux disease)    . Glaucoma    . Hypercholesterolemia    . Hypertension    . Hypothyroidism    . Migraines    . PAF (paroxysmal atrial fibrillation) (CMS-HCC)         Past Surgical History:   Procedure Laterality Date   . MITRAL VALVE REPAIR  12/26/2016    TMVR   . LIVER BIOPSY  05/22/2015    steatohepatitis with periportal and focal septal fibrosis(stage 2-3 of 4). Small focus of benign epithelial lining suggestive of benign cyst, with adjacent chronic inflammation, reactive fibrosis, and hemosiderin depostition B. Liver asparte fluid: hypocellular specimen. Rare benign hepatocysts and macrophages. No malignant cells identified.   . ESOPHAGOGASTRODUODENOSCOPY  03/11/2015   . COLONOSCOPY  05/31/2012    normal mucosa with internal hemorrhoids.    Marland Kitchen BILE DUCT STENT PLACEMENT     . CHOLECYSTECTOMY     . HYSTERECTOMY     . TONSILLECTOMY AND ADENOIDECTOMY         Family History   Problem Relation Name Age of Onset   . Stroke  Mother     . Heart Disease Mother     . Stroke Father     . Heart Disease Father         Social History     Socioeconomic History   . Marital status: Married     Spouse name: Not on file   . Number of children: Not on file   . Years of education: Not on file   . Highest education level: Not on file   Occupational History   . Not on file   Social Needs   . Financial resource strain:  Not on file   . Food insecurity     Worry: Not on file     Inability: Not on file   . Transportation needs     Medical: Not on file     Non-medical: Not on file   Tobacco Use   . Smoking status: Never Smoker   . Smokeless tobacco: Never Used   Substance and Sexual Activity   . Alcohol use: No   . Drug use: No   . Sexual activity: Not on file   Lifestyle   . Physical activity     Days per week: Not on file     Minutes per session: Not on file   . Stress: Not on file   Relationships   . Social Product manager on phone: Not on file     Gets together: Not on file     Attends religious service: Not on file     Active member of club or organization: Not on file     Attends meetings of clubs or organizations: Not on file     Relationship status: Not on file   . Intimate partner violence     Fear of current or ex partner: Not on file     Emotionally abused: Not on file     Physically abused: Not on file     Forced sexual activity: Not on file   Other Topics Concern   . Caffeine Concern Not Asked   Social History Narrative   . Not on file       Allergies   Allergen Reactions   . Ceftriaxone Rash   . Iodine Rash   . Iv Contrast [Contrast Media] Anaphylaxis     Itching swelling unable to breathe     . Latex Rash   . Metformin Rash   . Ativan [Lorazepam] Other     Heart palpitations     . Januvia [Sitagliptin] Other     Sob, chest pain, heart palpitations     . Macrobid [Nitrofurantoin] Unspecified   . Tylenol [Acetaminophen] Other     bc of the lesion of the liver.       Current Outpatient Medications on File Prior to Visit   Medication Sig  Dispense Refill   . aspirin 81 MG tablet Take 81 mg by mouth daily.     Marland Kitchen atorvastatin (LIPITOR) 10 MG tablet Take 10 mg by mouth daily.     . Buprenorphine HCl (BELBUCA) 150 MCG FILM Suck 150 mcg in mouth 2 times daily.     . carvedilol (COREG) 25 MG tablet Take 25 mg by mouth 2 times daily (with meals).     . Cholecalciferol (VITAMIN D PO) Take 50,000 capsules by mouth once a week.       . clopidogrel (PLAVIX) 75 MG tablet Take 75 mg by mouth daily.     . cyclobenzaprine (FLEXERIL) 10 MG tablet      . diphenhydrAMINE (BENADRYL) 25 MG capsule Take 2 capsules (50 mg) by mouth every 6 hours as needed for Itching. 30 capsule 3   . furosemide (LASIX) 20 MG tablet Take 20 mg by mouth as needed.     Marland Kitchen HYDROmorphone (DILAUDID) 2 MG tablet Take 2 mg by mouth every 4 hours as needed for Moderate Pain (Pain Score 4-6).     Marland Kitchen latanoprost (XALATAN) 0.005 % ophthalmic solution Place 1 drop into both eyes every evening.     Marland Kitchen levothyroxine (SYNTHROID) 100  MCG tablet Take 112 mcg by mouth daily.       Marland Kitchen lidocaine (XYLOCAINE) 5 % ointment      . MOVANTIK 25 MG TABS tablet      . ondansetron (ZOFRAN) 8 MG tablet Take 1 tablet (8 mg) by mouth every 8 hours as needed for Nausea/Vomiting. 15 tablet 1   . pantoprazole (PROTONIX) 40 MG tablet Take 40 mg by mouth daily.     . predniSONE (DELTASONE) 50 MG tablet Take 1 tablet (50 mg) by mouth daily. Take 1 tablet by mouth 13 hours prior to scan then 1 tablet 7 hours prior to scan then 1 tablet 1 hour prior to the scan 3 tablet 0   . zolpidem (AMBIEN) 5 MG tablet Take 5 mg by mouth nightly as needed for Insomnia.       No current facility-administered medications on file prior to visit.         Review of Systems -  Constitutional: no fever, chills or weight gain  Eyes: no vision changes  HENT: no headaches, nose bleeds or oral lesions  Cardiovascular: no chest pain or peripheral edema  Respiratory: no SOB, dyspnea or hemoptysis  Gastrointestinal: no hematemesis, hematochezia or melena. No  abdominal distention  Genitourinary: no dysuria  Musculoskeletal: no pain or swelling of muscles or joints  Integumentary: no rashes or jaundice  Neurological: no tremors or difficulties with memory  Psychiatric: Negative  Endocrine: no cold/heat intolerance, no excessive sweating or polyuria  Hematologic/Lymphatic: no easy bruising or bleeding  Allergic/Immunologic: negative, see allergy list  12 point ROS is otherwise negative    Liver Specific Review of Systems-  Confusion: No  Abdominal distention: No  Melena: No  Hematemesis: No  Hematochezia: No  Lower extremity edema: No  Jaundice: No        Physical Exam: (9 systems examined)     Vital Signs: There were no vitals taken for this visit.     BMI: There is no height or weight on file to calculate BMI.     Systems:  GEN: appears well, in no apparent distress. Alert and oriented times three, pleasant and cooperative.  HEENT: no sclera icterus, O/P: mmm no lesions  Lymph nodes:  negative lymph nodes  Pulmonary: CTAB, no wheezes, good air movement  Cardiovascular: regular rate & rhythm,  no murmurs, gallops or rubs, no peripheral edema  Gastrointestinal: BS x4, soft NTND, liver and spleen not palpable  Rectal and genitalia: Not examined  Neurologic including mental status: Alert & oriented x3. No encephalopathy or asterixis  Dermatologic: no jaundice, rash, or spider angiomata  Musculoskeletal: Normal     Laboratory:   Results for orders placed or performed in visit on 11/20/18   Bilirubin, Direct, Blood Green Plasma Separator Tube   Result Value Ref Range    Bilirubin, Dir <0.2 <0.2 mg/dL   Prothrombin Time, Blood Blue   Result Value Ref Range    PT,Patient 11.7 9.7 - 12.5 sec    INR 1.1    Comprehensive Metabolic Panel Green Plasma Separator Tube   Result Value Ref Range    Glucose 266 (H) 70 - 99 mg/dL    BUN 17 8 - 23 mg/dL    Creatinine 0.72 0.51 - 0.95 mg/dL    GFR >60 mL/min    Sodium 137 136 - 145 mmol/L    Potassium 4.2 3.5 - 5.1 mmol/L    Chloride 100 98  - 107 mmol/L    Bicarbonate 24 22 -  29 mmol/L    Anion Gap 13 7 - 15 mmol/L    Calcium 9.4 8.5 - 10.6 mg/dL    Total Protein 7.5 6.0 - 8.0 g/dL    Albumin 4.5 3.5 - 5.2 g/dL    Bilirubin, Tot 0.36 <1.2 mg/dL    AST (SGOT) 20 0 - 32 U/L    ALT (SGPT) 12 0 - 33 U/L    Alkaline Phos 126 35 - 140 U/L   CBC w/ Diff Lavender   Result Value Ref Range    WBC 6.5 4.0 - 10.0 1000/mm3    RBC 4.71 3.90 - 5.20 mill/mm3    Hgb 13.9 11.2 - 15.7 gm/dL    Hct 43.0 34.0 - 45.0 %    MCV 91.3 79.0 - 95.0 um3    MCH 29.5 26.0 - 32.0 pgm    MCHC 32.3 32.0 - 36.0 g/dL    RDW 13.2 12.0 - 14.0 %    MPV 11.4 9.4 - 12.4 fL    Plt Count 223 140 - 370 1000/mm3    Segs 62 %    Lymphocytes 25 %    Monocytes 9 %    Eosinophils 3 %    Basophils 1 %    ANC-Automated 4.1 1.6 - 7.0 1000/mm3    Abs Lymphs 1.6 0.8 - 3.1 1000/mm3    Abs Monos 0.6 0.2 - 0.8 1000/mm3    Abs Eosinophils 0.2 0.0 - 0.5 1000/mm3    Abs Basophils 0.1 0.0 1000/mm3    Diff Type Automated         Endoscopy:      Pathology:      Imaging:   06/09/2017 MRI/MRE  Redemonstration of a 6.8 cm T1 hyperintense lesion at the liver dome which is suboptimally evaluated given artifact at the liver dome, but is unchanged in size and morphology compared to 2017. Given the artifact, the liver dome parenchyma is incompletely assessed.  Repeat MRI with contrast is recommended for further evaluation, at no additional charge to the patient. At the time of the repeat MRI an addendum can be issued to this report.  2. Moderate to severe hepatic steatosis 15-26%.  3. Estimated stiffness: mean 2.6 kPa, no detectable degree of fibrosis.    I have reviewed all lab documents in media and imaging and procedures in EPIC    Assessment and Plan:  Cathy Lucas is a 69 year old female with a past medical history ofRA.DM, HTN, HLD, as well as known hepatic cyst (dx around 2006 per notes but patient said actually late 90s, has had bleed into cyst 2016and thinks maybe again 2017), here for follow up for  liver cyst and biopsy proven NASH.     Liver Cystlong standing. Likely simple cyst based on appearance on MRI and likely not contributing to pt abd pain given location of cyst and improvement of abd pain   - Per Dr. Marykay Lex read of OSH imaging (report not available in Epic) recurrence of cyst 5 cm  - Repeat MRI in 10/2019  - Will order second read of prior MRI   - Liver cyst does not need to be resected or biopsied at this time. Cyst location is not ideal at the dome and patient is anticoagulated with multiple comorbidities. Would only pursue rx if truly felt to have malignant potential.   - Her abd pain seems less likely due to this an more likely other GI process or musculoskeletal pain  - advised to contact her  community GI Dr. Vernard Gambles to w/u abd pain and GI symptoms of painful gas and constipation    NASH: biopsy proven NASH with fibrosis  - labs improved with weight loss. Weight currently stable at 206 lbs. 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   - labs drawn 11/20/18 normal  -- Risk modification:   1. Current BMI: stable Discussed diet/excercise.  2. HLD: management per primary care physician. Counseled pt on establishing with new PCP as her old PCP left. No contra-indication to statins in this population.   3. Diabetes: management per primary care physician. Counseled pt on establishing with new PCP as her old PCP left. a1c 7.9. She is not on medications.     Rheumatoid arthritis  - pt saw rheum in [place] who started her on lefluonamide and plaquenil which she states she did not tolerate or experience any improvement with   - She will establish with new rheum for tx of RA    HCM:   - HBV: not immune     Follow up in hepatology in 3-4 months      Riley Nearing, MD  [facility] PGY 3  [phone]    Patient Instructions   Liver MRI September 2021: Please call [facility] Scheduling at [phone] to schedule your appointment.     See a new PCP    Establish with  rheumatology.     Continue to work on weight loss as you are doing.     As a reminder, between visits, please do not go to the lab without confirming you have lab orders in the computer. You may call the lab at [phone] to confirm.     It was nice seeing you today.  If you have any questions or concerns please do not hesitate to call your care team    Marlowe Kays, M.D.  Assistant Professor  [facility]      My Care team    Wetzel Bjornstad NP  Hepatology Nurse Practitioner: medication questions and clinic visits when you are stable and doing well or when you need urgent follow up.     Rhys Martini, RN   Hepatology Nurse: refill medications or medical questions  Direct Line: [phone]    Call Center  Appointments, procedures or insurance issues  Phone: [phone]

## 2019-03-26 NOTE — Progress Notes (Signed)
Attending Attestation:    I personally interviewed and examined the patient on 03/26/2019 , and I have reviewed the note by Dr. Gwynn Burly from 03/26/2019 .    I agree w/ the resident history, exam, assessment, and plan with the following additions:     Additional attending documentation:     Labs reviewed  Results for Cathy Lucas, Cathy Lucas (MRN [id number]) as of 03/26/2019 20:25   Ref. Range 11/20/2018 15:33   Alkaline Phos Latest Ref Range: 35 - 140 U/L 126   ALT (SGPT) Latest Ref Range: 0 - 33 U/L 12   AST (SGOT) Latest Ref Range: 0 - 32 U/L 20   Bilirubin, Dir Latest Ref Range: <0.2 mg/dL <0.2   Bilirubin, Tot Latest Ref Range: <1.2 mg/dL 0.36   Albumin Latest Ref Range: 3.5 - 5.2 g/dL 4.5   Total Protein Latest Ref Range: 6.0 - 8.0 g/dL 7.5       11/01/18 MRI reviewed in impax: stable heaptic cyst. Steatosis. No nodularity.     Rads report:           05/2017 MRI  Moderate to severe hepatic steatosis 15-26%. Estimated stiffness: mean 2.6 kPa, no detectable degree of fibrosis.    Cathy Lucas is a 69 year old woman with a past medical history of RA. DM, HTN, HLD, as well as known hepatic cyst (dx around 2006 per notes but patient said actually late 90s, has had bleed into cyst 2016 and thinks maybe again 2017), here for follow up for liver cyst and biopsy proven NASH. Bx was 06/09/2015 and showed NASH with periportal and focal septal fibrosis(stage 2-3 of 4). Small focus of benign epithelial lining suggestive of benign cyst, with adjacent chronic inflammation, reactive fibrosis, and hemosiderin depostition Liver cyst aspirate at that time showed hypocellular specimen. Rare benign hepatocysts and macrophages. No malignant cells identified.    # Liver Cyst: long standing. Has had complex material but she has had bleeding in the cyst. Able to get prior images but all occurred after suspected bleed. She had cyst aspirate and wall biopsy which are both benign (2017).   -- liver cyst appears stable. Second read ordered.   -- if this  lesion needs to be treated, likely needs surgery > IR.  Cyst location is not ideal at the dome and patient is anticoagulated with multiple comorbidities. Would only pursue rx if truly felt to have malignant potential. I do not think that her GI sx are currently explained by this cyst given location and symtptom characteristics.   -- repeat MRI ordered for 1 year.     # NASH: biopsy proven NASH with fibrosis  -- labs improved with weight loss. 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 Of note, the area of biopsy was right at the cyst which can have an effect on fibrosis staging due to some mass effect. I am inclined to trust the MRI more in this case.   -- Risk modification:      1. Current BMI: stable.  Discussed diet/excercise.      2. HLD: management per primary care physician. No contra-indication to statins in this population.      3. Diabetes: management per primary care physician. She needs new PCP vs endocrine. She says she is off glipizide because her cardiologist said it was not good for her heart? She should confirm with cards because this is the only drug she has tolerated (apparently wen to ED 2/2 metformin,  januvia and another agent). Currently on diet control only. Sounds like blood sugars are above goal.      4. RA: she is not on any medications. Would avoid MTX and steroids if possible. However, other DMARDs ok as are biologics. Would recommend she follow up with rheum (she is trying to get new provider) to discuss risk of NO treatment (she is currently off all medications because of concern for side effects) vs treatment.  -- Medications: none at this time.  Vit E can be considered if enzymes rise but enzymes normal and fibrosis minimal on MRE.     # COVID 19: vaccination discussed . Can discuss further with PCP. Does not have history of anaphylaxis to vaccine components.     I spent 35 minutes caring for this patient today, to include reviewing and ordering labs and  other diagnostic testing, reviewing their medical records, seeing the patient, and documenting in the record. This is outside of separate time performed by the resident.

## 2019-03-26 NOTE — Patient Instructions (Signed)
Liver MRI September 2021: Please call [facility] Scheduling at [phone] to schedule your appointment.     See a new PCP    Establish with rheumatology.     Continue to work on weight loss as you are doing.     As a reminder, between visits, please do not go to the lab without confirming you have lab orders in the computer. You may call the lab at [phone] to confirm.     It was nice seeing you today.  If you have any questions or concerns please do not hesitate to call your care team    Marlowe Kays, M.D.  Assistant Professor  [facility]      My Care team    Wetzel Bjornstad NP  Hepatology Nurse Practitioner: medication questions and clinic visits when you are stable and doing well or when you need urgent follow up.     Rhys Martini, RN   Hepatology Nurse: refill medications or medical questions  Direct Line: [phone]    Call Center  Appointments, procedures or insurance issues  Phone: [phone]

## 2019-03-30 ENCOUNTER — Encounter (INDEPENDENT_AMBULATORY_CARE_PROVIDER_SITE_OTHER): Payer: Self-pay

## 2019-04-22 ENCOUNTER — Telehealth (INDEPENDENT_AMBULATORY_CARE_PROVIDER_SITE_OTHER): Payer: Self-pay | Admitting: Hepatology

## 2019-04-22 NOTE — Telephone Encounter (Signed)
Left detailed message for patient advising there are no liver disease specific contraindications to COVID vaccine.  Encouraged to follow CDC recommendations.  Discuss with surgeon recommendations for post surgery timeline.   Lastly recommend getting any of the vaccinations and stressed she may not have a choice with limited supply.

## 2019-04-22 NOTE — Telephone Encounter (Signed)
Pt called stating she had questions about COVID vaccine.  Has been told by Dr. Marykay Lex to get it but states she will be having toe surgery tmrw & would like to know if they put her on antibiotics if she will still be okay to get it?  Also, should she be Hotel manager, Science writer or [employer]?   With her condition is she okay to get it?  Please contact pt. Thank you.      Pt states okay to leave detailed VM incase of no answer.

## 2019-05-30 ENCOUNTER — Telehealth (HOSPITAL_COMMUNITY): Payer: Self-pay | Admitting: Nurse Practitioner

## 2019-05-30 NOTE — Telephone Encounter (Signed)
Got a call from the son stating that his Mom has been having some elevated BP issues, she was seen by a NP in Dr. Jomarie Longs office. She got the COVID vaccine on 05/15/19 and about 10 days after her SBP elevated 190's (two spikes) otherwise  BP 143/94 HR 79. She called her MD and they had her take NTG with decrease in BP. She also has had some chest pains with elevated BP. Occurred again with some chest pressure with elevated BP which resolved when she took her Coreg. So the son is concerned with what is going on with his Mom and looking to maybe establish care with a cardiologist.   Her  second is scheduled for 06/05/19. Discussed postponing this for now.   So they are getting a stress test and PFT on her up in [place]. She had an ECHO before the vaccine, per patient and son report was told he was fine and everything looks good. No medication changes yet. We will request all the records.   Agree with testing and offered referring to cardiologist in [place], and if needed we will get her down here to see [place].   Will follow up with them next week and see how things are going. ED precautions given.  They agreed with the plan. Thanked me for the call.

## 2019-06-03 ENCOUNTER — Encounter (HOSPITAL_COMMUNITY): Payer: Self-pay | Admitting: Nurse Practitioner

## 2019-06-03 DIAGNOSIS — I10 Essential (primary) hypertension: Secondary | ICD-10-CM

## 2019-06-03 DIAGNOSIS — Z952 Presence of prosthetic heart valve: Secondary | ICD-10-CM

## 2019-06-03 NOTE — Progress Notes (Signed)
Called the patient and son to follow up on status after call last week. Referral placed to general cardiology in [place] to establish with general cardiology at [place] per patient and family request, apparently both Cardiologist she was following up in [place] has left the practice, so they prefer to establish with [place] if possible.   Lavonia Drafts, MSN, ANP-BC   Interventional Cardiology/Structural Heart Nurse Practitioner  [phone]

## 2019-08-05 ENCOUNTER — Encounter (HOSPITAL_COMMUNITY): Payer: Self-pay | Admitting: Nurse Practitioner

## 2019-08-05 DIAGNOSIS — I272 Pulmonary hypertension, unspecified: Secondary | ICD-10-CM

## 2019-08-05 DIAGNOSIS — R0602 Shortness of breath: Secondary | ICD-10-CM
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
## 2019-09-03 ENCOUNTER — Ambulatory Visit: Payer: Medicare Other | Attending: Nurse Practitioner | Admitting: Internal Medicine

## 2019-09-03 VITALS — BP 160/100 | HR 88 | Wt 174.0 lb

## 2019-09-03 DIAGNOSIS — K7581 Nonalcoholic steatohepatitis (NASH): Secondary | ICD-10-CM | POA: Insufficient documentation

## 2019-09-03 DIAGNOSIS — I342 Nonrheumatic mitral (valve) stenosis: Secondary | ICD-10-CM

## 2019-09-03 DIAGNOSIS — Z9889 Other specified postprocedural states: Secondary | ICD-10-CM | POA: Insufficient documentation

## 2019-09-03 DIAGNOSIS — R0600 Dyspnea, unspecified: Secondary | ICD-10-CM | POA: Insufficient documentation

## 2019-09-03 DIAGNOSIS — R0609 Other forms of dyspnea: Secondary | ICD-10-CM

## 2019-09-03 DIAGNOSIS — R0602 Shortness of breath: Secondary | ICD-10-CM | POA: Insufficient documentation

## 2019-09-03 DIAGNOSIS — I272 Pulmonary hypertension, unspecified: Secondary | ICD-10-CM

## 2019-09-03 NOTE — Progress Notes (Signed)
Pulmonary Hypertension Clinic  [facility], [place], [place], [place]    Attending: Aretta Nip, M.D. 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 She recently became acutely SOB and ill post COVID vaccine. She has not fully recovered from this and one of her providers has asked not proceed with a second dose. No COVID infection. Overall FC III. No syncope, CP. Other than NASH, no clear PAH risk factors. No VTE/PE history.  Denies h/o GIB or endoscopy, no HE.      Medications:  Current Outpatient Medications   Medication Sig Dispense Refill    aspirin 81 MG tablet Take 81 mg by mouth daily.      atorvastatin (LIPITOR) 10 MG tablet Take 10 mg by mouth daily.      Buprenorphine HCl (BELBUCA) 150 MCG FILM Suck 150 mcg in mouth 2 times daily.      carvedilol (COREG) 25 MG tablet Take 25 mg by mouth 2 times daily (with meals).      Cholecalciferol (VITAMIN D PO) Take 50,000 capsules by mouth once a week.        clopidogrel (PLAVIX) 75 MG tablet Take 75 mg by mouth daily.      cyclobenzaprine (FLEXERIL) 10 MG tablet       diphenhydrAMINE (BENADRYL) 25 MG capsule Take 2 capsules (50 mg) by mouth every 6 hours as needed for Itching. 30 capsule 3    ergocalciferol (VITAMIN D) 50000 UNIT capsule       glucose blood (TRUE METRIX BLOOD GLUCOSE TEST) test strip       HYDROmorphone (DILAUDID) 2 MG tablet Take 4 mg by mouth every 6 hours as needed for Moderate Pain (Pain Score 4-6).       Lancets (STERILANCE TL) MISC       latanoprost (XALATAN) 0.005 % ophthalmic solution Place 1 drop into both eyes every evening.      levothyroxine (SYNTHROID) 100 MCG tablet Take 88 mcg by mouth daily.       lidocaine (XYLOCAINE) 5 % ointment       ondansetron (ZOFRAN) 8 MG tablet Take 1 tablet (8 mg) by mouth every 8 hours as needed for Nausea/Vomiting. 15 tablet 1     pantoprazole (PROTONIX) 40 MG tablet Take 40 mg by mouth daily.      zolpidem (AMBIEN) 5 MG tablet Take 5 mg by mouth nightly as needed for Insomnia.       No current facility-administered medications for this visit.       Exam: Alert, NAD  BP (!) 160/100 (BP Location: Left arm, BP Patient Position: Sitting, BP cuff size: Regular)    Pulse 88    Wt 78.9 kg (174 lb)    LMP  (LMP Unknown)    SpO2 96%    BMI 29.41 kg/m   HEENT: WNL, OP clear  Neck: no TM, no LAD, supple  Sternal wound healed  No JVD, no HJR  RRR, accentuated P2, no sign m, no S3, no S4, no rub  Lungs: CTA & P, no flow murmur  Abd: soft, NT, liver edge not palpable, no ascites  Ext: No edema; no cyanosis; no clubbing fingers/toes    Last Echo 2018    Impressions/Diagnoses:  1) Risk for PH associated with mitral valve process/HFpEF or liver disease  2) NYHAC III  3)  MVR  4) HTN  5) NASH    Recommendations:  1) Request immediate copy of echo and PFTs report recently done OSH  2) Telemedicine visit after receiving these studies  3) Possible VQ scan followed by RHC (or combined RHC/LHC depending on input from her cardiology team)  4) She has hydralazine PRN already prescribed. To check BP and dose if >160/>90.    All questions were answered.    Aretta Nip, M.D.  Clinical Professor of Medicine  Pulmonary Vascular Medicine

## 2019-09-05 ENCOUNTER — Encounter (HOSPITAL_COMMUNITY): Payer: Self-pay | Admitting: Internal Medicine

## 2019-09-05 NOTE — Progress Notes (Signed)
Attending Addendum:  I received and reviewed the outside echo from 3/21 and PFTs from 6/21.  Normal RA/RV size and function, no mention of TR or PH.  Bioprosthetic MV noted with gradient 63mHg.  PFTs with borderline low volumes, DLCO 49%.    Low suspicion for PAH or CTEPH, especially combining with her reporting of improved DOE. No RHC for the purpose of PH eval necessary at this point. Defer to cardiology/PCP for ongoing management of BP and monitoring post MV interventions.    NAretta Nip M.D.  Pulmonary Vascular Medicine Attending  PID# [id number] Pager [phone]  Office: ext [phone]

## 2019-09-23 NOTE — Progress Notes (Unsigned)
[facility] Telemedicine MyChart Video Visit      ---------------------(data below generated by Mikey College, NP)--------------------    Patient Verification & Telemedicine Consent:    I am proceeding with this evaluation at the direct request of the patient.  I have verified this is the correct patient and have obtained verbal consent and written consent from the patient/ surrogate to perform this voluntary telemedicine evaluation (including obtaining history, performing examination and reviewing data provided by the patient).   The patient/ surrogate has the right to refuse this evaluation.  I have explained risks (including potential loss of confidentiality), benefits, alternatives, and the potential need for subsequent face to face care. Patient/ surrogate understands that there is a risk of medical inaccuracies given that our recommendations will be made based on reported data (and we must therefore assume this information is accurate).  Knowing that there is a risk that this information is not reported accurately, and that the telemedicine video, audio, or data feed may be incomplete, the patient agrees to proceed with evaluation and holds [place] harmless knowing these risks. In this evaluation, we will be providing recommendations only. The patient/ surrogate has been notified that other healthcare professionals (including students, residents and Metallurgist) may be involved in this audio-video evaluation.   All laws concerning confidentiality and patient access to medical records and copies of medical records apply to telemedicine.  The patient/ surrogate has received the [place] Notice of Privacy Practices.  I have reviewed this above verification and consent paragraph with the patient/ surrogate.  If the patient is not capacitated to understand the above, and no surrogate is available, since this is not an emergency evaluation, the visit will be rescheduled until such time that the patient can consent, or the  surrogate is available to consent.    Demographics:   Medical Record #: [id number]   Date: September 24, 2019   Patient Name: Cathy Lucas   DOB: [birth year]  Age: 69 year old  Sex: female  Location: Home address on file    Evaluator(s):   Cathy Lucas was evaluated by me today.    Clinic Location: [facility]  [address]  [place]  [id number]    Telemedicine Video Visit Telemedicine Appointment:Patient consent was acquired.    Cathy Lucas is a 69 year old female who is attending a virtual based visit.Pt with a past medical history of RA. DM, HTN, HLD, as well as known hepatic cyst (dx around 2006 per notes but patient said actually late 90s, has had bleed into cyst 2016 and thinks maybe again 2017), here for follow up for liver cyst and biopsy proven NASH. Bx was 06/09/2015 and showed NASH with periportal and focal septal fibrosis(stage 2-3 of 4). Small focus of benign epithelial lining suggestive of benign cyst, with adjacent chronic inflammation, reactive fibrosis, and hemosiderin depostition Liver cyst aspirate at that time showed hypocellular specimen. Rare benign hepatocysts and macrophages. No malignant cells identified. She has since had MRE with 2.6 kPa which is more c/w F1 fibrosis so potentially biopsy sample may have been close to or in cyst which may have skewed the fibrosis interpretation of advanced fibrosis. Pt is attending [facility] today she denies any abd or RUQ pain denies any GI symptoms. Pt denies ascites, edema, hematemesis, hematochezia, denies melena, jaundice, pruritis or acholic stools, denies hepatic encephalopathy (no overt encephalopathy, no reversal of sleep/wake cycles, no memory/attention issues). Pt main concern is  what she describes as a reaction from her COVID vaccine on 04/2019 causing SOB and CP. She is under care with cardiology and Pulmonology who have advised her not to get second dose.  Pt states still "not  feeling right" she is very anxious about this.     Review of Systems:  Review of Systems - Constitutional: negative.  CV: negative.  Resp: negative.  GI: negative.  Musculoskeletal: negative.  Integumentary: negative.  Neuro: negative.    Past Medical History:  Patient Active Problem List   Diagnosis   . Fibromyalgia   . Abdominal pain   . Liver mass   . Hypertension   . Hypothyroidism   . NASH (nonalcoholic steatohepatitis)   . Liver cyst   . BMI 27.0-27.9,adult   . Pulmonary HTN (CMS-HCC)   . Mitral stenosis   . S/P mitral valve repair   . DOE (dyspnea on exertion)       Medications were reviewed and updated on the Active Medication List     Psychosocial:  Social History     Tobacco Use   . Smoking status: Never Smoker   . Smokeless tobacco: Never Used   Substance Use Topics   . Alcohol use: No   . Drug use: No        Allergies   Allergen Reactions   . Ceftriaxone Rash   . Iodine Rash   . Iv Contrast [Contrast Media] Anaphylaxis     Itching swelling unable to breathe     . Latex Rash   . Metformin Rash   . Ativan [Lorazepam] Other     Heart palpitations     . Januvia [Sitagliptin] Other     Sob, chest pain, heart palpitations     . Macrobid [Nitrofurantoin] Unspecified   . Tylenol [Acetaminophen] Other     bc of the lesion of the liver.       The Family History:  Family History   Problem Relation Name Age of Onset   . Stroke Mother     . Heart Disease Mother     . Stroke Father     . Heart Disease Father         Physical Exam:  GENERAL: healthy, alert, no distress, cooperative, smiling  MENTAL STATUS: Full and Appropriate  HEENT: Sclera clear, anicteric  CHEST PHYSICAL FINDINGS: not examined easy breathing while speaking  ABDOMEN INSPECTION: flat  EXTREMITY LOCATION: grossly intact  BEHAVIORAL: Denies significant problems with depression or anxiety.  NEUROLOGICAL: Gait normal. Moves all four extremities spontaneously. Speech normal.    Studies  Na 137 (09/30) CL 100 (09/30) BUN 17 (09/30) GLU   266* (09/30)   K  4.2 (09/30) CO2 24 (09/30) Cr 0.72 (09/30)        WBC 6.5 (09/30) HGB 13.9 (09/30) PLT 223 (09/30)    HCT 43.0 (09/30)        PT 11.7 (09/30) PTT     INR 1.1 (09/30)         Imaging:   11/01/2018 MRI  CLINICAL HISTORY: Second read of outside MR abdomen requested by Dr. Marykay Lex for treatment planning  Simple cyst versus cystadenomaCOMPARISON:  MR abdomen/elastography 06/11/2017 as well as several additional prior examinations dating back to August of 2016  FINDINGS:  LUNG BASES: Unremarkable.  LIVER: Mild hepatomegaly with diffuse though heterogeneous areas hepatic steatosis (PDFF 31-39%). Unchanged T2 hyperintense lesion within the hepatic dome with mild intrinsic T1 hyperintensity this appears slightly decreased in size and now  measures 6.0 x 5.4 cm (series 7, image 6), previously 6.8 x 6.1 cm. There is a thin T2 hypointense rim without associated enhancing nodularity. This appeared present in August of 2016 and appears to have progressively decreased in size since this earliest available examination. A few scattered additional T2 hyperintense lesions within the liver again likely reflect small cysts.  BILIARY: Prior cholecystectomy. No bile duct dilatation.  PANCREAS: Mild diffuse fatty infiltration of the pancreas.  SPLEEN: Unremarkable.  ADRENAL GLANDS: Unremarkable.  KIDNEYS: Unremarkable.  STOMACH/DUODENUM: Unremarkable.  VASCULATURE: Unremarkable.  LYMPHATIC:Unremarkable.  SMALL AND LARGE BOWEL: Unremarkable.  BONES: Unremarkable.  ABDOMINAL WALL: Unremarkable.  IMPRESSION:  1. Decreased size of the complex cyst within the hepatic dome, which most likely represents a cyst with prior internal hemorrhage.  2. Mild hepatomegaly with persistent diffuse hepatic steatosis.  06/09/2017 MRE  MR elastography technique: Magnetic Resonance elastography of the liver was performed at 1.5 Tesla, using a <Spin Echo> sequence at a vibration frequency of <60> Hertz.  Findings: Technical adequacy: <adequate, borderline,  inadequate> where adequate is define as ROI size > 700, borderline is 500-700, and inadequate is < 500  COMPARISON:  MRI from 11/25/2015.  FINDINGS:  LUNG BASES: Unremarkable. Median sternotomy wires and mitral valve replacement.  LIVER: 6.8 x 6.1 x 3.3 cm T2 hyperintense lesion at the hepatic dome, with intrinsic T1 hyperintensity, which is incompletely assessed for enhancement due to artifact. Overall the lesion is unchanged in size compared to 2017. There is limited evaluation of the hepatic dome secondary to artifact. Segments 3, 4B, 5 and 6 are without suspicious lesions. Moderate to severe hepatic steatosis, PDFF=15-26%. No iron overload.  BILIARY: Unremarkable  PANCREAS: Unremarkable  SPLEEN: Unremarkable  ADRENALS: Unremarkable  KIDNEYS: Unremarkable  VASCULATURE: Unremarkable  LYMPHATIC: Unremarkable  SMALL BOWEL: Visualized portions are unremarkable  LARGE BOWEL: Visualized portions are unremarkable  MESENTERY / OMENTUM: Unremarkable  PERITONEAL CAVITY: Unremarkable  ABDOMINAL WALL: Unremarkable  BONES: Unremarkable    Estimated stiffness: mean 2.6 kPa    Comment: MRE-estimated stiffness of the liver is a marker of liver fibrosis. Based on meta-analysis data, stiffness > 2.8 kPa suggests some degree of fibrosis (F1 or greater), stiffness > 3.6 kPa suggests advanced fibrosis (F3 or greater), and a longitudinal change of  > 20% is considered to represent true change. Note that stiffness values may vary depending on etiology of liver disease, field strength, operator experience, and other factors.    ASSESSMENT AND PLAN  Cathy Lucas is a 69 year old female who was provided virtual visit with video visit care delivery. Pt with Liver Cyst: long standing. Has had complex material but she has had bleeding in the cyst. Able to get prior images but all occurred after suspected bleed. She had cyst aspirate and wall biopsy which are both benign (2017).   Liver cyst   - appears stable. 11/01/2019 second read  MRI with decreased size of the complex cyst within the hepatic dome, which most likely represents a cyst with prior internal hemorrhage.  - if this lesion needs to be treated, likely needs surgery > IR.  Cyst location is not ideal at the dome and patient is anticoagulated with multiple comorbidities. Would only pursue rx if truly felt to have malignant potential.  - she has no RUQ pain or any other GI symptoms today   -  repeat MRI ordered for 1 year. Due 92021    NASH: biopsy proven NASH with fibrosis  -- labs improved with weight loss.  MRE with kPa 2.6 which is better than the staging on her initial biopsy. Of note, the area of biopsy was right at the cyst which can have an effect on fibrosis staging due to some mass effect. I am inclined to trust the MRI more in this case.   -- Risk modification:      1. Current BMI: stable.though no wt due to video visit  Discussed diet/excercise.      2. HLD: management per primary care physician. No contra-indication to statins in this population.      3. Diabetes: management per primary care physician. On (DPP-4) Inhibitor     4. RA: she is not on any medications. Would avoid MTX and steroids if possible. However, other DMARDs ok as are biologics. Would recommend she follow up with rheum (she is trying to get new provider) to discuss risk of NO treatment (she is currently off all medications because of concern for side effects) vs treatment.  -- Medications: none at this time.  Vit E can be considered if enzymes rise but enzymes normal and fibrosis minimal on MRE.   We will get a set of labs - she is requesting to go to [place] [facility]: vaccination discussed . Can discuss further with cardiology and pulmonology. Does not have history of anaphylaxis to vaccine components.     I spent 45 minutes caring for this patient today, to include reviewing and ordering labs and other diagnostic testing, reviewing their medical records, seeing the patient, and documenting in the  record.   Cathy Lucas was seen today for recheck.    Diagnoses and all orders for this visit:    NASH (nonalcoholic steatohepatitis)  -     CBC w/ Diff Lavender; Future  -     Comprehensive Metabolic Panel; Future  -     Prothrombin Time, Blood Blue; Future  -     Bilirubin, Direct, Blood Green Plasma Separator Tube; Future    Fatty liver  -     CBC w/ Diff Lavender; Future  -     Comprehensive Metabolic Panel; Future  -     Prothrombin Time, Blood Blue; Future  -     Bilirubin, Direct, Blood Green Plasma Separator Tube; Future        The plan was carefully reviewed verbally with the patient and also affirmed that the patient understood next steps and follow up plan.    Orders this encounter:  Lab   Orders Placed This Encounter   Procedures   . CBC w/ Diff [place]   . Comprehensive Metabolic Panel   . Prothrombin Time, Blood Blue   . Bilirubin, Direct, Blood Green Plasma Separator Tube     Imaging No orders of the defined types were placed in this encounter.    Procedures No orders of the defined types were placed in this encounter.    Other No orders of the defined types were placed in this encounter.      RETURN TO CLINIC INSTRUCTIONS  Return to clinic in 6 month(s) for f/u

## 2019-09-24 ENCOUNTER — Telehealth (HOSPITAL_BASED_OUTPATIENT_CLINIC_OR_DEPARTMENT_OTHER): Payer: Self-pay | Admitting: Nurse Practitioner

## 2019-09-24 ENCOUNTER — Encounter (INDEPENDENT_AMBULATORY_CARE_PROVIDER_SITE_OTHER): Payer: Self-pay | Admitting: Nurse Practitioner

## 2019-09-24 ENCOUNTER — Telehealth (INDEPENDENT_AMBULATORY_CARE_PROVIDER_SITE_OTHER): Payer: Medicare Other | Admitting: Nurse Practitioner

## 2019-09-24 VITALS — Ht 64.0 in | Wt 166.0 lb

## 2019-09-24 DIAGNOSIS — I050 Rheumatic mitral stenosis: Secondary | ICD-10-CM

## 2019-09-24 DIAGNOSIS — K7689 Other specified diseases of liver: Secondary | ICD-10-CM

## 2019-09-24 DIAGNOSIS — K7581 Nonalcoholic steatohepatitis (NASH): Secondary | ICD-10-CM

## 2019-09-24 DIAGNOSIS — I272 Pulmonary hypertension, unspecified: Secondary | ICD-10-CM

## 2019-09-24 DIAGNOSIS — Z9889 Other specified postprocedural states: Secondary | ICD-10-CM

## 2019-09-24 DIAGNOSIS — K760 Fatty (change of) liver, not elsewhere classified: Secondary | ICD-10-CM

## 2019-09-24 MED ORDER — HYDRALAZINE HCL 10 MG OR TABS
ORAL_TABLET | ORAL | Status: DC
Start: 2019-07-11 — End: 2020-03-22

## 2019-09-24 NOTE — Telephone Encounter (Signed)
Labs have been placed in [facility] portal.  Pt aware.    [facility] MA/HA2  Hepatology Care Team   Ext: [phone]

## 2019-09-24 NOTE — Patient Instructions (Addendum)
1. Labs ordered pt will go to [facility]  2. We will follow up in 6 months    Thank you for allowing me to participate in your care today.    As a reminder, please do not go to the lab without confirming you have lab orders in the computer. You may call the lab at [phone] to confirm.     Please do not hesitate to call your Care Team at [phone] if you have any questions or concerns.    Marlowe Kays, M.D.  Hepatologist    Wetzel Bjornstad, MSN, FNP-BC  Hepatology Nurse Practitioner    Rhys Martini, RN refill medications or medical questions  [place]      Appointments, procedures or insurance issues  [facility] call center  [employer] [phone]

## 2019-10-01 ENCOUNTER — Ambulatory Visit: Payer: Medicare Other | Attending: Internal Medicine | Admitting: Internal Medicine

## 2019-10-01 DIAGNOSIS — R0600 Dyspnea, unspecified: Secondary | ICD-10-CM

## 2019-10-01 DIAGNOSIS — I050 Rheumatic mitral stenosis: Secondary | ICD-10-CM

## 2019-10-01 DIAGNOSIS — R0609 Other forms of dyspnea: Secondary | ICD-10-CM

## 2019-10-01 DIAGNOSIS — K7581 Nonalcoholic steatohepatitis (NASH): Secondary | ICD-10-CM | POA: Insufficient documentation

## 2019-10-01 DIAGNOSIS — I272 Pulmonary hypertension, unspecified: Secondary | ICD-10-CM | POA: Insufficient documentation

## 2019-10-01 DIAGNOSIS — Z9889 Other specified postprocedural states: Secondary | ICD-10-CM | POA: Insufficient documentation

## 2019-10-02 NOTE — Progress Notes (Signed)
Attending Telemedicine Visit (unable to connect via scheduled video so converted to telephone call):  The patient and son were together for this follow-up call. I have reviewed the PFTs previously and again via complete report forwarded to media. Lifelong non-smoker, no asthma history. Low risk for COPD. More likely the defects are associated with known CHF (HFpEF)/PH post mitral stenosis.    She has NASH and MS treated by our team (Dr. Parke Poisson and colleagues) in 2019. See Dr. Demetrio Lapping cath report in EMR from 2018. She has now DOE and signs of PH. She has not had re-evaluation since the procedure. Although I cannot rule out [place] associated with her liver condition, most likely PH is associated with either mitral valve or residual arteriopathy they can develop and persist from MS. Her risk for CTEPH is low.    Accordingly, I have been advising her to have [place] with me or better yet consider evaluation by our cardiology team with consideration for RHC/LHC. I will refer her back to Dr. Parke Poisson and team for that consideration. If combined cath is not deemed necessary, I would at minimum recommend RHC which I can perform and include additional assessments such as exercise and iNO if needed.    Aretta Nip, M.D.  Pulmonary Vascular Medicine Attending  PID# [id number], Pager [phone]  Office: ext [phone]

## 2019-12-05 ENCOUNTER — Other Ambulatory Visit: Payer: Self-pay

## 2019-12-06 LAB — COMPREHENSIVE METABOLIC PANEL, BLOOD
A/G Ratio: 1.9 (ref 1.2–2.2)
ALT (SGPT): 8 IU/L (ref 0–32)
AST: 14 IU/L (ref 0–40)
Albumin: 4.7 g/dL (ref 3.8–4.8)
Alkaline Phos: 109 IU/L (ref 44–121)
BUN/Creatinine Ratio: 17 (ref 12–28)
BUN: 13 mg/dL (ref 8–27)
Bilirubin, Total: 0.5 mg/dL (ref 0.0–1.2)
Calcium: 9.8 mg/dL (ref 8.7–10.3)
Carbon Dioxide: 24 mmol/L (ref 20–29)
Chloride: 97 mmol/L (ref 96–106)
Creatinine: 0.76 mg/dL (ref 0.57–1.00)
Globulin, Total: 2.5 g/dL (ref 1.5–4.5)
Glucose: 234 mg/dL — ABNORMAL HIGH (ref 65–99)
Potassium: 4.5 mmol/L (ref 3.5–5.2)
Protein, Total, Serum: 7.2 g/dL (ref 6.0–8.5)
Sodium: 137 mmol/L (ref 134–144)
eGFR If Africn Am: 95 mL/min/{1.73_m2} (ref >59)
eGFR If NonAfricn Am: 82 mL/min/{1.73_m2} (ref >59)

## 2019-12-06 LAB — CBC WITH DIFF, BLOOD
Baso (Absolute): 0.1 10*3/uL (ref 0.0–0.2)
Basos: 1 %
Eos (Absolute): 0.2 10*3/uL (ref 0.0–0.4)
Eos: 3 %
Hematocrit: 42.3 % (ref 34.0–46.6)
Hemoglobin: 13.8 g/dL (ref 11.1–15.9)
Immature Grans (Abs): 0 10*3/uL (ref 0.0–0.1)
Immature Granulocytes: 0 %
Lymphs (Absolute): 1.3 10*3/uL (ref 0.7–3.1)
Lymphs: 19 %
MCH: 29.4 pg (ref 26.6–33.0)
MCHC: 32.6 g/dL (ref 31.5–35.7)
MCV: 90 fL (ref 79–97)
Monocytes(Absolute): 0.5 10*3/uL (ref 0.1–0.9)
Monocytes: 8 %
Neutrophils (Absolute): 4.8 10*3/uL (ref 1.4–7.0)
Neutrophils: 69 %
Platelets: 229 10*3/uL (ref 150–450)
RBC: 4.69 x10E6/uL (ref 3.77–5.28)
RDW: 12.7 % (ref 11.7–15.4)
WBC: 6.9 10*3/uL (ref 3.4–10.8)

## 2019-12-06 LAB — PROTHROMBIN TIME, BLOOD
INR: 1 (ref 0.9–1.2)
Prothrombin Time: 10.9 s (ref 9.1–12.0)

## 2019-12-06 LAB — BILIRUBIN TOTAL & DIRECT-LABCORP
Bilirubin, Direct: 0.15 mg/dL (ref 0.00–0.40)
Bilirubin, Indirect: 0.35 mg/dL (ref 0.10–0.80)

## 2020-02-03 ENCOUNTER — Telehealth (INDEPENDENT_AMBULATORY_CARE_PROVIDER_SITE_OTHER): Payer: Self-pay | Admitting: Family Medicine

## 2020-02-03 NOTE — Telephone Encounter (Signed)
 Rcvd call from patients son inquiring about paperwork. I let him know that she can just reply directly to the email that was sent to him with the filled out paperwork. Pt complied and I sent another copy of paperwork to ensure patient had correct papers//lz

## 2020-02-04 ENCOUNTER — Telehealth (INDEPENDENT_AMBULATORY_CARE_PROVIDER_SITE_OTHER): Admitting: Family Medicine

## 2020-02-04 VITALS — Ht 65.0 in | Wt 165.0 lb

## 2020-02-04 MED ORDER — TIMOLOL 0.5 % OP SOLN: 1.00 [drp] | Freq: Two times a day (BID) | OPHTHALMIC | Status: AC

## 2020-02-04 MED ORDER — NITROGLYCERIN 0.4 MG SL SUBL
0.40 mg | SUBLINGUAL_TABLET | SUBLINGUAL | Status: DC | PRN
Start: ? — End: 2022-06-27

## 2020-02-04 NOTE — Interdisciplinary (Signed)
I, Rivka Barbara, have reviewed the patients allergies and medications.

## 2020-02-04 NOTE — Progress Notes (Signed)
 ---------------------(data below generated by Delynn Fill, MD)--------------------    Patient Verification & Telemedicine Consent:    I am proceeding with this evaluation at the direct request of the patient.  I have verified this is the correct patient and have obtained verbal consent and written consent from the patient/ surrogate to perform this voluntary telemedicine evaluation (including obtaining history, performing examination and reviewing data provided by the patient).   The patient/surrogate has the right to refuse this evaluation.  I have explained risks (including potential loss of confidentiality), benefits, alternatives, and the potential need for subsequent face to face care. Patient/surrogate understands that there is a risk of medical inaccuracies given that our recommendations will be made based on reported data (and we must therefore assume this information is accurate).  Knowing that there is a risk that this information is not reported accurately, and that the telemedicine video, audio, or data feed may be incomplete, the patient agrees to proceed with evaluation and holds us  harmless knowing these risks. All laws concerning confidentiality and patient access to medical records and copies of medical records apply to telemedicine.  The patient/surrogate has received [facility] Notice of Privacy Practices.  I have reviewed this above verification and consent paragraph with the patient/surrogate.  If the patient is not capacitated to understand the above, and no surrogate is available, since this is not an emergency evaluation, the visit will be rescheduled until such time that the patient can consent, or the surrogate is available to consent.    Demographics:  Medical Record #: [id number]   Date: February 04, 2020   Patient Name: Cathy Lucas   DOB: [birth year]  Age: 69 year old  Sex: female  Location: Home address on file      Evaluator(s):   Nitika Jackowski was evaluated by  me today.    Clinic Location:  [facility] [place]  [facility] [place]  [address], [address]  [place] [address]    SLEEP MEDICINE INITIAL CONSULTATION    Cathy Lucas is a 69 year old female with a history of DM, pulmonary HTN, NASH, insomnia, chronic pain on opiods, RA, HLD, hypothyroidism, asthma seen today as a new consultation at the request of Zenobia Hila for the chief complaint of insomnia evaluation.  Patient has been on Ambien 10mg  nightly x 20 years. She states she was started on ambien as she had trouble staying asleep 2/2 pain from RA and back pain. She states this is the only sleep aid she has been prescribed. With ambien- she denies trouble falling asleep, but continues to have frequent awakenings 2/2 urination, pain and waking up to reposition. She states she has prediabetes, although HbA1C 10/2019 was 9.6. She states she has lowered it to 7.2. Currently not on treatment for DM. She is on long standing opiod medications for pain- belbuca BID, diluadid TID to QID and prn flexeril. No prior sleep study or sleep evaluation. She presents today to discuss her sleep aid.    Sleep wake schedule:    Patient's time to bed is 10-11p and wake up time is 7-10a. It takes her 30 minutes to fall asleep- with ambien. She has 4-5 nocturnal awakenings due to urination, to reposition. It takes a few minutes to fall back asleep. She does maintain same schedule on weekends. Polack denies taking scheduled naps. denies prior shift work history.     Sleep Complaints:  Excessive daytime sleepiness: no  Nonrestorative sleep: no.  Wakes up refreshed.  Daytime fatigue: no    Cardio-respiratory-  Snoring: rare  Witnessed apneas: no  Waking up gasping/Choking: no  Waking up with heart pounding or racing: no  Excessive perspiration: no    RLS screen: Unpleasant, restless feeling in legs in the evening or while sleeping at night that can be relieved by walking or movement: no    Parasomnias:   NREM:   Denies recurrent persistent confusional arousal, night eating, sleep walking or sleep terrors.  REM:  Denies dream reenactment or injuries  No Hypnogogic/hypnopompic hallucinations, sleep paralysis and cataplexy    Other subjective complaints:  History of mood disorder:denies  History of pain: yes- RA, lumbar spine  Nocturnal GERD: yes  Dry mouth: no  Morning headache or confusion: no  Teeth grinding: no  Nocturia: 4-5x /night    Comorbid conditions: uncontrolled DM, pain, HTN, pulm HTN    Caffeine use: n/a  Sleep aids tried: Ambien 10mg  nightly x 20 years, Diluadid- last dose 9pm and Belbuca PM dose at 10pm. Flexeril prn.    Epworth Sleepiness Scale today is 0 (normal <10).     Review of Systems:    CONSTITUTIONAL: NEGATIVE for weight gain/loss, fever, chills, night sweats  EYES: NEGATIVE for changes in vision  ENT: NEGATIVE for ear pain, sore throat, sinus pain  CARDIAC: NEGATIVE for fluttering in chest, chest pain or pressure, breathlessness when lying flat, swollen legs  NEUROLOGIC: NEGATIVE headaches, weakness or numbness in the arms or legs  PULMONARY: NEGATIVE SOB, cough, wheezing  GASTROINTESTINAL: NEGATIVE for nausea, vomiting, abdominal pain  GENITOURINARY: NEGATIVE for painful urination, blood in urine, increase frequency  MUSCULOSKELETAL: + muscle pain, bone or joint pain    Past Medical and Surgical History:     Past Medical History:   Diagnosis Date   . Back pain     Needs L4 fusion   . Depression    . Diabetes (CMS-HCC)    . Fibromyalgia    . GERD (gastroesophageal reflux disease)    . Glaucoma    . Hypercholesterolemia    . Hypertension    . Hypothyroidism    . Migraines    . PAF (paroxysmal atrial fibrillation) (CMS-HCC)      Past Surgical History:   Procedure Laterality Date   . MITRAL VALVE REPAIR  12/26/2016    TMVR   . LIVER BIOPSY  05/22/2015    steatohepatitis with periportal and focal septal fibrosis(stage 2-3 of 4). Small focus of benign epithelial lining suggestive of benign cyst, with  adjacent chronic inflammation, reactive fibrosis, and hemosiderin depostition B. Liver asparte fluid: hypocellular specimen. Rare benign hepatocysts and macrophages. No malignant cells identified.   . ESOPHAGOGASTRODUODENOSCOPY  03/11/2015   . COLONOSCOPY  05/31/2012    normal mucosa with internal hemorrhoids.    Aaron Aas BILE DUCT STENT PLACEMENT     . CHOLECYSTECTOMY     . HYSTERECTOMY     . TONSILLECTOMY AND ADENOIDECTOMY         Allergies: Ceftriaxone, Iodine, Iv contrast [contrast media], Latex, Metformin, Ativan [lorazepam], Diclofenac, Ibuprofen, Januvia [sitagliptin], Macrobid [nitrofurantoin], and Tylenol [acetaminophen].     Current Medications:     Current Outpatient Medications   Medication Sig   . aspirin 81 MG tablet Take 81 mg by mouth daily.   Aaron Aas atorvastatin (LIPITOR) 10 MG tablet Take 10 mg by mouth daily.   . Buprenorphine HCl (BELBUCA) 150 MCG FILM Suck 150 mcg in mouth 2 times daily.   . carvedilol (COREG)  25 MG tablet Take 25 mg by mouth 2 times daily (with meals).   . Cholecalciferol (VITAMIN D PO) Take 50,000 capsules by mouth once a week.     . clopidogrel (PLAVIX) 75 MG tablet Take 75 mg by mouth daily.   . cyclobenzaprine (FLEXERIL) 10 MG tablet    . diphenhydrAMINE (BENADRYL) 25 MG capsule Take 2 capsules (50 mg) by mouth every 6 hours as needed for Itching.   . ergocalciferol (VITAMIN D) 50000 UNIT capsule    . glucose blood (TRUE METRIX BLOOD GLUCOSE TEST) test strip    . hydrALAZINE (APRESOLINE) 10 MG tablet    . HYDROmorphone (DILAUDID) 2 MG tablet Take 4 mg by mouth every 6 hours as needed for Moderate Pain (Pain Score 4-6).    . Lancets (STERILANCE TL) MISC    . latanoprost (XALATAN) 0.005 % ophthalmic solution Place 1 drop into both eyes every evening.   Marland Kitchen levothyroxine (SYNTHROID) 100 MCG tablet Take 88 mcg by mouth daily.    Marland Kitchen lidocaine (XYLOCAINE) 5 % ointment    . nitroGLYcerin (NITROSTAT) 0.4 MG SL tablet 0.4 mg by Sublingual route every 5 minutes as needed for Chest Pain. up to 3  tabs per episode.   . ondansetron (ZOFRAN) 8 MG tablet Take 1 tablet (8 mg) by mouth every 8 hours as needed for Nausea/Vomiting.   . pantoprazole (PROTONIX) 40 MG tablet Take 40 mg by mouth daily.   . timolol hemihydrate (BETIMOL) 0.5 % ophthalmic solution Place 1 drop into both eyes 2 times daily. use in affected eye(s)   . zolpidem (AMBIEN) 5 MG tablet Take 5 mg by mouth nightly as needed for Insomnia.     No current facility-administered medications for this visit.       Family History:    Family History   Problem Relation Name Age of Onset   . Stroke Mother     . Heart Disease Mother     . Stroke Father     . Heart Disease Father       Family history of obstructive sleep apnea: brother    Social History:    Alcohol, tobacco, illicit drug use: denies    Physical Examination:  Vitals: Ht 5\' 5"  (1.651 m)   Wt 74.8 kg (165 lb)   LMP  (LMP Unknown)   BMI 27.46 kg/m     Exam:  - Gen: Well-appearing, no distress.    Significant Diagnostics:    No prior sleep studies are available.      Laboratory data reviewed:     Lab Results   Component Value Date    WBC 6.9 12/05/2019    RBC 4.69 12/05/2019    HGB 13.8 12/05/2019    HCT 42.3 12/05/2019    MCV 90 12/05/2019    PLT 229 12/05/2019     Lab Results   Component Value Date    NA 137 12/05/2019    K 4.5 12/05/2019    CL 97 12/05/2019    BUN 13 12/05/2019    CREAT 0.76 12/05/2019    GLU 234 (H) 12/05/2019     Lab Results   Component Value Date    TSH 2.08 10/10/2014       Assessment and Plan:    Tanasia Budzinski is a 69 year old female seen today as a new consultation at the request of Margarita Grizzle for the chief complaint of sleep disturbance evaluation.    Treatment of insomnia includes identification of  any underlying causes (e.g. Medications, medical conditions, poor sleep hygiene).   RF: uncontrolled DM, chronic pain on opiods, hypnotic medication dependence, ?sleep disordered breathing, other.    Patient has a long standing h/o sleep onset and maintenance  insomnia. Sleep maintenance is still of concern- despite ambien, diluadid and belbuca nightly.   I do believe uncontrolled DM and possibly sleep disordered breathing (central and obstructive) are contributing causes.    Overnight sleep study has been ordered.   Sleep hygiene discussed.  Advise against long term use of Ambien especially in combination with opiod medications. I can provide taper recommendations following sleep study and evaluate need for alternate sleep aid.   Needs to work on DM control to help reduce nocturia and sleep disturbances. Follow up with PCP  Follow up 2 weeks after study to review results.       I have reviewed the  pathogenesis of obstructive sleep apnea and the natural course of the disorder if left untreated. Treatment of OSA reduces risk of developing stroke, congestive heart failure, myocardial infarction and premature death.   Factors that may exacerbate OSA including weight gain, insufficient sleep, certain medications and alcohol consumption were discussed and behavioral modifications including weight loss and exercise were discussed.    The risks of hypersomnolence including drowsy driving were discussed    All questions were answered as able.  I spent more than 50% of our visit in education and counseling related to disease management and chart review for total time of 50 minutes.    Delynn Fill, MD, MBA  Diplomate in Sleep Medicine    Electronically signed by Delynn Fill, MD on February 04, 2020 10:32

## 2020-02-04 NOTE — Patient Instructions (Signed)
 Overnight sleep study has been ordered.   Sleep hygiene discussed.  Advise against long term use of Ambien. I can provide taper recommendations following sleep study and evaluate need for alternate sleep aid.   Work on DM control to help reduce nocturia and sleep disturbances.  Follow up 2 weeks after study to review results.     Preparing for your sleep study:   There are several things to be aware of as you prepare for your sleep study. Following these instructions will help to ensure clear, accurate results.  . Bathe or shower before you arrive for your study.  . Men please be clean shaven unless you have a beard or mustache.  . Please do not use any hairsprays, hair gels, face makeup, perfumes, colognes, or body lotions.  . Make sure to bring everything you would normally take to spend the night away from home.  [place] are required. Men may wear shorts.    On the day of your test, arrive tired.  Smoking and caffeinated beverages are prohibited, and napping is discouraged.  We will do our best to make you feel at ease. The sleep lab professionals are present in an adjacent room  and are able to answer questions or assist you with trips to the bathroom etc., during the night.  We encourage you to bring items from home that might help you relax.    Arrival & Testing  It is not necessary for you to arrive any earlier than your scheduled appointment time. When you arrive at  the sleep lab, a sleep technician will greet you. You will then be shown the sleep recording equipment,  given a chance to ask questions, and get ready for bed as you do at home.    In order to evaluate your sleep, it is important to examine what's happening to your brain, your heart, your  breathing, and your legs while you are sleeping. For most patients, the initial sleep study performed is called a diagnostic polysomnogram.    It is used to diagnose a variety of sleep disorders including sleep apnea and leg kicking. A video monitor  in  your room records any unusual movements or parasomnias and recordings are only kept when significant abnormalities are seen.  Your body's activities will be monitored by sensors that are applied to your head and skin by paste, tape or another adhesive. These sensors measure brainwave activity, eye movement, and limb movement which are then used to determine stages of sleep, the quality of sleep, and the causes for disturbances and/or awakenings that you might experience. Flexible belts around your chest and abdomen and sensors at your mouth and nose monitor your breathing. An oximeter on your finger measures the level of oxygen in your blood and your heart rate. A microphone records any snoring. None of these devices are painful and each is  designed to be as comfortable as possible. All of the electrode wires are gathered together so that you may  change positions almost as easily as you would at home.    Results  Technicians are not permitted to discuss your study results with you. The sleep study, its analysis and  interpretation are a complex process. Your results will be forwarded to your physician, and in many cases  you will be referred to our Sleep Medicine physician for further evaluation and management      Patient Checklist  Please bring the following  -Comfortable Sleepwear  (No rayon, satin or silky  material)  - Favorite pillow or blanket, if you choose although bedding and pillows are provided  - Toothbrush, toothpaste, comb or brush  - Clothes to wear home  - Your medications (No medications can be given out by the Sleep Lab staff)  - Reading material, Kindles or tablets  - Interpreter if English is not your primary  Language    You should be ready to go home the following morning around 6:30 AM.   If you are unable to keep this appointment or have questions we can be reached Monday - Friday from 8:30am -4:30pm  [phone]

## 2020-02-05 ENCOUNTER — Telehealth (INDEPENDENT_AMBULATORY_CARE_PROVIDER_SITE_OTHER): Payer: Self-pay | Admitting: Family Medicine

## 2020-02-05 NOTE — Telephone Encounter (Signed)
 02/05/2020    Cathy Lucas:    PocketDoc message sent to patient to call back and schedule " Sleep Study " with Sleep Center per plan from DOS 02/04/2020 .    Dorris Gaul FD

## 2020-02-06 ENCOUNTER — Telehealth (INDEPENDENT_AMBULATORY_CARE_PROVIDER_SITE_OTHER): Payer: Self-pay | Admitting: Family Medicine

## 2020-02-06 NOTE — Telephone Encounter (Signed)
 Pt called anxious regarding sleept study scheduled for next week. Claims medication that helps her sleep is almost out and that she needs a refill.     Called pt back, she reports the medication is AMBIEN.  She reports that her PCP prescribed this medication. I informed her she would need to return to prescribing doctor for a refill on this medication. Pt will call PCP//lz

## 2020-02-09 ENCOUNTER — Ambulatory Visit (INDEPENDENT_AMBULATORY_CARE_PROVIDER_SITE_OTHER)

## 2020-03-02 NOTE — Procedures (Signed)
 COMPREHENSIVE POLYSOMNOGRAM CLINICAL REPORT  Diagnostic Polysomnogram [phone])    Patient Data  Name: Cathy Lucas Referring Physician: Delynn Fill, MD   Age: 70 year Interpreting Physician: Delynn Fill, MD   Sex: Female Height: 5\' 5"  Epworth: 0   DOB: 1950-07-27 Weight: 165.0 lbs BMI: 27.5     Neck Size: 13.5 inches      Sleep Study Information  Date of Study: 02/09/2020 Acquisition Technician: L. Carlen Chasten, RPSGT   Date of Interpretation: 03/02/2020 Scoring Technician: L. Carlen Chasten, RPSGT     Study Start Time: 9:10:58 PM       SUMMARY AND CONCLUSIONS:    Impression:      1. This study shows mild obstructive sleep apnea: AHI 9.3. Snoring was observed.  2. The lowest desaturation was to 87.0%. TIB. 358.5.  3. Total time with oxygen saturation below 90% = was 7.4 minutes, or 2.1% of the recording time.  4. OSA was more severe in supine sleep and REM sleep. Supine AHI 21, REM sleep AHI 30  5. Sleep fragmentation index = 13.8/hr.  6. Periodic Leg Movements of Sleep (PLMS) occurred at a rate of 0 per hour (normal < 15/hr). The majority of these were not associated with cortical arousals with an arousal of 0 per hour.    Recommendations:    Mild positional OSA without hypoxemia.  Based on the results of this study, the patient would benefit from undergoing an attended CPAP titration study. Alternatively, oral appliance therapy and side sleeping may be considered for treatment of mild OSA. I will discuss the results of this study with Shelagh Derrick at follow up.                CLINICAL INFORMATION:    Referral Reason:  Disturbed sleep     History:  Back pain, Depression, Diabetes, Fibromyalgia, GERD, Glaucoma, Hypertension, Hypercholesterolemia, Hypothyroidism, Migraines, PAF      Medications:  Lipitor, Belbuca, Coreg, Vitamin D, Plavix, Flexeril, Benadryl, Apresoline, Dilaudid, Lancets, Xalatan, Synthroid, Xylocaine, Nitrostat, Zofran, Protonix, Betimol, Ambien    Sleep Medications:  Ambien    Post sleep study questionnaire:  On  the morning following the study, the patient completed a questionnaire, reporting that sleep the night of the study was worse than a typical night's sleep.    Technician's comments:  Scoring Technician: L. Carlen Chasten, RPSGT    The patient was scheduled for diagnostic sleep study for disturbed sleep. Light snoring was noted. Mild Obstructive Apnea/Hypopnea was observed, which was more severe during REM sleep (REM AHI = 30.). EKG showed normal sinus rhythm. Nocturia 0X.        POLYSOMNOGRAPHY DETAILS  Study set?up and analysis:    An overnight attended Polysomnogram was performed using the [facility] polysomnographic recording system. The following parameters were measured: Electroencephalogram (F3, O1, O2, C3, C4, derivations), right and left Electro~oculogram, Electromyogram (Chin and tibialis anterior), oral/nasal air flow (thermistor and pressure transducer), respiratory effort (thoracic and abdominal piezoelectric bands), snoring detection microphone, pulse oximetry, Electrocardiogram (Standard limb lead 2), and position sensor. The international 10?20 EEG electrode placement system was used. Standard impedance and bio~calibration checks were performed. The Electroencephalogram was analyzed using AASM criteria. Respiratory events were scored from the flow or effort channels if the duration was 10 seconds or more. Hypopneas were scored following guidelines provided by the American Academy  of Sleep Medicine and were defined as decrements in airflow greater than or equal to 30% but less than 90% from the immediate baseline associated with an  oxygen desaturation of 4% or more or an EEG arousal. Apneas were scored when the air flow decreased by 90% or more regardless of associated conditions. The apnea hypopnea index (AHI) was calculated using the total sleep time as the denominator.  Periodic limb movements and arousals were scored following standard criteria. The entire recording was reviewed  and interpreted by a board certified sleep disorders specialist.    Quality of data: The patient tolerated the polysomnography procedure well. Excellent data quality was obtained. No complications noted.    Sleep parameters: Total sleep time was 187.5 minutes.  Sleep efficiency was 52.3%.  Sleep latency to stage 1 sleep was 0 minutes. REM sleep latency was 271.5 minutes.   Times and Durations  Lights Off (LOFF): 11:01:30 PM   Sleep Onset (SO): 59.9   REM Latency (from SO): 271.5   Lights On: 04:59:59 AM     Total Recording Time (TIB): 358.5   Sleep Period Time (SPT): 298.8   Total Sleep Time (TST): 187.5     REM Duration: 24.0   NREM Duration: 163.5   Slow Wave Sleep Duration: 0     WASO: 111.5   Total Wake Time (TWK): 171.5     Sleep architecture: Stage 1 sleep at 28.5% (normal < 5%), Stage 2 sleep at 58.7%, Slow Wave Sleep at 0%. REM sleep was at 12.8%.  There were 3 REM periods totaling 24.0 of sleep. Wake time percent was 47.8% of total time in bed.  Electroencephalogram was within normal limits. There were no abnormal body movement events or vocalizations observed that were suggestive or a REM or NREM parasomnia. No abnormal behaviors observed overnight or during sleep. There were no suggestions of abnormal spike and wave on the EEG that suggests ictal (seizure) activities.  Sleep Staging Data  Sleep Stage Latency   (from lights off) % of TST Durations #Periods   N1 59.9 28.5% 53.5    N2 105.9  58.7% 110    N3 0 0% 0    R 331.4 12.8% 24.0 3     Sleep Efficiency: 52.3%      Body position analysis: The patient spent 79.5 minutes of the total sleep time in the supine position.  Body Position (entire night)  Position Duration  (min) Sleep (min) REM  (min) NREM (min) [place]  (#) OA  (#) MA  (#) HYP  (#) AHI  (#/hr) Desats  (#)   L 93.0 48.0 0 48.0 0 0 0 0 0 0   P 0 0 0 0 0 0 0 0 0 0    S 114.6 79.5 17.5 62.0 4 1 0 24 21.9 29   R 144.4 60 6.5 53.5 0 0 0 0 0 0   Up 6.5 0 0 0 0 0 0 0 0 0        Arousal analysis: Total  arousal index was 13.8.  Arousal Data   Count Index   Respiratory: 0 0   Leg Movements: 0 0   Spontaneous 43 13.8   Total arousals: 43 13.8     Leg movement analysis: 0 periodic limb movements were scored with an index of 0 per hour.   Leg Movements   Count Index   Total Leg Movements: 4 1.3   PLMS: 0 0   PLMS Arousals: 0 0       Respiratory analysis: Total AHI was at 9.3.  REM related AHI was 30. Supine AHI 21.9.  Cheyne Stokes breathing: was not observed.  Respiratory  Data   [place] OA MA Total Apnea Hypopnea* Apneas + Hypopneas   Number 4 1 0 5 24 29    Index (#/h TST) 1.3 0.3 0 1.6 7.7 9.3   *Hypopneas scored based on 4% or greater desaturation     REM NREM TST   AHI 30 6.2 9.3     Oxygenation and heart rate analysis: Average sleep saturation was 94.4%. Nadir saturation was 87.0%. Desaturation index was 9.3.  EKG showed normal sinus rhythm during sleep.  The mean heart rate was 76.4.  The maximum heart rate was 122.2, the minimum rate was 55.0. Atrial fibrillation was not observed. No other cardiac arrhythmias were observed.    Range (%) Time in range (min) Time in range (%)   0 0 90 7.4 2.1%   0 0 88.0 0.4 0.1%     Oximetry Summary   Wake REM NREM TOTAL   Number of Desaturations* 0 12 17 29    Desat Index (#/hour) 0 30 6.2 9.3   *Desaturations based on 4% or greater drop from baseline.  Minimum SpO2 value during sleep: 87.0%   Minimum SpO2 value associated with a respiratory event: 87.0%        Ladell Heads, MD, MBA  Diplomate in Sleep Medicine   Medical Director- [facility]     03/02/2020

## 2020-03-22 ENCOUNTER — Telehealth (HOSPITAL_COMMUNITY): Payer: Self-pay

## 2020-03-22 ENCOUNTER — Ambulatory Visit (INDEPENDENT_AMBULATORY_CARE_PROVIDER_SITE_OTHER): Payer: Medicare Other | Admitting: Cardiovascular Disease

## 2020-03-22 ENCOUNTER — Encounter (INDEPENDENT_AMBULATORY_CARE_PROVIDER_SITE_OTHER): Payer: Self-pay | Admitting: Cardiovascular Disease

## 2020-03-22 VITALS — BP 122/76 | HR 81 | Temp 97.3°F | Resp 18 | Ht 65.0 in | Wt 174.4 lb

## 2020-03-22 DIAGNOSIS — I10 Essential (primary) hypertension: Secondary | ICD-10-CM

## 2020-03-22 DIAGNOSIS — I480 Paroxysmal atrial fibrillation: Secondary | ICD-10-CM

## 2020-03-22 DIAGNOSIS — R7303 Prediabetes: Secondary | ICD-10-CM

## 2020-03-22 DIAGNOSIS — R590 Localized enlarged lymph nodes: Secondary | ICD-10-CM

## 2020-03-22 DIAGNOSIS — Z952 Presence of prosthetic heart valve: Secondary | ICD-10-CM

## 2020-03-22 DIAGNOSIS — R601 Generalized edema: Secondary | ICD-10-CM

## 2020-03-22 DIAGNOSIS — E785 Hyperlipidemia, unspecified: Secondary | ICD-10-CM

## 2020-03-22 LAB — ECG 12-LEAD
ATRIAL RATE: 77 {beats}/min
ECG INTERPRETATION: NORMAL
P AXIS: 36 degrees
PR INTERVAL: 144 ms
QRS INTERVAL/DURATION: 80 ms
QT: 408 ms
QTc (Bazett): 461 ms
R AXIS: 78 degrees
T AXIS: 80 degrees
VENTRICULAR RATE: 77 {beats}/min

## 2020-03-22 MED ORDER — MOVANTIK 25 MG PO TABS
25.00 mg | ORAL_TABLET | Freq: Every day | ORAL | Status: AC
Start: 2020-03-08 — End: ?

## 2020-03-22 MED ORDER — LOSARTAN POTASSIUM 25 MG OR TABS
12.5000 mg | ORAL_TABLET | Freq: Every day | ORAL | 3 refills | Status: DC
Start: 2020-03-22 — End: 2021-02-22

## 2020-03-22 MED ORDER — HYDROXYCHLOROQUINE SULFATE 200 MG OR TABS: 200.00 mg | ORAL_TABLET | Freq: Every day | ORAL | Status: AC

## 2020-03-22 MED ORDER — LIDOCAINE 5 % EX PTCH
MEDICATED_PATCH | CUTANEOUS | Status: AC
Start: 2020-03-08 — End: ?

## 2020-03-22 MED ORDER — HYDROMORPHONE HCL 4 MG OR TABS: 4.00 mg | ORAL_TABLET | Freq: Four times a day (QID) | ORAL | Status: AC | PRN

## 2020-03-22 MED ORDER — VITAMIN D 2000 UNIT OR TABS: 5000.00 [IU] | ORAL_TABLET | Freq: Every day | ORAL | Status: AC

## 2020-03-22 MED ORDER — LEVOTHYROXINE SODIUM 88 MCG OR TABS: 88.00 ug | ORAL_TABLET | Freq: Every day | ORAL | Status: AC

## 2020-03-22 NOTE — Patient Instructions (Addendum)
I will talk to Jackelyn Poling and see if you need to be still on the Plavix or transitioned to Warfarin.   Start Losartan 12.5 mg daily (1/2 tablet) daily.     Schedule an echo before your next visit.   Schedule a CT of your chest before next visit.   Wear event monitor for 2 weeks when you get it in the mail.     Give fasting blood work before your next visit.     Continue antibiotics before any dental work.

## 2020-03-22 NOTE — Telephone Encounter (Signed)
[employer] patch has been ordered for home delivery.  For more information regarding status and/or further instructions, call [employer] at [phone]

## 2020-03-22 NOTE — Progress Notes (Signed)
CARDIOLOGY CLINIC     Referring provider: Lavonia Drafts    CC: MVR     History of Presenting Illness:   Cathy Lucas is a 70 year old female with history of MVR who is here to establish care. Denies any SOB/DOE. She will get palpitations at times. Overall exercise tolerance is worse in the last year. She is limited by arthritis. She developed severe SOB/DOE after getting her the [place] 11/2019 and she became very short of breath. She is accompanied by her son today.     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 The left ventricular ejection fraction is 55-60% by   visual estimation.   Estimated pulmonary arterial pressure is normal.   The right ventricle is normal size. The right ventricular function is grossly normal.   There is a prosthetic mitral valve. SAM noted due to TAVR valve in mitral position. There   is trace mitral regurgitation.   There is mild tricuspid regurgitation.      ECHO 2018    1. The left ventricular size is normal and the left ventricular systolic function is normal.   2. The right ventricular size is mildly enlarged and systolic function is reduced.   3. S/p 26 mm Sapien 3 valve in the mitral position with mean gradient of 7 mm Hg.   4. Cannot estimate RVSP due to trace TR jet.   5. Compared to prior study 12/27/16 no change in mitral valve graidnet.     CTA Coronary 11/2016  1. No obstructive coronary artery disease.  2. 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  3. Bioprosthetic mitral valve abnormal valve leaflet motion. Internal  mitral annulus diameter is 22 x 22 mm.  4. Please see report for concurrently performed CT of the chest for discussion of extracardiac findings in the thorax.      Past Medical / Surgical History:  Past Medical History:   Diagnosis Date    Back pain     Needs L4 fusion    Depression     Diabetes (CMS-HCC)     Fibromyalgia     GERD (gastroesophageal reflux disease)     Glaucoma     Hypercholesterolemia     Hypertension     Hypothyroidism     Migraines     PAF (paroxysmal atrial fibrillation) (CMS-HCC)        Social History:  Social History     Socioeconomic History    Marital status: Married     Spouse name: Not on file    Number of children: Not on file    Years of education: Not on file    Highest education level: Not on file   Occupational History    Not on file   Tobacco Use    Smoking status: Never Smoker    Smokeless tobacco: Never Used   Substance and Sexual Activity    Alcohol  use: No    Drug use: No    Sexual activity: Not on file   Other Topics Concern    Caffeine Concern Not Asked   Social History Narrative    Not on file     Social Determinants of Health     Financial Resource Strain: Not on file   Food Insecurity: Not on file   Transportation Needs: Not on file   Physical Activity: Not on file   Stress: Not on file   Social Connections: Not on file   Intimate Partner Violence: Not on file   Housing Stability: Not on file       Family History:  Family History   Problem Relation Name Age of Onset    Stroke Mother      Heart Disease Mother      Stroke Father      Heart Disease Father         Medications (medication list reviewed by me with the patient):  Current Outpatient Medications   Medication Sig    aspirin 81 MG tablet Take 81 mg by mouth daily.    atorvastatin (LIPITOR) 10 MG tablet Take 20 mg by mouth daily.     Buprenorphine HCl (BELBUCA) 150 MCG FILM Suck 150 mcg in mouth 2 times daily.    carvedilol (COREG) 25 MG tablet Take 25 mg by mouth 2 times daily (with  meals).    Cholecalciferol (VITAMIN D PO) Take 50,000 Units by mouth once a week.     clopidogrel (PLAVIX) 75 MG tablet Take 75 mg by mouth daily.    cyclobenzaprine (FLEXERIL) 10 MG tablet Take 10 mg by mouth as needed.     diphenhydrAMINE (BENADRYL) 25 MG capsule Take 2 capsules (50 mg) by mouth every 6 hours as needed for Itching.    glucose blood (TRUE METRIX BLOOD GLUCOSE TEST) test strip     HYDROmorphone (DILAUDID) 4 MG tablet Take 4 mg by mouth 4 times daily as needed.    hydroxychloroquine (PLAQUENIL) 200 MG tablet Take 400 mg by mouth daily.    Lancets (STERILANCE TL) MISC     latanoprost (XALATAN) 0.005 % ophthalmic solution Place 1 drop into both eyes every evening.    levothyroxine (SYNTHROID) 88 MCG tablet Take 88 mcg by mouth every morning (before breakfast).    lidocaine (LIDODERM) 5 % patch     losartan (COZAAR) 25 MG tablet Take 0.5 tablets (12.5 mg) by mouth daily.    MOVANTIK 25 MG TABS tablet Take 25 mg by mouth daily.    nitroGLYcerin (NITROSTAT) 0.4 MG SL tablet 0.4 mg by Sublingual route every 5 minutes as needed for Chest Pain. up to 3 tabs per episode.    ondansetron (ZOFRAN) 8 MG tablet Take 1 tablet (8 mg) by mouth every 8 hours as needed for Nausea/Vomiting.    pantoprazole (PROTONIX) 40 MG tablet Take 40 mg by mouth daily.    timolol hemihydrate (BETIMOL) 0.5 % ophthalmic solution Place 1 drop into both eyes 2 times daily. use in affected eye(s)    vitamin D3 (VITAMIN D) 50 MCG (2000 UT) tablet Take 2,000 Units by mouth daily.    zolpidem (AMBIEN) 5 MG tablet Take 3.5 mg by mouth nightly as needed for Insomnia.      No current facility-administered medications for this visit.       Allergies:  Patient is allergic to ceftriaxone, iodine, iv contrast [contrast media], latex, metformin, ativan [lorazepam], definity [perflutren lipid microsphere], diclofenac, ibuprofen, [place] [  sitagliptin], macrobid [nitrofurantoin], and tylenol [acetaminophen].    Physical Exam:  BP  122/76 (BP Location: Right arm, BP Patient Position: Sitting, BP cuff size: Regular)    Pulse 81    Temp 97.3 F (36.3 C) (Temporal)    Resp 18    Ht _0  (1.651 m)    Wt 79.1 kg (174 lb 6.4 oz)    LMP  (LMP Unknown)    SpO2 97%    BMI 29.02 kg/m  /    General: awake, alert, no acute distress  HEENT: NCAT, PERRL, EOMI  Neck: JVP ~7cm, no carotid bruits  Lungs: clear to auscultation bilaterally, no wheezing/crackles  Heart: RRR, nl S1S2, no murmurs, rubs or gallops,healed sternotomy   Abdomen: +BS, soft, non-tender, non-distended  Extremities: warm, well-perfused, no edema,  2+ symmetric distal pulses  Skin: dry, no rashes  Neuro: alert and oriented x3, no focal neurologic deficit    Lab Data (reviewed by me today):  Lab Results   Component Value Date    BUN 13 12/05/2019    CREAT 0.76 12/05/2019    CL 97 12/05/2019    NA 137 12/05/2019    K 4.5 12/05/2019    [place] 9.8 12/05/2019    TBILI 0.5 12/05/2019    ALB 4.7 12/05/2019    TP 7.2 12/05/2019    AST 14 12/05/2019    ALK 109 12/05/2019    BICARB 24 12/05/2019    ALT 8 12/05/2019    GLU 234 (H) 12/05/2019     Lab Results   Component Value Date    WBC 6.9 12/05/2019    RBC 4.69 12/05/2019    HGB 13.8 12/05/2019    HCT 42.3 12/05/2019    MCV 90 12/05/2019    MCHC 32.6 12/05/2019    RDW 12.7 12/05/2019    PLT 229 12/05/2019    MPV 11.4 11/20/2018     No results found for: CHOL, HDL, LDLCALC, TRIG, LDLDIRECT  Lab Results   Component Value Date    TSH 2.08 10/10/2014     Assessment/Plan:  70 year old female with endocarditis s/p MVR who is here to establish care:     1. Endocarditis:   - s/p bovine bioprosthetic MVR in 2012 s/p Sapien 3 mitral valve placement 2018   - She is taking Plavix and is no longer on warfarin.   - Will need to discuss with interventional as patient should likely be taking Warfarin     2. PAF:   - LAA Closure + MAZE (unclear benefit of anticoagulation).   - Currently in sinus rhythm.   - Continue Coreg 25 mg BID.     3. HTN: uncontrolled.   - Will  add losartan 12.5 mg daily + Coreg 25 mg BID.,     4. Dyslipidemia:   - Goal LDL <70, continue Atorvastatin 10 mg daily.     5. Hilar lymphadenopathy:   - Unclear etiology also with possible history of PAH. Will assess with CT Chest.     Patient Instructions   I will talk to [facility] and see if you need to be still on the Plavix or transitioned to Warfarin.   Start Losartan 12.5 mg daily (1/2 tablet) daily.     Schedule an echo before your next visit.   Schedule a CT of your chest before next visit.   Wear event monitor for 2 weeks when you get it in the mail.     Give fasting blood work before  your next visit.     Continue antibiotics before any dental work.         Return in about 3 months (around 06/19/2020).    My above recommendations will be communicated with the referring physician by way of letter or the electronic medical record.    Please don't hesitate to page me should you have any questions regarding the care of this patient.    Curt Bears MD MPH  Assistant Professor of [place] of [facility]  [phone] [facility] office)  [phone] ([place] office)  [phone] (fax)  [facility]in[facility] Masterson_0 .Bennie Pierini

## 2020-03-31 ENCOUNTER — Encounter (INDEPENDENT_AMBULATORY_CARE_PROVIDER_SITE_OTHER): Payer: Self-pay | Admitting: Family Medicine

## 2020-03-31 ENCOUNTER — Telehealth (INDEPENDENT_AMBULATORY_CARE_PROVIDER_SITE_OTHER): Admitting: Family Medicine

## 2020-03-31 VITALS — Ht 65.0 in | Wt 172.0 lb

## 2020-03-31 MED ORDER — AIRBORNE ELDERBERRY PO: ORAL | Status: AC

## 2020-03-31 MED ORDER — MELATONIN 5 MG PO TBDP: ORAL_TABLET | ORAL | Status: AC

## 2020-03-31 NOTE — Patient Instructions (Signed)
 As discussed your sleep study showed obstructive sleep apnea.    An order for a continuous positive airway pressure (PAP) unit has been placed to a Durable Medical Equipment (DME) provider that your insurance will allow you to work with.     Don't be discouraged if it takes some time to get used to using the CPAP mask. One way to help make it easier is to try using it while doing some distracting activity before bed, like watching TV. The device also has a "ramp" feature where the initial pressure is on the lower side, and builds up over a half hour or so to get you acclimated to it. If you have any issues, let us know.     Insurance companies require minimum PAP usage of greater than 4 hours per night, 70% of nights per week and follow up with a sleep provider between 31-90 days after receiving your PAP unit showing this use on your data card/chip or modem. If you do not meet these requirements, your insurance will not cover the costs of your PAP unit and supplies. Please bring your data card/chip to future sleep clinic visits.    Follow up in 8-10 weeks with machine and power cord

## 2020-03-31 NOTE — Interdisciplinary (Signed)
I, Nizar Cutler, have reviewed the patient's medications and allergies.

## 2020-03-31 NOTE — Progress Notes (Signed)
 ---------------------(data below generated by Ladell Heads, MD)--------------------    Patient Verification & Telemedicine Consent:    I am proceeding with this evaluation at the direct request of the patient.  I have verified this is the correct patient and have obtained verbal consent and written consent from the patient/ surrogate to perform this voluntary telemedicine evaluation (including obtaining history, performing examination and reviewing data provided by the patient).   The patient/surrogate has the right to refuse this evaluation.  I have explained risks (including potential loss of confidentiality), benefits, alternatives, and the potential need for subsequent face to face care. Patient/surrogate understands that there is a risk of medical inaccuracies given that our recommendations will be made based on reported data (and we must therefore assume this information is accurate).  Knowing that there is a risk that this information is not reported accurately, and that the telemedicine video, audio, or data feed may be incomplete, the patient agrees to proceed with evaluation and holds [place] harmless knowing these risks. All laws concerning confidentiality and patient access to medical records and copies of medical records apply to telemedicine.  The patient/surrogate has received [facility] Notice of Privacy Practices.  I have reviewed this above verification and consent paragraph with the patient/surrogate.  If the patient is not capacitated to understand the above, and no surrogate is available, since this is not an emergency evaluation, the visit will be rescheduled until such time that the patient can consent, or the surrogate is available to consent.    Demographics:  Medical Record #: [id number]   Date: March 31, 2020   Patient Name: Cathy Lucas   DOB: 1950/07/03  Age: 70 year old  Sex: female  Location: Home address on file      Evaluator(s):   Cathy Lucas was evaluated by  me today.    Clinic Location:  [facility] [place]  [facility] [place]  [address], [address]  [place] [address]    Sleep Medicine Clinic Progress Note    Interval history:  70 year old female presenting for follow-up of her recent sleep study. States sleep remains disrupted. Working on tapering [employer]. Has decreased dose to 5mg  and added melatonin 5mg  with benefit in sleep onset. Awakens 2/2 nocturia. Working on BG control.     Initially seen in the [facility] Medicine Center on 12/21 with complaints of insomnia evaluation.  Patient has been on Ambien 10mg  nightly x 20 years. She states she was started on [place] as she had trouble staying asleep 2/2 pain from RA and back pain. She states this is the only sleep aid she has been prescribed. With [place]- she denies trouble falling asleep, but continues to have frequent awakenings 2/2 urination, pain and waking up to reposition. She states she has prediabetes, although HbA1C 10/2019 was 9.6. She states she has lowered it to 7.2. Currently not on treatment for DM. She is on long standing opiod medications for pain- belbuca BID, diluadid TID to QID and prn flexeril. No prior sleep study or sleep evaluation. She presents today to discuss her sleep aid.  Sleep wake schedule:  Patient's time to bed is 10-11p and wake up time is 7-10a. It takes her 30 minutes to fall asleep- with ambien. She has 4-5 nocturnal awakenings due to urination, to reposition. It takes a few minutes to fall back asleep. She does maintain same schedule on weekends. Cathy Lucas denies taking scheduled naps. denies prior shift work history. Marland Kitchen  Past Medical History:   Diagnosis Date   . Back pain     Needs L4 fusion   . Depression    . Diabetes (CMS-HCC)    . Fibromyalgia    . GERD (gastroesophageal reflux disease)    . Glaucoma    . Hypercholesterolemia    . Hypertension    . Hypothyroidism    . Migraines    . PAF (paroxysmal atrial fibrillation) (CMS-HCC)      Past Surgical History:    Procedure Laterality Date   . MITRAL VALVE REPAIR  12/26/2016    TMVR   . LIVER BIOPSY  05/22/2015    steatohepatitis with periportal and focal septal fibrosis(stage 2-3 of 4). Small focus of benign epithelial lining suggestive of benign cyst, with adjacent chronic inflammation, reactive fibrosis, and hemosiderin depostition B. Liver asparte fluid: hypocellular specimen. Rare benign hepatocysts and macrophages. No malignant cells identified.   . ESOPHAGOGASTRODUODENOSCOPY  03/11/2015   . COLONOSCOPY  05/31/2012    normal mucosa with internal hemorrhoids.    Marland Kitchen BILE DUCT STENT PLACEMENT     . CHOLECYSTECTOMY     . HYSTERECTOMY     . TONSILLECTOMY AND ADENOIDECTOMY       Family History   Problem Relation Name Age of Onset   . Stroke Mother     . Heart Disease Mother     . Stroke Father     . Heart Disease Father       Social History     Tobacco Use   . Smoking status: Never Smoker   . Smokeless tobacco: Never Used   Substance Use Topics   . Alcohol use: No   . Drug use: No       Vitals:  Ht 5\' 5"  (1.651 m)   Wt 78 kg (172 lb)   LMP  (LMP Unknown)   BMI 28.62 kg/m     Exam:  - Gen: Well-appearing, no distress.    Significant Diagnostics:    In-laboratory polysomnography (02/09/20) - The images were personally reviewed -   SUMMARY AND CONCLUSIONS:    Impression:      1. This study shows mild obstructive sleep apnea: AHI 9.3. Snoring was observed.  2. The lowest desaturation was to 87.0%. TIB. 358.5.  3. Total time with oxygen saturation below 90% = was 7.4 minutes, or 2.1% of the recording time.  4. OSA was more severe in supine sleep and REM sleep. Supine AHI 21, REM sleep AHI 30  5. Sleep fragmentation index = 13.8/hr.  6. Periodic Leg Movements of Sleep (PLMS) occurred at a rate of 0 per hour (normal < 15/hr). The majority of these were not associated with cortical arousals with an arousal of 0 per hour.    Recommendations:    Mild positional OSA without hypoxemia.  Based on the results of this study, the  patient would benefit from undergoing an attended CPAP titration study. Alternatively, oral appliance therapy and side sleeping may be considered for treatment of mild OSA. I will discuss the results of this study with Byrd Hesselbach at follow up.       Assessment:  Patient had a PSG which showed mild positional obstructive sleep apnea without hypoxemia.    No flowsheet data found.    I have reviewed these findings with the patient and explained all terms as well as the  pathogenesis of OSA and the natural course of the disorder if left untreated.  I discussed the following possible sequelae:  Hypertension, cardiovascular disease, including stroke, cardiac arrhythmias, diabetes and obesity. Factors that may exacerbate OSA including weight gain, insufficient sleep, certain medications and alcohol consumption were discussed and behavioral modifications including weight loss and exercise were discussed. I have urged the patient to follow through with treatment.    I discussed the treatment options including:  PAP, oral appliance and positional therapy. Surgery and hypoglossal nerve stimulation will be discussed in more detail should conservative measures fail.    DME order placed for APAP 4-15cm H2O.   Follow up in 1 month after starting PAP. Patient has been counseled about  avoiding drowsy driving.    I have spent more than 50% of the visit in education and counseling for a total time of 15 minutes        Ladell Heads, MD, MBA  Diplomate in Sleep Medicine    Electronically signed by Ladell Heads, MD on March 31, 2020 14:16

## 2020-04-01 ENCOUNTER — Telehealth (INDEPENDENT_AMBULATORY_CARE_PROVIDER_SITE_OTHER): Payer: Self-pay | Admitting: Family Medicine

## 2020-04-01 NOTE — Telephone Encounter (Signed)
 04/01/2020    Kinjal Madhav:    PocketDoc message sent to patient to call back and schedule " Follow up in 8-10 weeks with machine and power cord " with K. Madhav per plan from DOS 03/31/2020 .      Minerva Ends FD

## 2020-04-02 ENCOUNTER — Telehealth (INDEPENDENT_AMBULATORY_CARE_PROVIDER_SITE_OTHER): Payer: Self-pay | Admitting: Family Medicine

## 2020-04-02 NOTE — Telephone Encounter (Signed)
 Faxed Sleep study. [facility]:   [phone]//lz

## 2020-04-06 IMAGING — MR RM COLUNA LOMBAR
4 of 5 series · 25 of 48 positions shown · non-contrast
Comparison: none

[Series 301: T2 · sagittal · 4.0mm · 0.66mm/px · 6 of 12 slices shown (1 of 3)]
[im 1/12]
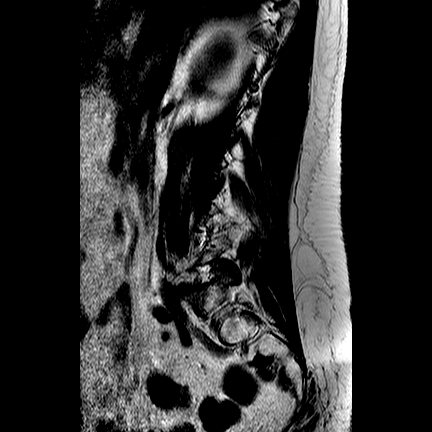
[im 3/12]
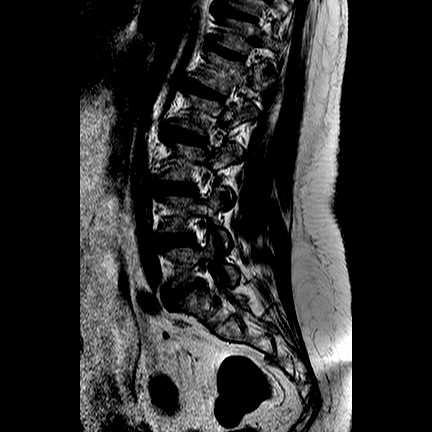
[im 5/12]
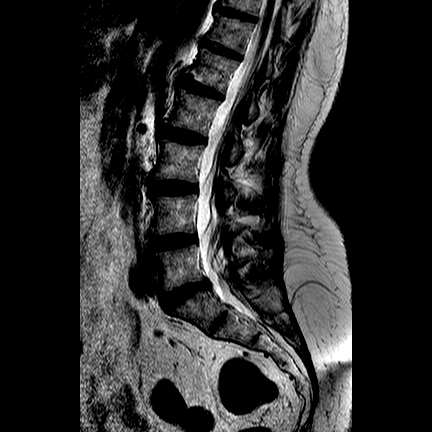
[im 7/12]
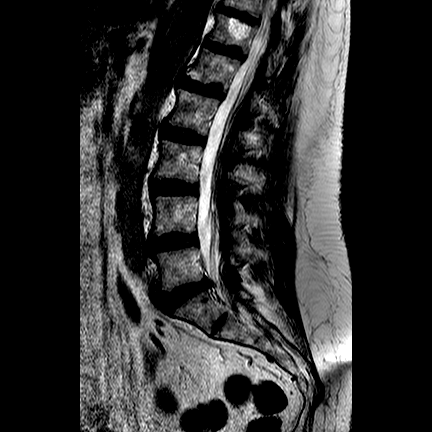
[im 9/12]
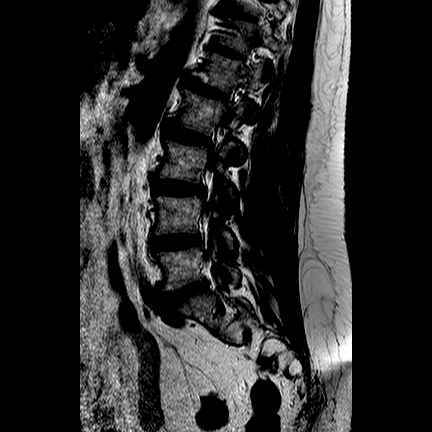
[im 12/12]
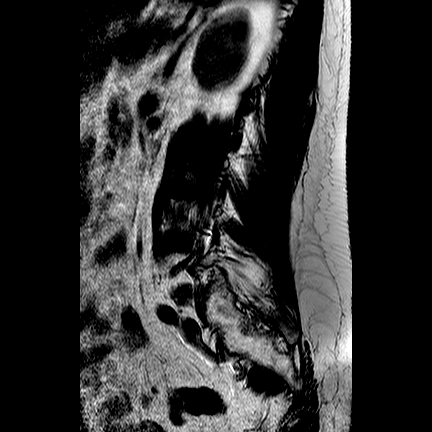

[Series 401: T2 · coronal · 4.0mm · 0.67mm/px · 7 of 13 slices shown (2 of 3)]
[im 1/13]
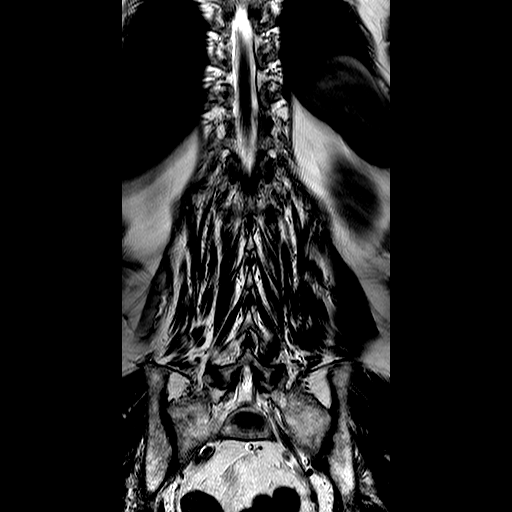
[im 3/13]
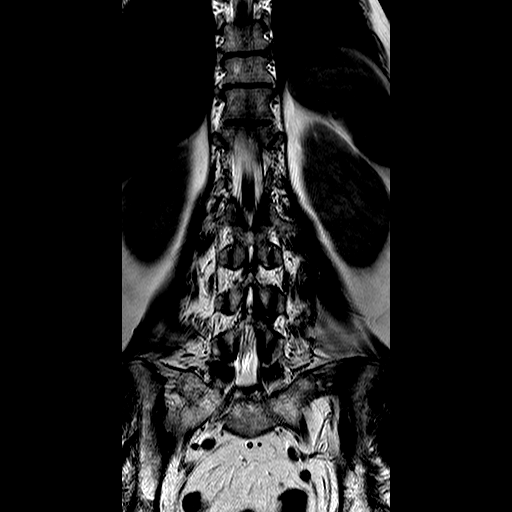
[im 5/13]
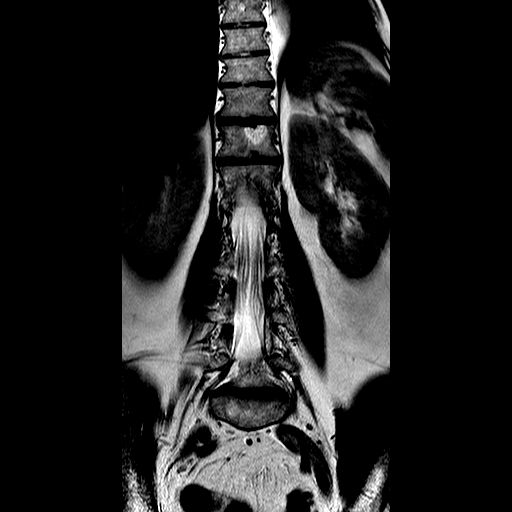
[im 7/13]
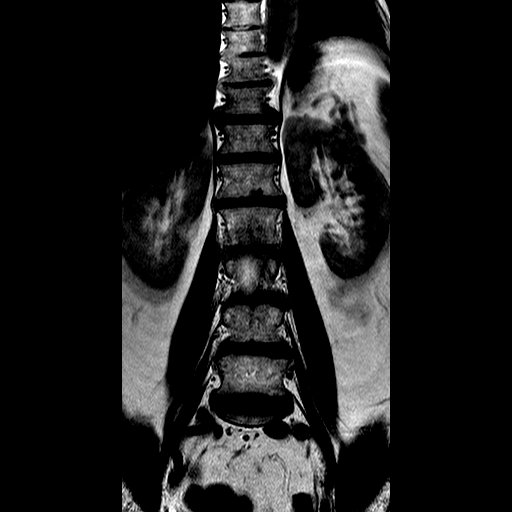
[im 9/13]
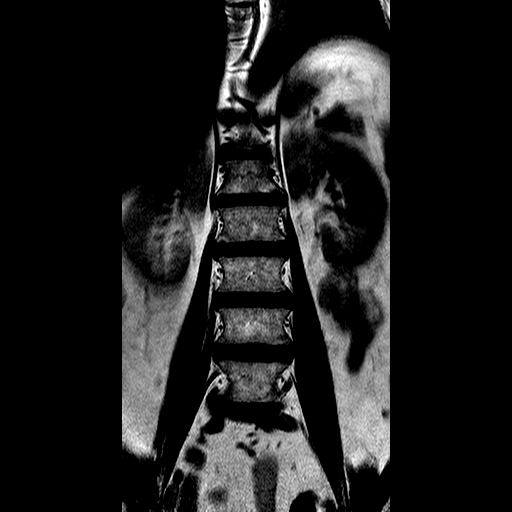
[im 11/13]
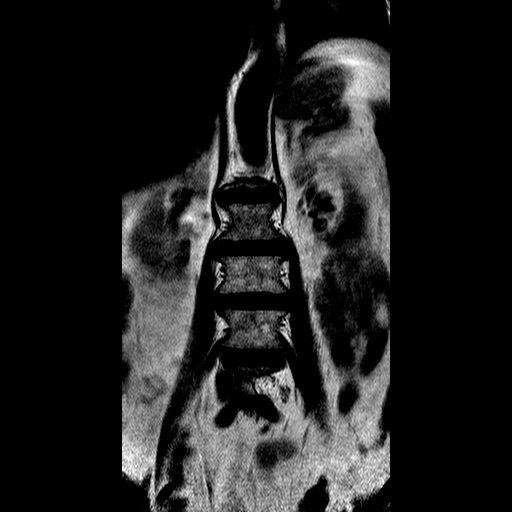
[im 13/13]
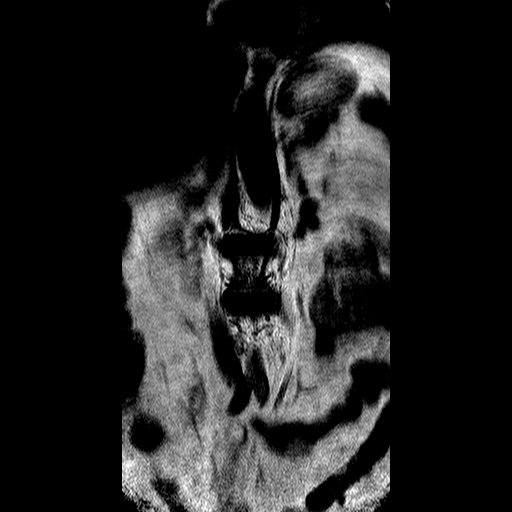

[Series 501: T1 · sagittal · 4.0mm · 0.56mm/px · 3 of 12 slices shown]
[im 2/12]
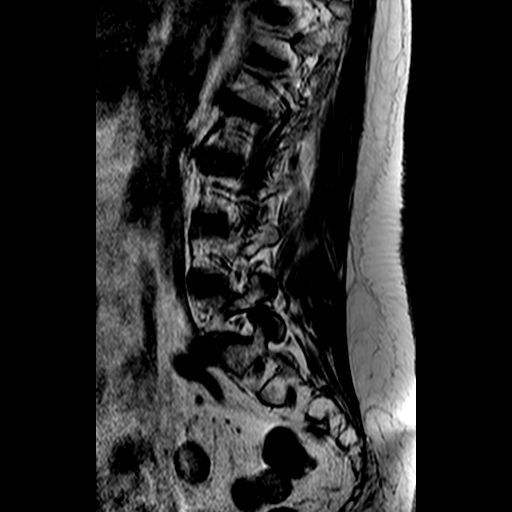
[im 6/12]
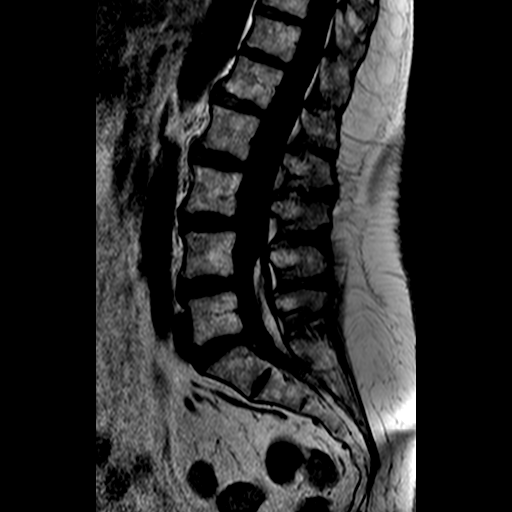
[im 10/12]
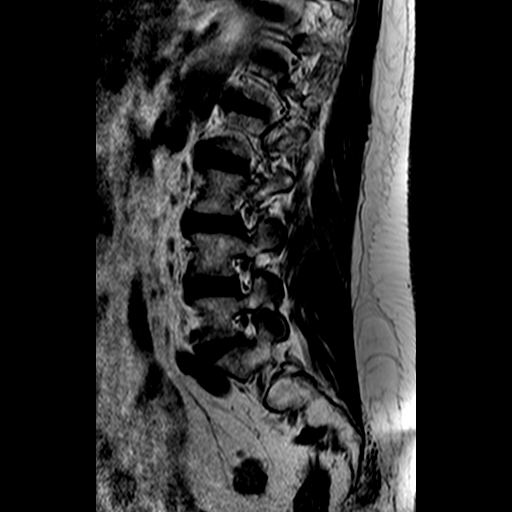

[Series 701: T2 · axial · 4.0mm · 0.45mm/px · z∈[-96,+54]mm · 9 of 38 slices shown (3 of 3)]
[im 2/38]
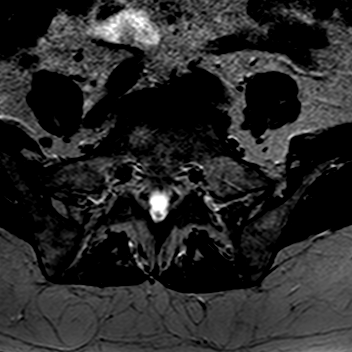
[im 6/38]
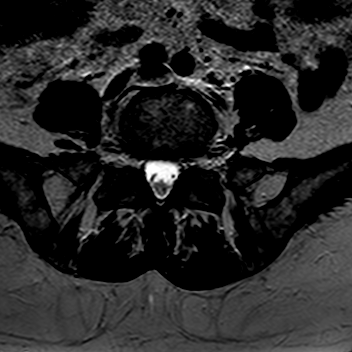
[im 12/38]
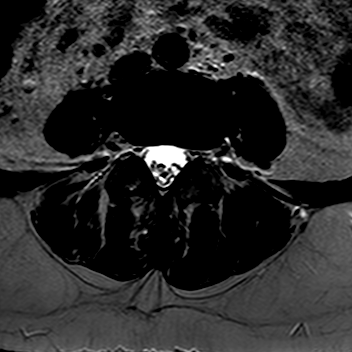
[im 17/38]
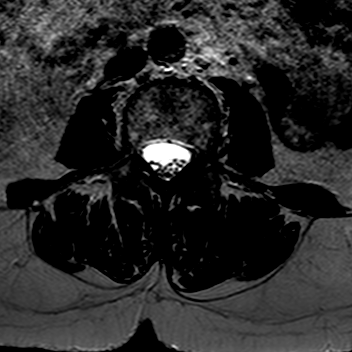
[im 19/38]
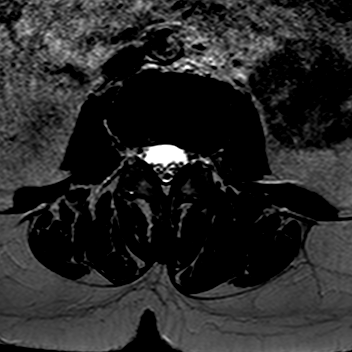
[im 21/38]
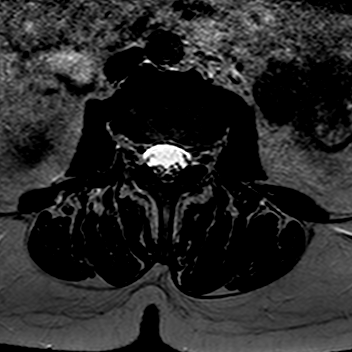
[im 26/38]
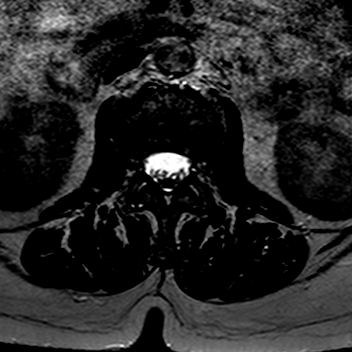
[im 32/38]
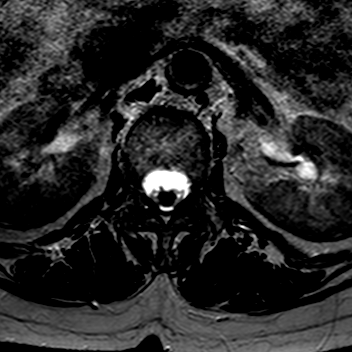
[im 36/38]
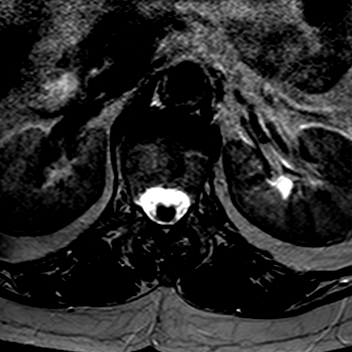

[25 of 48 positions shown; findings below may reference images not displayed]

INDICAÇÃO CLÍNICA:
Lombociatalgia.
METODOLOGIA: 
Exame  realizado  com  sequências  ponderadas  em  T1  e  T2,  sem  a  administração  intravenosa  do  agente  de  contraste 
paramagnético.
RESSONÂNCIA MAGNÉTICA DA COLUNA LOMBOSSACRA 
ANÁLISE:
Acentuação da curvatura lombar fisiológica.
Os corpos vertebrais apresentam altura e alinhamento posterior preservados.
A primeira peça sacral é hipoplásica (variação anatômica).
Hemangioma ósseo envolvendo o corpo vertebral T12.
Nódulos de Schmörl com aspecto crônico nos platôs vertebrais inferiores de T11, T12 e L1.
Reações osteofitárias marginais anteriores.
Desidratação discal difusa.
Discreta protrusão discal posterior em L5-S1 que retifica a face ventral do saco dural.
Demais interespaços discais analisados sem evidência de protrusões discais significativas, sejam elas focais ou difusas.
Sinais de artrose facetária bilateralmente em L3-L4, L4-L5 e L5-S1.
Forames de conjugação livres.
O canal vertebral exibe de dimensões normais por toda extensão avaliada.
Lipoma de fillum terminal localizado no segmento compreendido entre L4 e S2 (sem significado clínico).
O cone medular é tópico, sendo de calibre e intensidade de sinal normais.
Atrofia / lipossubstituição na musculatura paravertebral posterior baixa.
IMPRESSÃO:
Avaliação por ressonância magnética da coluna lombossacra evidenciando:
Sinais de espondilodiscopatia degenerativa.
Discreta protrusão discal posterior em L5-S1 que retifica a face ventral do saco dural.

Demais interespaços discais analisados sem evidência de protrusões discais significativas, sejam elas focais ou difusas.
Sinais de artrose facetária bilateralmente em L3-L4, L4-L5 e L5-S1.
Lipoma de fillum terminal localizado no segmento compreendido entre L4 e S2 (sem significado clínico).
  Se  você  não  for  destinatário,  saiba  que  qualquer  divulgação,  cópia,  distribuição  ou  utilização  do  conteúdo  dessas 
informações é proibido e passível de punição dentro da lei.

## 2020-04-08 ENCOUNTER — Telehealth (INDEPENDENT_AMBULATORY_CARE_PROVIDER_SITE_OTHER): Payer: Self-pay | Admitting: Cardiovascular Disease

## 2020-04-08 ENCOUNTER — Other Ambulatory Visit (INDEPENDENT_AMBULATORY_CARE_PROVIDER_SITE_OTHER): Payer: Self-pay | Admitting: Cardiovascular Disease

## 2020-04-08 DIAGNOSIS — I509 Heart failure, unspecified: Secondary | ICD-10-CM

## 2020-04-08 MED ORDER — CARVEDILOL 25 MG OR TABS
25.0000 mg | ORAL_TABLET | Freq: Two times a day (BID) | ORAL | 3 refills | Status: DC
Start: 2020-04-08 — End: 2021-03-13

## 2020-04-08 NOTE — Telephone Encounter (Signed)
refill encounter created.

## 2020-04-08 NOTE — Telephone Encounter (Signed)
RX REFILL REQUEST  Person requesting (Name): Self           Medication/s Name:carvedilol (COREG) 25 MG tablet   Pharmacy: [facility] on [address]  Is patient out of meds? Yes  Last clinic appt: 03/22/20  Future clinic appt:07/05/20  Appt made or offered:---  Comments:    Patient states that this was previously prescribed for her by outside cardiologist.  States she is out of medication tomorrow & is asking if Dr. Crisoforo Oxford can please order this for her asap.  Says she takes one 74m tablet twice daily.         Documentation

## 2020-04-08 NOTE — Telephone Encounter (Signed)
RX REFILL REQUEST  Person requesting (Name): Self   Medication/s Name:carvedilol (COREG) 25 MG tablet   Pharmacy: [facility] on [address]  Is patient out of meds? Yes  Last clinic appt: 03/22/20  Future clinic appt:07/05/20  Appt made or offered:---  Comments:    Patient states that this was previously prescribed for her by outside cardiologist.  States she is out of medication tomorrow & is asking if Dr. Crisoforo Oxford can please order this for her asap.  Says she takes one 33m tablet twice daily.

## 2020-04-16 ENCOUNTER — Ambulatory Visit (INDEPENDENT_AMBULATORY_CARE_PROVIDER_SITE_OTHER): Payer: Medicare Other

## 2020-04-16 DIAGNOSIS — Z952 Presence of prosthetic heart valve: Secondary | ICD-10-CM

## 2020-04-19 LAB — 2D ECHO WITH IMAGE ENHANCEMENT AGENT IF NECESSARY
IVC Diameter: 1.23 cm
LA Volume Index: 22.6 ml/m²
LV Ejection Fraction: 64 %
Mitral Mean Gradient: 7.3 mmHg
Mitral Valve Area: 0.6 cm2
PA Pressure: 24 mmHg

## 2020-05-18 ENCOUNTER — Ambulatory Visit
Admit: 2020-05-18 | Discharge: 2020-05-18 | Disposition: A | Discharge status: Home / Self Care | Payer: Medicare Other | Attending: Cardiovascular Disease | Admitting: Cardiovascular Disease

## 2020-05-18 DIAGNOSIS — I480 Paroxysmal atrial fibrillation: Secondary | ICD-10-CM

## 2020-07-02 ENCOUNTER — Other Ambulatory Visit (INDEPENDENT_AMBULATORY_CARE_PROVIDER_SITE_OTHER)
Admission: RE | Admit: 2020-07-02 | Discharge: 2020-07-02 | Disposition: A | Discharge status: Home / Self Care | Payer: Medicare Other | Attending: Cardiovascular Disease | Admitting: Cardiovascular Disease

## 2020-07-02 DIAGNOSIS — R601 Generalized edema: Secondary | ICD-10-CM | POA: Insufficient documentation

## 2020-07-02 DIAGNOSIS — I10 Essential (primary) hypertension: Secondary | ICD-10-CM

## 2020-07-02 DIAGNOSIS — Z952 Presence of prosthetic heart valve: Secondary | ICD-10-CM | POA: Insufficient documentation

## 2020-07-02 DIAGNOSIS — I480 Paroxysmal atrial fibrillation: Secondary | ICD-10-CM | POA: Insufficient documentation

## 2020-07-02 DIAGNOSIS — E785 Hyperlipidemia, unspecified: Secondary | ICD-10-CM | POA: Insufficient documentation

## 2020-07-02 DIAGNOSIS — R7303 Prediabetes: Secondary | ICD-10-CM

## 2020-07-02 LAB — COMPREHENSIVE METABOLIC PANEL, BLOOD
ALT (SGPT): 9 U/L (ref 0–33)
AST (SGOT): 12 U/L (ref 0–32)
Albumin: 4.4 g/dL (ref 3.5–5.2)
Alkaline Phos: 92 U/L (ref 35–104)
Anion Gap: 11 mmol/L (ref 7–15)
BUN: 17 mg/dL (ref 8–23)
Bicarbonate: 24 mmol/L (ref 22–29)
Bilirubin, Tot: 0.3 mg/dL (ref <1.2)
Calcium: 10 mg/dL (ref 8.5–10.6)
Chloride: 103 mmol/L (ref 98–107)
Creatinine: 0.74 mg/dL (ref 0.51–0.95)
GFR: >60 mL/min
Glucose: 137 mg/dL — ABNORMAL HIGH (ref 70–99)
Potassium: 4.2 mmol/L (ref 3.5–5.1)
Sodium: 138 mmol/L (ref 136–145)
Total Protein: 7.1 g/dL (ref 6.0–8.0)
eGFR Based on CKD-EPI 2021 Equation: >60 mL/min

## 2020-07-02 LAB — CBC WITH DIFF, BLOOD
ANC-Automated: 4.1 10*3/uL (ref 1.6–7.0)
Abs Basophils: 0 10*3/uL (ref <0.1)
Abs Eosinophils: 0.2 10*3/uL (ref 0.0–0.5)
Abs Lymphs: 2.2 10*3/uL (ref 0.8–3.1)
Abs Monos: 0.7 10*3/uL (ref 0.2–0.8)
Basophils: 1 %
Eosinophils: 3 %
Hct: 41.1 % (ref 34.0–45.0)
Hgb: 13.4 gm/dL (ref 11.2–15.7)
Lymphocytes: 31 %
MCH: 28.9 pg (ref 26.0–32.0)
MCHC: 32.6 g/dL (ref 32.0–36.0)
MCV: 88.6 um3 (ref 79.0–95.0)
MPV: 10.5 fL (ref 9.4–12.4)
Monocytes: 10 %
Plt Count: 218 10*3/uL (ref 140–370)
RBC: 4.64 10*6/uL (ref 3.90–5.20)
RDW: 14.6 % — ABNORMAL HIGH (ref 12.0–14.0)
Segs: 56 %
WBC: 7.3 10*3/uL (ref 4.0–10.0)

## 2020-07-02 LAB — LIPID(CHOL FRACT) PANEL, BLOOD
Cholesterol: 136 mg/dL (ref <200)
HDL-Cholesterol: 50 mg/dL
LDL-Chol (Calc): 61 mg/dL (ref <160)
Non-HDL Cholesterol: 86 mg/dL
Triglycerides: 125 mg/dL (ref 10–170)

## 2020-07-02 LAB — TSH, BLOOD: TSH: 8.82 u[IU]/mL — ABNORMAL HIGH (ref 0.27–4.20)

## 2020-07-02 LAB — GLYCOSYLATED HGB(A1C), BLOOD: Glyco Hgb (A1C): 7.8 % — ABNORMAL HIGH (ref 4.8–5.8)

## 2020-07-02 LAB — PRO BNP, BLOOD: BNPP: 165 pg/mL (ref 0–899)

## 2020-07-02 LAB — CRP HS, BLOOD: CRP HS: 0.8 mg/L (ref 0.0–4.9)

## 2020-07-05 ENCOUNTER — Encounter (INDEPENDENT_AMBULATORY_CARE_PROVIDER_SITE_OTHER): Payer: Self-pay | Admitting: Cardiovascular Disease

## 2020-07-05 ENCOUNTER — Ambulatory Visit (INDEPENDENT_AMBULATORY_CARE_PROVIDER_SITE_OTHER): Payer: Medicare Other | Admitting: Cardiovascular Disease

## 2020-07-05 VITALS — BP 138/78 | HR 80 | Temp 97.3°F | Resp 16 | Ht 65.0 in | Wt 178.2 lb

## 2020-07-05 DIAGNOSIS — R591 Generalized enlarged lymph nodes: Secondary | ICD-10-CM

## 2020-07-05 DIAGNOSIS — I050 Rheumatic mitral stenosis: Secondary | ICD-10-CM

## 2020-07-05 DIAGNOSIS — N3090 Cystitis, unspecified without hematuria: Secondary | ICD-10-CM

## 2020-07-05 DIAGNOSIS — E119 Type 2 diabetes mellitus without complications: Secondary | ICD-10-CM

## 2020-07-05 DIAGNOSIS — I10 Essential (primary) hypertension: Secondary | ICD-10-CM

## 2020-07-05 DIAGNOSIS — I480 Paroxysmal atrial fibrillation: Secondary | ICD-10-CM

## 2020-07-05 DIAGNOSIS — Z952 Presence of prosthetic heart valve: Secondary | ICD-10-CM

## 2020-07-05 MED ORDER — EMPAGLIFLOZIN 10 MG PO TABS
10.0000 mg | ORAL_TABLET | Freq: Every day | ORAL | 3 refills | Status: DC
Start: 2020-07-05 — End: 2021-03-29

## 2020-07-05 MED ORDER — CIPROFLOXACIN HCL 500 MG OR TABS
500.0000 mg | ORAL_TABLET | Freq: Two times a day (BID) | ORAL | 1 refills | Status: DC
Start: 2020-07-05 — End: 2021-12-27

## 2020-07-05 NOTE — Patient Instructions (Addendum)
Start Jardiance 10 mg daily, if you are sick and not eating, then make sure to hold this medication.   If you get an UTI (not common) you can stop this medicine and take Cipro.     Okay to hold Plavix 5 days before dental surgery and resume the day after.

## 2020-07-05 NOTE — Progress Notes (Signed)
CARDIOLOGY CLINIC     Referring provider: Curt Bears    CC: MVR     History of Presenting Illness:   Cathy Lucas is a 70 year old female with history of MVR who is here for follow up. Denies any SOB/DOE. Denies any symptoms. A1c is elevated - patient with DM. She is hesitant to take any medications. Accompanied by son today.     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     Diagnostics (I reviewed all the test results below today):  EKG 03/22/20 (interpreted by me):  Normal sinus rhythm     Echo 2/22:   1. The left ventricular size is normal. The left ventricular systolic function is normal.   2. Indeterminate left ventricular diastolic function.   3. The right ventricular size is normal and systolic function is reduced.   4. Normal pulmonary artery pressure with right ventricular systolic pressure measuring 24 mmHg based on estimated RA pressure of 3 mmHg.   5. 26 mm Sapien 3 valve in valve prosthesis is visualized. MV prosthesis juts into the LVOT, no elevated gradients across LVOT. Mean gradient of 8 mmhg at HR of 68 bpm consistent with moderate mitral stenosis.   6. Compared to prior study no significant change.    Event Monitor 3/22:   No significant pauses.   No atrial fibrillation.   Rare premature atrial complexes, <1 % burden.   Rare premature ventricular complexes, <1 % burden     Echo 2019:   The left ventricle is normal in size. The left ventricular ejection fraction is 55-60% by   visual estimation.   Estimated pulmonary arterial pressure is normal.   The right ventricle is normal size. The right ventricular function is grossly normal.   There is a prosthetic mitral valve. SAM noted due to TAVR valve in mitral position. There   is trace  mitral regurgitation.   There is mild tricuspid regurgitation.      ECHO 2018    1. The left ventricular size is normal and the left ventricular systolic function is normal.   2. The right ventricular size is mildly enlarged and systolic function is reduced.   3. S/p 26 mm Sapien 3 valve in the mitral position with mean gradient of 7 mm Hg.   4. Cannot estimate RVSP due to trace TR jet.   5. Compared to prior study 12/27/16 no change in mitral valve graidnet.     CTA Coronary 11/2016  1. No obstructive coronary artery disease.  2. 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  3. Bioprosthetic mitral valve abnormal valve leaflet motion. Internal mitral annulus diameter is 22 x 22 mm.  4. Please see report for concurrently performed CT of the chest for discussion of extracardiac findings in the thorax.      Past Medical / Surgical History:  Past Medical History:   Diagnosis Date    Back pain     Needs L4 fusion    Depression     Diabetes (CMS-HCC)     Fibromyalgia  GERD (gastroesophageal reflux disease)     Glaucoma     Hypercholesterolemia     Hypertension     Hypothyroidism     Migraines     PAF (paroxysmal atrial fibrillation) (CMS-HCC)        Social History:  Social History     Socioeconomic History    Marital status: Married     Spouse name: Not on file    Number of children: Not on file    Years of education: Not on file    Highest education level: Not on file   Occupational History    Not on file   Tobacco Use    Smoking status: Never Smoker    Smokeless tobacco: Never Used   Substance and Sexual Activity    Alcohol use: No    Drug use: No    Sexual activity: Not on file   Other Topics Concern    Caffeine Concern Not Asked   Social History Narrative    Not on file     Social Determinants of Health     Financial Resource Strain: Not on file   Food Insecurity: Not on file   Transportation Needs: Not on file   Physical Activity:  Not on file   Stress: Not on file   Social Connections: Not on file   Intimate Partner Violence: Not on file   Housing Stability: Not on file       Family History:  Family History   Problem Relation Name Age of Onset    Stroke Mother      Heart Disease Mother      Stroke Father      Heart Disease Father         Medications (medication list reviewed by me with the patient):  Current Outpatient Medications   Medication Sig    aspirin 81 MG tablet Take 81 mg by mouth daily.    atorvastatin (LIPITOR) 10 MG tablet Take 20 mg by mouth daily.     Buprenorphine HCl 150 MCG FILM Suck 150 mcg in mouth 2 times daily.    carvedilol (COREG) 25 MG tablet Take 1 tablet (25 mg) by mouth 2 times daily (with meals).    ciprofloxacin (CIPRO) 500 MG tablet Take 1 tablet (500 mg) by mouth 2 times daily.    clopidogrel (PLAVIX) 75 MG tablet Take 75 mg by mouth daily.    cyclobenzaprine (FLEXERIL) 10 MG tablet Take 10 mg by mouth as needed.     diphenhydrAMINE (BENADRYL) 25 MG capsule Take 2 capsules (50 mg) by mouth every 6 hours as needed for Itching.    empagliflozin (JARDIANCE) 10 mg tablet Take 1 tablet (10 mg) by mouth daily.    glucose blood (TRUE METRIX BLOOD GLUCOSE TEST) test strip     HYDROmorphone (DILAUDID) 4 MG tablet Take 4 mg by mouth 4 times daily as needed.    hydroxychloroquine (PLAQUENIL) 200 MG tablet Take 200 mg by mouth daily.     Lancets (STERILANCE TL) MISC     latanoprost (XALATAN) 0.005 % ophthalmic solution Place 1 drop into both eyes every evening.    levothyroxine (SYNTHROID) 88 MCG tablet Take 88 mcg by mouth every morning (before breakfast).    lidocaine (LIDODERM) 5 % patch     losartan (COZAAR) 25 MG tablet Take 0.5 tablets (12.5 mg) by mouth daily.    Melatonin 5 MG TBDP     Misc Natural Products (AIRBORNE ELDERBERRY PO)     MOVANTIK  25 MG TABS tablet Take 25 mg by mouth daily.    nitroGLYcerin (NITROSTAT) 0.4 MG SL tablet 0.4 mg by Sublingual route every 5 minutes as needed for  Chest Pain. up to 3 tabs per episode.    ondansetron (ZOFRAN) 8 MG tablet Take 1 tablet (8 mg) by mouth every 8 hours as needed for Nausea/Vomiting.    pantoprazole (PROTONIX) 40 MG tablet Take 40 mg by mouth daily.    timolol hemihydrate (BETIMOL) 0.5 % ophthalmic solution Place 1 drop into both eyes 2 times daily. use in affected eye(s)    vitamin D3 (VITAMIN D) 50 MCG (2000 UT) tablet Take 5,000 Units by mouth daily.     No current facility-administered medications for this visit.       Allergies:  Patient is allergic to ceftriaxone, iodine, iv contrast [contrast media], latex, metformin, ativan [lorazepam], definity [perflutren lipid microsphere], diclofenac, ibuprofen, januvia [sitagliptin], macrobid [nitrofurantoin], and tylenol [acetaminophen].    Physical Exam:  BP 138/78 (BP Location: Left arm, BP Patient Position: Sitting, BP cuff size: Regular)    Pulse 80    Temp 97.3 F (36.3 C) (Temporal)    Resp 16    Ht 5' 5"  (1.651 m)    Wt 80.8 kg (178 lb 3.2 oz)    LMP  (LMP Unknown)    SpO2 98%    BMI 29.65 kg/m  /    General: awake, alert, no acute distress  HEENT: NCAT, PERRL, EOMI  Neck: JVP ~7cm, no carotid bruits  Lungs: clear to auscultation bilaterally, no wheezing/crackles  Heart: RRR, nl S1S2, no murmurs, rubs or gallops,healed sternotomy   Abdomen: +BS, soft, non-tender, non-distended  Extremities: warm, well-perfused, no edema,  2+ symmetric distal pulses  Skin: dry, no rashes  Neuro: alert and oriented x3, no focal neurologic deficit    Lab Data (reviewed by me today):  Lab Results   Component Value Date    BUN 17 07/02/2020    CREAT 0.74 07/02/2020    CL 103 07/02/2020    NA 138 07/02/2020    K 4.2 07/02/2020    CA 10.0 07/02/2020    TBILI 0.30 07/02/2020    ALB 4.4 07/02/2020    TP 7.1 07/02/2020    AST 12 07/02/2020    ALK 92 07/02/2020    BICARB 24 07/02/2020    ALT 9 07/02/2020    GLU 137 (H) 07/02/2020     Lab Results   Component Value Date    WBC 7.3 07/02/2020    RBC 4.64 07/02/2020     HGB 13.4 07/02/2020    HCT 41.1 07/02/2020    MCV 88.6 07/02/2020    MCHC 32.6 07/02/2020    RDW 14.6 (H) 07/02/2020    PLT 218 07/02/2020    MPV 10.5 07/02/2020     Lab Results   Component Value Date    CHOL 136 07/02/2020    HDL 50 07/02/2020    LDLCALC 61 07/02/2020    TRIG 125 07/02/2020     Lab Results   Component Value Date    TSH 8.82 (H) 07/02/2020     Assessment/Plan:  70 year old female with endocarditis s/p MVR:    1. Endocarditis/MVR:   - s/p bovine bioprosthetic MVR in 2012 s/p Sapien 3 mitral valve placement 2018   - She is taking ASA/Plavix and is no longer on warfarin (unclear benefit).   - Will continue DAPT, she will need antibiotics before any dental surgery.   -  Moderate MS mean gradient of 8 mmHg. Will continue to monitor. BB for HR control/reduce gradients.     2. PAF:   - LAA Closure + MAZE (unclear benefit of anticoagulation).   - Currently in sinus rhythm.   - Continue Coreg 25 mg BID.      3. HTN: uncontrolled.   - Continue losartan 12.5 mg daily + Coreg 25 mg BID.,   - Start Jardiance 10 mg daily (for T2D)     4. Dyslipidemia:   - Goal LDL <70, continue Atorvastatin 20 mg daily.     5. Hilar lymphadenopathy:   - Unclear etiology also with possible history of PAH. Will assess with CT Chest. Patient encouraged to schedule.     Patient Instructions   Start Jardiance 10 mg daily, if you are sick and not eating, then make sure to hold this medication.   If you get an UTI (not common) you can stop this medicine and take Cipro.     Okay to hold Plavix 5 days before dental surgery and resume the day after.     Return in about 6 months (around 01/05/2021).    My above recommendations will be communicated with the referring physician by way of letter or the electronic medical record.    Please don't hesitate to page me should you have any questions regarding the care of this patient.    Curt Bears MD MPH  Assistant Professor of [place] of Cardiovascular  Medicine  [phone] [facility] office)  [phone] ([place] office)  [phone] (fax)  Koren Plyler-Alyzae Hawkey_0 .Bennie Pierini

## 2020-07-08 ENCOUNTER — Encounter (HOSPITAL_BASED_OUTPATIENT_CLINIC_OR_DEPARTMENT_OTHER): Payer: Self-pay | Admitting: Cardiovascular Disease

## 2020-08-30 ENCOUNTER — Telehealth (INDEPENDENT_AMBULATORY_CARE_PROVIDER_SITE_OTHER): Payer: Self-pay | Admitting: Cardiovascular Disease

## 2020-08-30 NOTE — Telephone Encounter (Signed)
Patient called, states that she has been getting UTI's off and on x 1 mo. She was instructed by Dr. Crisoforo Oxford to stop taking Jardiance and start Cipro if that were to happen but pt decided to continue taking in and wondering if instead she can decrease dosage only since Jardiance works well for her. Please Advise.    [phone]

## 2020-08-30 NOTE — Telephone Encounter (Signed)
Routing to Dr. Crisoforo Oxford to advise

## 2020-09-01 ENCOUNTER — Telehealth (INDEPENDENT_AMBULATORY_CARE_PROVIDER_SITE_OTHER): Payer: Self-pay | Admitting: Cardiovascular Disease

## 2020-09-01 NOTE — Telephone Encounter (Signed)
Patient requesting a return call to discuss possible side effect from   empagliflozin (JARDIANCE) 10 mg tablet.  Says since beginning the medication about 4 months ago she has had two UTIs.  Asking if there is a lower dose she could try. Says she has been drinking "plenty of water" as instructed by Dr. Crisoforo Oxford.  States she has [facility] home [facility] & was advised at a home visit that Vania Rea can cause UTI.  Ok to leave a detailed message on her mobile # [phone].

## 2020-09-01 NOTE — Telephone Encounter (Signed)
Routing to Dr Crisoforo Oxford to advise on Jardiance and UTI side effects

## 2020-09-02 NOTE — Telephone Encounter (Signed)
Can decrease to 1/2 tablet daily and see if any improvement.   Please have her discuss her frequent UTI's with PCP, maybe there is a medication she can be put on to prevent them?     Curt Bears MD MPH  Assistant Professor of [place] of Cardiovascular Medicine  [phone] [facility] office)  [phone] ([place] office)  [phone] (fax)  Ariyanah [email or web address]

## 2020-09-02 NOTE — Telephone Encounter (Signed)
Message relayed to patient.  Patient expressed understanding

## 2021-02-04 ENCOUNTER — Encounter (INDEPENDENT_AMBULATORY_CARE_PROVIDER_SITE_OTHER): Payer: Medicare Other | Admitting: Cardiovascular Disease

## 2021-02-16 ENCOUNTER — Other Ambulatory Visit (INDEPENDENT_AMBULATORY_CARE_PROVIDER_SITE_OTHER): Payer: Self-pay | Admitting: Cardiovascular Disease

## 2021-02-16 ENCOUNTER — Telehealth (INDEPENDENT_AMBULATORY_CARE_PROVIDER_SITE_OTHER): Payer: Self-pay | Admitting: Family Medicine

## 2021-02-16 DIAGNOSIS — I10 Essential (primary) hypertension: Secondary | ICD-10-CM

## 2021-02-22 NOTE — Telephone Encounter (Signed)
Received RX refill request from: erequest  Medication(s): losartan (COZAAR) 25 MG tablet   Last Cardio visit: 07/05/20  Next Cardio appt:03/29/21 Dr Margart Sickles  Pharmacy : [facility] [place] Clinic notes/Medication list reviewed: Y   3. HTN: uncontrolled.   - Continue losartan 12.5 mg daily     Labs and BP checked   Blood Pressure   07/05/20 138/78   03/22/20 122/76   09/03/19 (!) 160/100       Comment:(pend a new order if no labs >6 mos -10yr:       Medication/s pended and Routed to:   Dr SCrisoforo Oxford

## 2021-02-23 MED ORDER — LOSARTAN POTASSIUM 25 MG OR TABS
ORAL_TABLET | ORAL | 3 refills | Status: DC
Start: 2021-02-23 — End: 2021-03-29

## 2021-03-10 ENCOUNTER — Other Ambulatory Visit (INDEPENDENT_AMBULATORY_CARE_PROVIDER_SITE_OTHER): Payer: Self-pay | Admitting: Cardiovascular Disease

## 2021-03-10 DIAGNOSIS — I509 Heart failure, unspecified: Secondary | ICD-10-CM

## 2021-03-12 NOTE — Telephone Encounter (Signed)
Received RX refill request from: [place] # [id number]  Medication(s): carvedilol (COREG) 25 MG tablet  Last Cardio visit: 07/05/20  Next Cardio appt: 03/29/21  Pharmacy : [facility] # [place] Clinic notes/Medication list reviewed: Y   (note any changes with meds if applicable)    Labs and BP checked ( if applicable)  BMP (.llbasic) CMP (.llcomp) LIPID ( .llchol) LIVER ( .llliver)  BP (.lastbp)    Comment:(pend a new order if no labs >6 mos -63yr:       Medication/s pended and Routed to: Dr. SCrisoforo Oxford   Provider for med approval-  Front desk to schedule a follow up appt ( if none scheduled)

## 2021-03-13 MED ORDER — CARVEDILOL 25 MG OR TABS
ORAL_TABLET | ORAL | 0 refills | Status: DC
Start: 2021-03-13 — End: 2021-03-29

## 2021-03-21 ENCOUNTER — Ambulatory Visit (HOSPITAL_BASED_OUTPATIENT_CLINIC_OR_DEPARTMENT_OTHER): Payer: Medicare Other | Admitting: Cardiovascular Disease

## 2021-03-29 ENCOUNTER — Encounter (INDEPENDENT_AMBULATORY_CARE_PROVIDER_SITE_OTHER): Payer: Self-pay | Admitting: Internal Medicine

## 2021-03-29 ENCOUNTER — Ambulatory Visit (INDEPENDENT_AMBULATORY_CARE_PROVIDER_SITE_OTHER): Payer: Medicare Other | Admitting: Internal Medicine

## 2021-03-29 VITALS — BP 127/54 | HR 76 | Temp 97.4°F | Resp 16 | Ht 65.0 in | Wt 179.2 lb

## 2021-03-29 DIAGNOSIS — I272 Pulmonary hypertension, unspecified: Secondary | ICD-10-CM

## 2021-03-29 DIAGNOSIS — Z952 Presence of prosthetic heart valve: Secondary | ICD-10-CM

## 2021-03-29 DIAGNOSIS — I10 Essential (primary) hypertension: Secondary | ICD-10-CM

## 2021-03-29 DIAGNOSIS — I509 Heart failure, unspecified: Secondary | ICD-10-CM

## 2021-03-29 DIAGNOSIS — E785 Hyperlipidemia, unspecified: Secondary | ICD-10-CM

## 2021-03-29 DIAGNOSIS — E119 Type 2 diabetes mellitus without complications: Secondary | ICD-10-CM

## 2021-03-29 MED ORDER — ATORVASTATIN CALCIUM 20 MG OR TABS
20.0000 mg | ORAL_TABLET | Freq: Every day | ORAL | 3 refills | Status: DC
Start: 2021-03-29 — End: 2021-12-27

## 2021-03-29 MED ORDER — CLOPIDOGREL BISULFATE 75 MG OR TABS
75.0000 mg | ORAL_TABLET | Freq: Every day | ORAL | 3 refills | Status: DC
Start: 2021-03-29 — End: 2021-08-22

## 2021-03-29 MED ORDER — ASPIRIN 81 MG OR TBEC
81.0000 mg | DELAYED_RELEASE_TABLET | Freq: Every day | ORAL | 3 refills | Status: DC
Start: 2021-03-29 — End: 2021-12-27

## 2021-03-29 MED ORDER — LOSARTAN POTASSIUM 25 MG OR TABS
25.0000 mg | ORAL_TABLET | Freq: Every day | ORAL | 3 refills | Status: DC
Start: 2021-03-29 — End: 2021-03-29

## 2021-03-29 MED ORDER — CARVEDILOL 25 MG OR TABS
25.0000 mg | ORAL_TABLET | Freq: Two times a day (BID) | ORAL | 3 refills | Status: DC
Start: 2021-03-29 — End: 2021-12-27

## 2021-03-29 MED ORDER — LOSARTAN POTASSIUM 25 MG OR TABS
12.5000 mg | ORAL_TABLET | Freq: Every day | ORAL | 3 refills | Status: DC
Start: 2021-03-29 — End: 2021-12-27

## 2021-03-29 NOTE — Progress Notes (Signed)
CARDIOLOGY CLINIC     Referring provider: Curt Lucas    CC: MVR     History of Presenting Illness:   Cathy Lucas is a 71 year old female with history of MVR s/p bovine bioprosthetic MVR in 2012 s/p Sapien 3 mitral valve placement 2018 with 8 mmHg residual gradient, pAfib s/p MAZE and LAA surgical closure off AC, HTN, HLD, DM2, and RA who presents for follow up.     LCV with Dr. Crisoforo Lucas.   Cathy Lucas presents with her son, Cathy Lucas, who provides supplemental history. She is off jardiance due to multiple UTIs. BP at home is well controlled. Blood sugars are also better controlled. She has no CP or SOB. Has rare palpitations. Occurs for seconds every few days. No dizziness.         Past Medical History:   Bacterial Endocarditis s/p bovine bioprosthetic MVR in 2012 s/p Sapien 3 mitral valve placement 2018   LAA Closure + MAZE  Severe PH --> normalized RVSP?  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 6.4%  Rheumatoid Arthritis   NASH/Liver cyst   Hilar lymphadenopathy   Mitral Stenosis mean gradient of 8 mmHg.       Past Medical / Surgical History:  Past Medical History:   Diagnosis Date   . Back pain     Needs L4 fusion   . Depression    . Diabetes (CMS-HCC)    . Fibromyalgia    . GERD (gastroesophageal reflux disease)    . Glaucoma    . Hypercholesterolemia    . Hypertension    . Hypothyroidism    . Migraines    . PAF (paroxysmal atrial fibrillation) (CMS-HCC)        Social History:  Social History     Socioeconomic History   . Marital status: Married     Spouse name: Not on file   . Number of children: Not on file   . Years of education: Not on file   . Highest education level: Not on file   Occupational History   . Not on file   Tobacco Use   . Smoking status: Never   . Smokeless tobacco: Never   Substance and Sexual Activity   . Alcohol use: No   . Drug use: No   . Sexual activity: Not on file   Other Topics Concern   . Caffeine Concern Not Asked   Social History Narrative   . Not on file     Social Determinants of  Health     Financial Resource Strain: Not on file   Food Insecurity: Not on file   Transportation Needs: Not on file   Physical Activity: Not on file   Stress: Not on file   Social Connections: Not on file   Intimate Partner Violence: Not on file   Housing Stability: Not on file       Family History:  Family History   Problem Relation Name Age of Onset   . Stroke Mother     . Heart Disease Mother     . Stroke Father     . Heart Disease Father         Medications (medication list reviewed by me with the patient):  Current Outpatient Medications   Medication Sig   . aspirin 81 MG EC tablet Take 1 tablet (81 mg) by mouth daily.   Marland Kitchen atorvastatin (LIPITOR) 20 MG tablet Take 1 tablet (20 mg) by  mouth daily.   . Buprenorphine HCl 150 MCG FILM Suck 150 mcg in mouth 2 times daily.   . carvedilol (COREG) 25 MG tablet Take 1 tablet (25 mg) by mouth 2 times daily (with meals).   . ciprofloxacin (CIPRO) 500 MG tablet Take 1 tablet (500 mg) by mouth 2 times daily.   . clopidogrel (PLAVIX) 75 MG tablet Take 1 tablet (75 mg) by mouth daily.   . cyclobenzaprine (FLEXERIL) 10 MG tablet Take 1 tablet (10 mg) by mouth as needed.   . diphenhydrAMINE (BENADRYL) 25 MG capsule Take 2 capsules (50 mg) by mouth every 6 hours as needed for Itching.   Marland Kitchen glucose blood (TRUE METRIX BLOOD GLUCOSE TEST) test strip    . HYDROmorphone (DILAUDID) 4 MG tablet Take 1 tablet (4 mg) by mouth 4 times daily as needed.   . hydroxychloroquine (PLAQUENIL) 200 MG tablet Take 1 tablet (200 mg) by mouth daily.   . Lancets (STERILANCE TL) MISC    . latanoprost (XALATAN) 0.005 % ophthalmic solution Place 1 drop into both eyes every evening.   Marland Kitchen levothyroxine (SYNTHROID) 88 MCG tablet Take 1 tablet (88 mcg) by mouth every morning (before breakfast).   Marland Kitchen lidocaine (LIDODERM) 5 % patch    . losartan (COZAAR) 25 MG tablet Take 0.5 tablets (12.5 mg) by mouth daily.   . Melatonin 5 MG TBDP    . Misc Natural Products (AIRBORNE ELDERBERRY PO)    . MOVANTIK 25 MG TABS  tablet Take 25 mg by mouth daily.   . nitroGLYcerin (NITROSTAT) 0.4 MG SL tablet 1 tablet (0.4 mg) by Sublingual route every 5 minutes as needed for Chest Pain. up to 3 tabs per episode.   . ondansetron (ZOFRAN) 8 MG tablet Take 1 tablet (8 mg) by mouth every 8 hours as needed for Nausea/Vomiting.   . pantoprazole (PROTONIX) 40 MG tablet Take 1 tablet (40 mg) by mouth daily.   . timolol hemihydrate (BETIMOL) 0.5 % ophthalmic solution Place 1 drop into both eyes 2 times daily. use in affected eye(s)   . vitamin D3 (VITAMIN D) 50 MCG (2000 UT) tablet Take 2.5 tablets (5,000 Units) by mouth daily.     No current facility-administered medications for this visit.       Allergies:  Patient is allergic to ceftriaxone, iodine, iv contrast [contrast media], latex, metformin, ativan [lorazepam], definity [perflutren lipid microsphere], diclofenac, ibuprofen, januvia [sitagliptin], jardiance [empagliflozin], macrobid [nitrofurantoin], and tylenol [acetaminophen].    Physical Exam:  BP (!) 127/54 (BP Location: Left arm, BP Patient Position: Sitting, BP cuff size: Regular)   Pulse 76   Temp 97.4 F (36.3 C) (Temporal)   Resp 16   Ht 5' 5"  (1.651 m)   Wt 81.3 kg (179 lb 3.2 oz)   LMP  (LMP Unknown)   SpO2 97%   BMI 29.82 kg/m  /    General: awake, alert, no acute distress  HEENT: NCAT, EOMI  Neck: JVP ~7cm, no carotid bruits  Lungs: clear to auscultation bilaterally, no wheezing/crackles  Heart: RRR, nl S1S2, no murmurs, rubs or gallops,healed sternotomy   Abdomen: +BS, soft, non-tender, non-distended  Extremities: warm, well-perfused, no edema,  2+ symmetric radial pulses  Skin: dry, no rashes  Neuro: alert and oriented x3, no focal neurologic deficit    Lab Data (reviewed by me today):  Lab Results   Component Value Date    BUN 17 07/02/2020    CREAT 0.74 07/02/2020    CL 103 07/02/2020  NA 138 07/02/2020    K 4.2 07/02/2020    Damon 10.0 07/02/2020    TBILI 0.30 07/02/2020    ALB 4.4 07/02/2020    TP 7.1 07/02/2020     AST 12 07/02/2020    ALK 92 07/02/2020    BICARB 24 07/02/2020    ALT 9 07/02/2020    GLU 137 (H) 07/02/2020     Lab Results   Component Value Date    WBC 7.3 07/02/2020    RBC 4.64 07/02/2020    HGB 13.4 07/02/2020    HCT 41.1 07/02/2020    MCV 88.6 07/02/2020    MCHC 32.6 07/02/2020    RDW 14.6 (H) 07/02/2020    PLT 218 07/02/2020    MPV 10.5 07/02/2020     Lab Results   Component Value Date    CHOL 136 07/02/2020    HDL 50 07/02/2020    LDLCALC 61 07/02/2020    TRIG 125 07/02/2020     Lab Results   Component Value Date    TSH 8.82 (H) 07/02/2020     Diagnostics (I reviewed all the test results below today):  EKG 03/22/20:  Normal sinus rhythm     Echo 04/16/20:   1. The left ventricular size is normal. The left ventricular systolic function is normal.   2. Indeterminate left ventricular diastolic function.   3. The right ventricular size is normal and systolic function is reduced.   4. Normal pulmonary artery pressure with right ventricular systolic pressure measuring 24 mmHg based on estimated RA pressure of 3 mmHg.   5. 26 mm Sapien 3 valve in valve prosthesis is visualized. MV prosthesis juts into the LVOT, no elevated gradients across LVOT. Mean gradient of 8 mmhg at HR of 68 bpm consistent with moderate mitral stenosis.   6. Compared to prior study no significant change.    Event Monitor 05/18/20:   No significant pauses.   No atrial fibrillation.   Rare premature atrial complexes, <1 % burden.   Rare premature ventricular complexes, <1 % burden     Echo 2019:   The left ventricle is normal in size. The left ventricular ejection fraction is 55-60% by   visual estimation.   Estimated pulmonary arterial pressure is normal.   The right ventricle is normal size. The right ventricular function is grossly normal.   There is a prosthetic mitral valve. SAM noted due to TAVR valve in mitral position. There   is trace mitral regurgitation.   There is mild tricuspid regurgitation.      ECHO 2018    1. The left  ventricular size is normal and the left ventricular systolic function is normal.   2. The right ventricular size is mildly enlarged and systolic function is reduced.   3. S/p 26 mm Sapien 3 valve in the mitral position with mean gradient of 7 mm Hg.   4. Cannot estimate RVSP due to trace TR jet.   5. Compared to prior study 12/27/16 no change in mitral valve graidnet.     CTA Coronary 11/2016  1. No obstructive coronary artery disease.  2. 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  3. Bioprosthetic mitral valve abnormal valve leaflet motion. Internal mitral annulus diameter is 22 x 22 mm.  4. Please see report for concurrently performed CT of the chest for discussion of extracardiac findings in the thorax.  Assessment/Plan:  71 year old female with endocarditis s/p MVR who presents for follow up.     # Endocarditis/MVR:    s/p bovine bioprosthetic MVR in 2012 s/p Sapien 3 in mitral position 2018. Finished 6 mo of warfarin. Now on DAPT per interventional team due to mitral position of the valve  - continue aspirin 81 mg qday and plavix 75 mg qday  - will need antibiotics before any dental work   - Moderate MS mean gradient of 8 mmHg. Continue coreg 25 mg BID  - echo annually, ordered now    # PAF:   No known recurrence recently. S/p LAA Closure + MAZE. Pt is off anticoagulation. We discussed that this strategy is not studied. Pt is OK to stay on DAPT only  - Continue Coreg 25 mg BID  - DAPT as above  - advised for any CVA/TIA symptoms to go to ED for evaluation     # HTN  At goal.  - Continue losartan 12.5 mg daily + Coreg 25 mg BID    #. Dyslipidemia:   - Goal LDL <70, continue Atorvastatin 20 mg daily.   - repeat labs now    # Pulmonary HTN  Not noted on most recent echo. Could be due to mitral valve disease  - repeat echo as above    Patient Instructions   Continue current medications    Full panel of labs at your convenience      Return in about  7 months (around 10/27/2021).    My above recommendations will be communicated with the referring physician by way of letter or the electronic medical record.    Please don't hesitate to page me should you have any questions regarding the care of this patient.      Malva Cogan, MD  Cardiology

## 2021-03-29 NOTE — Patient Instructions (Addendum)
Continue current medications    Full panel of labs at your convenience

## 2021-05-18 ENCOUNTER — Ambulatory Visit (INDEPENDENT_AMBULATORY_CARE_PROVIDER_SITE_OTHER): Payer: Medicare Other

## 2021-08-22 ENCOUNTER — Other Ambulatory Visit (INDEPENDENT_AMBULATORY_CARE_PROVIDER_SITE_OTHER): Payer: Self-pay | Admitting: Cardiovascular Disease

## 2021-08-22 DIAGNOSIS — Z952 Presence of prosthetic heart valve: Secondary | ICD-10-CM

## 2021-08-22 MED ORDER — CLOPIDOGREL BISULFATE 75 MG OR TABS
ORAL_TABLET | ORAL | 3 refills | Status: DC
Start: 2021-08-22 — End: 2022-06-27

## 2021-08-22 NOTE — Telephone Encounter (Signed)
Cardiovascular Medication Refill Request:  Blood Pressure   03/29/21 (!) 127/54   07/05/20 138/78   03/22/20 122/76     Lab Results   Component Value Date    NA 138 07/02/2020    K 4.2 07/02/2020    CREAT 0.74 07/02/2020    EGFRCKDEPI >60 07/02/2020    A1C 7.8 (H) 07/02/2020     Last Labs instead Scanned in Media File: No  Last visit in this department 03/29/2021 (Dr. Margart Sickles)  Next visit in this department 10/28/2021 (Dr. Margart Sickles)     ACTION Consideration by MD or Nurse:     IF not seen in clinic for > 1 year then consider MD office visit follow up.    IF Basic Metabolic Panel Labs dated > 54 year old, then consider re-order.    IF last BP > 139/89, then consider Nurse Visit for BP check or MD office visit.    IF Issues, then consider refill for only one month especially if not seen in > 1 year.     Refill request from: [id number] - [place], [place] - [address]  Medication requested: clopidogrel (PLAVIX) 75 MG tablet   Pharmacy updated for the refill request: Yes

## 2021-10-28 ENCOUNTER — Encounter (INDEPENDENT_AMBULATORY_CARE_PROVIDER_SITE_OTHER): Payer: Medicare Other | Admitting: Internal Medicine

## 2021-11-11 ENCOUNTER — Ambulatory Visit (INDEPENDENT_AMBULATORY_CARE_PROVIDER_SITE_OTHER): Payer: Medicare Other

## 2021-11-11 DIAGNOSIS — Z952 Presence of prosthetic heart valve: Secondary | ICD-10-CM

## 2021-11-11 DIAGNOSIS — I342 Nonrheumatic mitral (valve) stenosis: Secondary | ICD-10-CM

## 2021-11-16 ENCOUNTER — Telehealth (INDEPENDENT_AMBULATORY_CARE_PROVIDER_SITE_OTHER): Payer: Self-pay | Admitting: Internal Medicine

## 2021-11-16 NOTE — Telephone Encounter (Signed)
Patient is requesting a call back regarding echo results from 11/11/21, results are still in process.  States a call to patient or a mychart message is ok.  Her son relays that patient is anxious to receive the results.Marland Kitchen

## 2021-11-17 LAB — 2D ECHO WITH IMAGE ENHANCEMENT AGENT IF NECESSARY
IVC Diameter: 1.44 cm
LA Volume Index: 35.2 mL/m2
LV Ejection Fraction: 57 %
Mitral Mean Gradient: 8 mm[Hg]

## 2021-11-18 NOTE — Telephone Encounter (Signed)
Called and LVM with findings of echo per Dr Abel Presto is functioning same as previously, which is good.     Left clinic number if pt had additional questions or concerns.

## 2021-11-18 NOTE — Telephone Encounter (Signed)
Echo results finalized.  Routing to Dr Margart Sickles.     Summary:   1. The left ventricular size is normal. The left ventricular systolic function is normal. Left ventricular ejection fraction by Simpson's biplane is 57 %.   2. The right ventricular size is normal and systolic function is normal.   3. Pulmonary artery pressure could not be measured because there was insufficient tricuspid regurgitation.   4. S/p bioprosthetic mitral valve with a mean gradient of 8 mmHg at a heart rate of 67 bpm.   5. No sigificant gradient across LVOT.   6. Compared to prior study no significant change.

## 2021-12-27 ENCOUNTER — Ambulatory Visit (INDEPENDENT_AMBULATORY_CARE_PROVIDER_SITE_OTHER): Payer: Medicare Other | Admitting: Internal Medicine

## 2021-12-27 VITALS — BP 105/64 | HR 78 | Temp 97.1°F | Resp 16 | Ht 65.0 in | Wt 184.0 lb

## 2021-12-27 DIAGNOSIS — Z952 Presence of prosthetic heart valve: Secondary | ICD-10-CM

## 2021-12-27 DIAGNOSIS — I10 Essential (primary) hypertension: Secondary | ICD-10-CM

## 2021-12-27 DIAGNOSIS — I272 Pulmonary hypertension, unspecified: Secondary | ICD-10-CM

## 2021-12-27 DIAGNOSIS — E119 Type 2 diabetes mellitus without complications: Secondary | ICD-10-CM

## 2021-12-27 DIAGNOSIS — E785 Hyperlipidemia, unspecified: Secondary | ICD-10-CM

## 2021-12-27 DIAGNOSIS — I509 Heart failure, unspecified: Secondary | ICD-10-CM

## 2021-12-27 MED ORDER — ASPIRIN 81 MG OR TBEC
81.0000 mg | DELAYED_RELEASE_TABLET | Freq: Every day | ORAL | 3 refills | Status: DC
Start: 2021-12-27 — End: 2022-10-31

## 2021-12-27 MED ORDER — CARVEDILOL 25 MG OR TABS
25.0000 mg | ORAL_TABLET | Freq: Two times a day (BID) | ORAL | 3 refills | Status: DC
Start: 2021-12-27 — End: 2022-10-31

## 2021-12-27 MED ORDER — LOSARTAN POTASSIUM 25 MG OR TABS
12.5000 mg | ORAL_TABLET | Freq: Every day | ORAL | 3 refills | Status: DC
Start: 2021-12-27 — End: 2022-06-27

## 2021-12-27 MED ORDER — ATORVASTATIN CALCIUM 20 MG OR TABS
20.0000 mg | ORAL_TABLET | Freq: Every day | ORAL | 3 refills | Status: DC
Start: 2021-12-27 — End: 2022-10-31

## 2021-12-27 NOTE — Patient Instructions (Signed)
Get labs at [facility] current meds

## 2021-12-27 NOTE — Progress Notes (Unsigned)
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 Prev saw Dr. Crisoforo Oxford.    She presents with her son, Cathy Lucas, again. Biggest issue is poor sleep due to difficulty getting comfortable in bed. She denies orthopnea. She reports memory issues with benzos so she is working on getting sleep with melatonin and muscle relaxants.   She reports blue lips and pale skin since surgery.  She denies pedal edema. Continues to have rare palpitations, lasts a few seconds.         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 6.4%  Rheumatoid Arthritis   NASH/Liver cyst   Hilar lymphadenopathy   Mitral Stenosis mean gradient of 8 mmHg.       Past Medical / Surgical History:  Past Medical History:   Diagnosis Date    Back pain     Needs L4 fusion    Depression     Diabetes (CMS-HCC)     Fibromyalgia     GERD (gastroesophageal reflux disease)     Glaucoma     Hypercholesterolemia     Hypertension     Hypothyroidism     Migraines     PAF (paroxysmal atrial fibrillation) (CMS-HCC)        Social History:  Social History     Socioeconomic History    Marital status: Married     Spouse name: Not on file    Number of children: Not on file    Years of education: Not on file    Highest education level: Not on file   Occupational History    Not on file   Tobacco Use    Smoking status: Never    Smokeless tobacco: Never   Substance and Sexual Activity    Alcohol use: No    Drug use: No    Sexual activity: Not on file   Other Topics  Concern    Caffeine Concern Not Asked   Social History Narrative    Not on file     Social Determinants of Health     Financial Resource Strain: Not on file   Food Insecurity: Not on file   Transportation Needs: Not on file   Physical Activity: Not on file   Stress: Not on file   Social Connections: Not on file   Intimate Partner Violence: Low Risk  (12/27/2021)     IPV     IPV Risk Score: 0   Housing Stability: Not on file       Family History:  Family History   Problem Relation Name Age of Onset    Stroke Mother      Heart Disease Mother      Stroke Father      Heart Disease Father         Medications (medication list reviewed by me with the patient):  Current Outpatient Medications  Medication Sig    aspirin 81 MG EC tablet Take 1 tablet (81 mg) by mouth daily.    atorvastatin (LIPITOR) 20 MG tablet Take 1 tablet (20 mg) by mouth daily.    Buprenorphine HCl 150 MCG FILM Suck 1 Film (150 mcg) in mouth 2 times daily.    carvedilol (COREG) 25 MG tablet Take 1 tablet (25 mg) by mouth 2 times daily (with meals).    clopidogrel (PLAVIX) 75 MG tablet TAKE ONE TABLET BY MOUTH EVERY 24 HOURS FOR BLOOD THINNER    cyclobenzaprine (FLEXERIL) 10 MG tablet Take 1 tablet (10 mg) by mouth as needed.    diphenhydrAMINE (BENADRYL) 25 MG capsule Take 2 capsules (50 mg) by mouth every 6 hours as needed for Itching.    glucose blood (TRUE METRIX BLOOD GLUCOSE TEST) test strip     HYDROmorphone (DILAUDID) 4 MG tablet Take 1 tablet (4 mg) by mouth 4 times daily as needed.    hydroxychloroquine (PLAQUENIL) 200 MG tablet Take 1 tablet (200 mg) by mouth daily.    Lancets (STERILANCE TL) MISC     latanoprost (XALATAN) 0.005 % ophthalmic solution Place 1 drop into both eyes every evening.    levothyroxine (SYNTHROID) 88 MCG tablet Take 1 tablet (88 mcg) by mouth every morning (before breakfast).    lidocaine (LIDODERM) 5 % patch     losartan (COZAAR) 25 MG tablet Take 0.5 tablets (12.5 mg) by mouth daily.    Melatonin 5 MG TBDP     Misc  Natural Products (AIRBORNE ELDERBERRY PO)     MOVANTIK 25 MG TABS tablet Take 25 mg by mouth daily.    nitroGLYcerin (NITROSTAT) 0.4 MG SL tablet 1 tablet (0.4 mg) by Sublingual route every 5 minutes as needed for Chest Pain. up to 3 tabs per episode.    ondansetron (ZOFRAN) 8 MG tablet Take 1 tablet (8 mg) by mouth every 8 hours as needed for Nausea/Vomiting.    pantoprazole (PROTONIX) 40 MG tablet Take 1 tablet (40 mg) by mouth daily.    timolol hemihydrate (BETIMOL) 0.5 % ophthalmic solution Place 1 drop into both eyes 2 times daily. use in affected eye(s)    vitamin D3 (VITAMIN D) 50 MCG (2000 UT) tablet Take 2.5 tablets (5,000 Units) by mouth daily.     No current facility-administered medications for this visit.       Allergies:  Patient is allergic to ceftriaxone, iodine, iv contrast [contrast media], latex, metformin, ativan [lorazepam], definity [perflutren lipid microsphere], diclofenac, ibuprofen, januvia [sitagliptin], jardiance [empagliflozin], macrobid [nitrofurantoin], and tylenol [acetaminophen].    Physical Exam:  BP 105/64 (BP Location: Left arm, BP Patient Position: Sitting, BP cuff size: Large)   Pulse 78   Temp 97.1 F (36.2 C) (Temporal)   Resp 16   Ht _0  (1.651 m)   Wt 83.5 kg (184 lb)   LMP  (LMP Unknown)   SpO2 98%   BMI 30.62 kg/m  /    General: awake, alert, no acute distress  HEENT: NCAT, EOMI  Neck: JVP ~7cm, no carotid bruits  Lungs: clear to auscultation bilaterally, no wheezing/crackles  Heart: RRR, nl S1S2, no murmurs, rubs or gallops,healed sternotomy   Abdomen: +BS, soft, non-tender, non-distended  Extremities: warm, well-perfused, no edema,  2+ symmetric radial pulses  Skin: dry, no rashes  Neuro: alert and oriented x3, no focal neurologic deficit    Lab Data (reviewed by me today):  Lab Results   Component Value Date    BUN  17 07/02/2020    CREAT 0.74 07/02/2020    CL 103 07/02/2020    NA 138 07/02/2020    K 4.2 07/02/2020     10.0 07/02/2020    TBILI 0.30  07/02/2020    ALB 4.4 07/02/2020    TP 7.1 07/02/2020    AST 12 07/02/2020    ALK 92 07/02/2020    BICARB 24 07/02/2020    ALT 9 07/02/2020    GLU 137 (H) 07/02/2020     Lab Results   Component Value Date    WBC 7.3 07/02/2020    RBC 4.64 07/02/2020    HGB 13.4 07/02/2020    HCT 41.1 07/02/2020    MCV 88.6 07/02/2020    MCHC 32.6 07/02/2020    RDW 14.6 (H) 07/02/2020    PLT 218 07/02/2020    MPV 10.5 07/02/2020     Lab Results   Component Value Date    CHOL 136 07/02/2020    HDL 50 07/02/2020    LDLCALC 61 07/02/2020    TRIG 125 07/02/2020     Lab Results   Component Value Date    TSH 8.82 (H) 07/02/2020     Diagnostics (I reviewed all the test results below today):  EKG 03/22/20:  Normal sinus rhythm     Echo 11/11/21   1. The left ventricular size is normal. The left ventricular systolic function is normal. Left ventricular ejection fraction by Simpson's biplane is 57 %.   2. The right ventricular size is normal and systolic function is normal.   3. Pulmonary artery pressure could not be measured because there was insufficient tricuspid regurgitation.   4. S/p bioprosthetic mitral valve with a mean gradient of 8 mmHg at a heart rate of 67 bpm.   5. No sigificant gradient across LVOT.   6. Compared to prior study no significant change.    Echo 04/16/20:   1. The left ventricular size is normal. The left ventricular systolic function is normal.   2. Indeterminate left ventricular diastolic function.   3. The right ventricular size is normal and systolic function is reduced.   4. Normal pulmonary artery pressure with right ventricular systolic pressure measuring 24 mmHg based on estimated RA pressure of 3 mmHg.   5. 26 mm Sapien 3 valve in valve prosthesis is visualized. MV prosthesis juts into the LVOT, no elevated gradients across LVOT. Mean gradient of 8 mmhg at HR of 68 bpm consistent with moderate mitral stenosis.   6. Compared to prior study no significant change.    Event Monitor 05/18/20:   No significant  pauses.   No atrial fibrillation.   Rare premature atrial complexes, <1 % burden.   Rare premature ventricular complexes, <1 % burden     Echo 2019:   The left ventricle is normal in size. The left ventricular ejection fraction is 55-60% by   visual estimation.   Estimated pulmonary arterial pressure is normal.   The right ventricle is normal size. The right ventricular function is grossly normal.   There is a prosthetic mitral valve. SAM noted due to TAVR valve in mitral position. There   is trace mitral regurgitation.   There is mild tricuspid regurgitation.      ECHO 2018    1. The left ventricular size is normal and the left ventricular systolic function is normal.   2. The right ventricular size is mildly enlarged and systolic function is reduced.   3. S/p 26 mm Sapien  3 valve in the mitral position with mean gradient of 7 mm Hg.   4. Cannot estimate RVSP due to trace TR jet.   5. Compared to prior study 12/27/16 no change in mitral valve graidnet.     CTA Coronary 11/2016  1. No obstructive coronary artery disease.  2. 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  3. Bioprosthetic mitral valve abnormal valve leaflet motion. Internal mitral annulus diameter is 22 x 22 mm.  4. Please see report for concurrently performed CT of the chest for discussion of extracardiac findings in the thorax.          Assessment/Plan:  71 year old female with endocarditis s/p MVR who presents for follow up.     # Endocarditis/MVR:    s/p bovine bioprosthetic MVR in 2012 s/p Sapien 3 in mitral position 2018. Finished 6 mo of warfarin. Now on DAPT per interventional team due to mitral position of the valve  - continue aspirin 81 mg qday and plavix 75 mg qday  - will need antibiotics before any dental work   - Moderate MS mean gradient of 8 mmHg. Continue coreg 25 mg BID  - echo annually, ordered now    # PAF:   No known recurrence recently. S/p LAA Closure + MAZE. Pt is off  anticoagulation. We discussed that this strategy is not studied. Pt is OK to stay on DAPT only  - Continue Coreg 25 mg BID  - DAPT as above  - advised for any CVA/TIA symptoms to go to ED for evaluation     # HTN  At goal.  - Continue losartan 12.5 mg daily + Coreg 25 mg BID    #. Dyslipidemia:   - Goal LDL <70, continue Atorvastatin 20 mg daily.   - repeat labs now    # Pulmonary HTN  Not noted on most recent echo. Could be due to mitral valve disease  ***  - repeat echo as above    Patient Instructions   Get labs at [facility] current meds        Return in about 6 months (around 06/27/2022).    My above recommendations will be communicated with the referring physician by way of letter or the electronic medical record.    Please don't hesitate to page me should you have any questions regarding the care of this patient.      Malva Cogan, MD  Cardiology

## 2022-03-09 ENCOUNTER — Telehealth (HOSPITAL_COMMUNITY): Payer: Self-pay | Admitting: Internal Medicine

## 2022-03-09 NOTE — Telephone Encounter (Signed)
Spoke with patient son regarding lab order request to be sent to [facility]. Per office we cannot send . Will callback with [facility] fax #

## 2022-03-11 LAB — LIPID(CHOL FRACT) PANEL, BLOOD
Cholesterol: 151 mg/dL (ref 100–199)
HDL Cholesterol: 62 mg/dL (ref >39)
LDL Chol CAL (NIH) - LABCORP: 64 mg/dL (ref 0–99)
Non-HDL Cholesterol: 89 mg/dL (ref 0–129)
Triglycerides: 150 mg/dL — ABNORMAL HIGH (ref 0–149)
VLDL Cholesterol CAL: 25 mg/dL (ref 5–40)

## 2022-03-11 LAB — GLYCOSYLATED HGB(A1C), BLOOD: Hemoglobin A1c: 7.4 % — ABNORMAL HIGH (ref 4.8–5.6)

## 2022-04-05 ENCOUNTER — Telehealth (HOSPITAL_COMMUNITY): Payer: Self-pay | Admitting: Internal Medicine

## 2022-04-05 NOTE — Telephone Encounter (Signed)
Patient son stated MD Margart Sickles wanted him to have his mother lab results faxed over to [facility] Please fax too [phone]

## 2022-04-07 NOTE — Telephone Encounter (Signed)
Latest labs from 03/10/22 faxed to [facility]   Tried to call son back but unable to leave a message as unidentified Voice mail

## 2022-06-13 ENCOUNTER — Ambulatory Visit (INDEPENDENT_AMBULATORY_CARE_PROVIDER_SITE_OTHER): Payer: Self-pay | Admitting: Internal Medicine

## 2022-06-13 NOTE — Telephone Encounter (Signed)
Patient PCP, Dr. Cheree Ditto, is calling and asking if she can speak with Dr. Sharol Given or RN  regarding patient care. She is requesting a callback. Please assist thank you.

## 2022-06-13 NOTE — Telephone Encounter (Signed)
Patient's son Onalee Hua states that patient's PCP urged him to follow up on previous message. He repeats that PCP Dr Cheree Ditto is wanting to speak with Dr Sharol Given asap regarding patient's care. Also mentions that Dr Diana Eves stated to him that she needs to be seen sooner than currently scheduled on 5/7--that it is "life or death". No availability prior to 5/7.  Pt's son states ok to leave a detailed message on his mobile # [phone] --states that his phone is his business phone but is secure & he is the only one with access.

## 2022-06-13 NOTE — Telephone Encounter (Signed)
Reason for Call: Message For MD/RN and Chest Pain     Background:Patient reports intermittent, non-radiating sternal chest pressure/dull pain 5-7/10 episodes x2 weeks--states episodes are random, last ~ 5 min, resolve with rest, & occur 2-3 times daily for the last 2 weeks; denies SOB/nausea during episodes. No pain at time of call.    Patient states she has been off her Plaquenil x2-3 weeks, she just restarted today. Patient notes that she took Losartan  yesterday after noting her BP was high--patient seen by PCP Dr. Diana Eves today & advised to f/u w/Cardiology re: Chest Pain; Dr Diana Eves also called office; call back w/VM & # left @ 1708.    BP @ OV per son 147/87 HR 81--on recheck BP was lower, but unable to recall.    Per Protocol patient advised to go to ER. Patient stated understanding but refused ER eval at this time. Reviewed ER eval rationale with patient & son--patient states she will go to ER if pain recurs. Requesting sooner appt with Dr. Sharol Given, scheduled for 5/7 at this time.  Summary of last Cardiology clinic note: 12/27/21 Assessment/Plan:  72 year old female with endocarditis s/p MVR who presents for follow up.      # Endocarditis/MVR:    s/p bovine bioprosthetic MVR in 2012 s/p Sapien 3 in mitral position 2018. Finished 6 mo of warfarin. Now on DAPT per interventional team due to mitral position of the valve  - continue aspirin 81 mg qday and plavix 75 mg qday  - will need antibiotics before any dental work   - Moderate MS mean gradient of 8 mmHg, stable on 2023 echo  - Continue coreg 25 mg BID  - next echo 10/2022     # PAF:   No known recurrence recently. S/p LAA Closure + MAZE. Pt is off anticoagulation. We discussed previously that this strategy is not studied. Pt is OK to stay on DAPT only  - Continue Coreg 25 mg BID  - DAPT as above  - advised for any CVA/TIA symptoms to go to ED for evaluation      # HTN  At goal. If BP trends lower, may need to stop losartan. Continue coreg due to mitral  gradient  - Continue losartan 12.5 mg daily + Coreg 25 mg BID     #. Dyslipidemia:   - Goal LDL <70, continue Atorvastatin 20 mg daily.   - repeat labs now (ordered)        # Pulmonary HTN  Not noted on most recent echo. Likely due to MV disease previously        # DM2  Needs good control to reduce risk of cardiovascular complications. Repeat A1c ordered        Patient Instructions   Get labs at [facility]        Continue current meds  Summary of last cardiology testing done (echo, cath, EP procedure, surgery, event monitor): Echo 11/11/21 Summary:   1. The left ventricular size is normal. The left ventricular systolic function is normal. Left ventricular ejection fraction by Simpson's biplane is 57 %.   2. The right ventricular size is normal and systolic function is normal.   3. Pulmonary artery pressure could not be measured because there was insufficient tricuspid regurgitation.   4. S/p bioprosthetic mitral valve with a mean gradient of 8 mmHg at a heart rate of 67 bpm.   5. No sigificant gradient across LVOT.   6. Compared to prior study no significant change.  List of cardiac medications that the patient is taking at home:  Asa  daily  Atorvastatin  nightly  Coreg  bid  Plavix  daily  Losartan 12.5mg  daily---tok  yesterday  Nitrostat 0.4mg  q5min prn  Recent home blood pressure readings:see above  Date of last discharge from the hospital: 12/26/2016 s/p MVR  Disposition:   Go to ED now--patient refused       PROVIDER ACTION REQUESTED: YES, please review and advise.   RN ACTION: Advice given, Routing to Provider for review, and Pt advised to seek ER, pt refused    ER eval advised--patient refused at this time, stating no pain at time of call, states she will go if pain reoccurs.    Please advise.      Reason for Disposition   [1] Chest pain lasts > 5 minutes AND [2] occurred in past 3 days (72 hours) (Exception: feels exactly the same as previously diagnosed heartburn and has accompanying sour  taste in mouth)    Additional Information   Negative: SEVERE difficulty breathing (e.g., struggling for each breath, speaks in single words)   Negative: Difficult to awaken or acting confused (e.g., disoriented, slurred speech)   Negative: Shock suspected (e.g., cold/pale/clammy skin, too weak to stand, low BP, rapid pulse)   Negative: Passed out (i.e., fainted, collapsed and was not responding)   Negative: [1] Chest pain lasts > 5 minutes AND [2] age > 77   Negative: [1] Chest pain lasts > 5 minutes AND [2] age > 30 AND [3] one or more cardiac risk factors (e.g., diabetes, high blood pressure, high cholesterol, smoker, or strong family history of heart disease)   Negative: [1] Chest pain lasts > 5 minutes AND [2] history of heart disease (i.e., angina, heart attack, heart failure, bypass surgery, takes nitroglycerin)   Negative: [1] Chest pain lasts > 5 minutes AND [2] described as crushing, pressure-like, or heavy   Negative: Heart beating < 50 beats per minute OR > 140 beats per minute   Negative: Visible sweat on face or sweat dripping down face   Negative: Sounds like a life-threatening emergency to the triager   Negative: Followed a chest injury   Negative: SEVERE chest pain   Negative: [1] Chest pain (or "angina") comes and goes AND [2] is happening more often (increasing in frequency) or getting worse (increasing in severity) (Exception: chest pains that last only a few seconds)   Negative: Pain also in shoulder(s) or arm(s) or jaw (Exception: pain is clearly made worse by movement)   Negative: Difficulty breathing   Negative: Dizziness or lightheadedness   Negative: Coughing up blood   Negative: Cocaine use within last 3 days   Negative: Major surgery in past month   Negative: Hip or leg fracture (broken bone) in past month (or had cast on leg or ankle in past month)   Negative: Illness requiring prolonged bedrest in past month (e.g., immobilization, long hospital stay)   Negative: Long-distance travel in  past month (e.g., car, bus, train, plane; with trip lasting 6 or more hours)   Negative: History of prior "blood clot" in leg or lungs (i.e., deep vein thrombosis, pulmonary embolism)   Negative: History of inherited increased risk of blood clots (e.g., Factor 5 Leiden, Anti-thrombin 3, Protein C or Protein S deficiency, Prothrombin mutation)   Negative: Cancer treatment in past six months (or has cancer now)    Protocols used: Chest Pain-A-AH

## 2022-06-14 NOTE — Telephone Encounter (Signed)
Called and spoke with pt and pt son on speaker.  Pt states she is feeling much better after restarting her Plaquenil   .    Offered earlier apt for 4/26 at 8:20 AM   Pt declined as said she cannot do early mornings.

## 2022-06-15 NOTE — Telephone Encounter (Signed)
PCP called in to f/up; she's requesting call back to touch base and to be provided an update.    [phone]

## 2022-06-15 NOTE — Telephone Encounter (Signed)
Secure chat sent to MD

## 2022-06-16 NOTE — Telephone Encounter (Signed)
Called and left another VM with scheduling number to call clinic back

## 2022-06-16 NOTE — Telephone Encounter (Signed)
Pt states she feeling better and wants to wait and keep her 06/27/22 apt

## 2022-06-16 NOTE — Telephone Encounter (Signed)
Called and reached out pt son. He is going to confirm 06/19/22 3pm apt with his mother and call us back by end of day.    Please schedule if calls back and pt able to come.

## 2022-06-16 NOTE — Telephone Encounter (Signed)
Called and LVM for pt to offer her earlier apt.  Please if pt calls back offer her 4/29 at 3pm (On hold) with Dr Sharol Given  Will attempt to cal pt back later today

## 2022-06-27 ENCOUNTER — Other Ambulatory Visit (INDEPENDENT_AMBULATORY_CARE_PROVIDER_SITE_OTHER): Payer: Self-pay | Admitting: Internal Medicine

## 2022-06-27 ENCOUNTER — Ambulatory Visit (INDEPENDENT_AMBULATORY_CARE_PROVIDER_SITE_OTHER): Payer: Medicare Other | Admitting: Internal Medicine

## 2022-06-27 VITALS — BP 116/73 | HR 69 | Temp 98.1°F | Resp 16 | Ht 65.0 in | Wt 184.1 lb

## 2022-06-27 DIAGNOSIS — I480 Paroxysmal atrial fibrillation: Secondary | ICD-10-CM

## 2022-06-27 DIAGNOSIS — R0789 Other chest pain: Secondary | ICD-10-CM

## 2022-06-27 DIAGNOSIS — I272 Pulmonary hypertension, unspecified: Secondary | ICD-10-CM

## 2022-06-27 DIAGNOSIS — I10 Essential (primary) hypertension: Secondary | ICD-10-CM

## 2022-06-27 DIAGNOSIS — E119 Type 2 diabetes mellitus without complications: Secondary | ICD-10-CM

## 2022-06-27 DIAGNOSIS — I050 Rheumatic mitral stenosis: Secondary | ICD-10-CM

## 2022-06-27 DIAGNOSIS — Z952 Presence of prosthetic heart valve: Secondary | ICD-10-CM

## 2022-06-27 MED ORDER — CLOPIDOGREL BISULFATE 75 MG OR TABS
75.0000 mg | ORAL_TABLET | Freq: Every day | ORAL | 3 refills | Status: AC
Start: 2022-06-27 — End: ?

## 2022-06-27 MED ORDER — AMOXICILLIN 500 MG OR TABS
2000.0000 mg | ORAL_TABLET | Freq: Once | ORAL | 3 refills | Status: AC | PRN
Start: 2022-06-27 — End: ?

## 2022-06-27 MED ORDER — LOSARTAN POTASSIUM 25 MG OR TABS
25.0000 mg | ORAL_TABLET | Freq: Every day | ORAL | 3 refills | Status: DC
Start: 2022-06-27 — End: 2023-04-17

## 2022-06-27 MED ORDER — NITROGLYCERIN 0.4 MG SL SUBL
0.4000 mg | SUBLINGUAL_TABLET | SUBLINGUAL | 5 refills | Status: DC | PRN
Start: 2022-06-27 — End: 2022-06-28

## 2022-06-27 NOTE — Patient Instructions (Signed)
Schedule echocardiogram    Follow up after

## 2022-06-27 NOTE — Progress Notes (Signed)
CARDIOLOGY CLINIC     Referring provider: Marni Griffon    CC: MVR     History of Presenting Illness:   Cathy Lucas is a 72 year old female with history of MVR s/p bovine bioprosthetic MVR in 2012 s/p Sapien 3 mitral valve placement 2018 with 8 mmHg residual gradient, pAfib s/p MAZE and LAA surgical closure off AC, HTN, HLD, DM2, and RA who presents for follow up. Prev saw Dr. Flonnie Hailstone.      Interval hx:  Onalee Hua (son) reports running out of pain medication and plaquenil caused her to have uncontrolled pain. Since restarting her medications, she is doing much better.   Continues to have some chest pain - feels like sensation of inability to catch breath. Has gained weight. Was off thyroid medication for months and thyroid levels are normalizing.   Feels mild dizziness - mainly weakness but no presyncope.           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       Past Medical / Surgical History:  Past Medical History:   Diagnosis Date    Back pain     Needs L4 fusion    Depression     Diabetes (CMS-HCC)     Fibromyalgia     GERD (gastroesophageal reflux disease)     Glaucoma     Hypercholesterolemia     Hypertension     Hypothyroidism     Migraines     PAF (paroxysmal atrial fibrillation) (CMS-HCC)        Social History:  Social History     Socioeconomic History    Marital status: Married     Spouse name: Not on file    Number of children: Not on file    Years of education: Not on file    Highest education level: Not on file   Occupational History    Not on file   Tobacco Use    Smoking status: Never    Smokeless tobacco: Never   Substance and Sexual Activity    Alcohol use: No    Drug use: No    Sexual activity: Not on  file   Other Topics Concern    Caffeine Concern Not Asked   Social History Narrative    Not on file     Social Determinants of Health     Financial Resource Strain: Not on file   Food Insecurity: Not on file   Transportation Needs: Not on file   Physical Activity: Not on file   Stress: Not on file   Social Connections: Not on file   Intimate Partner Violence: Not on file   Housing Stability: Not on file       Family History:  Family History   Problem Relation Name Age of Onset    Stroke Mother      Heart Disease Mother      Stroke Father      Heart Disease Father         Medications (medication list reviewed by me with the patient):  Current Outpatient Medications   Medication Sig    amoxicillin (AMOXIL)  500 MG tablet Take 4 tablets (2,000 mg) by mouth once as needed (take 30-60 minutes before dental work) for up to 1 dose.    aspirin 81 MG EC tablet Take 1 tablet (81 mg) by mouth daily.    atorvastatin (LIPITOR) 20 MG tablet Take 1 tablet (20 mg) by mouth daily.    Buprenorphine HCl 150 MCG FILM Suck 1 Film (150 mcg) in mouth 2 times daily.    carvedilol (COREG) 25 MG tablet Take 1 tablet (25 mg) by mouth 2 times daily (with meals).    clopidogrel (PLAVIX) 75 MG tablet Take 1 tablet (75 mg) by mouth daily.    cyclobenzaprine (FLEXERIL) 10 MG tablet Take 1 tablet (10 mg) by mouth as needed.    diphenhydrAMINE (BENADRYL) 25 MG capsule Take 2 capsules (50 mg) by mouth every 6 hours as needed for Itching.    glucose blood (TRUE METRIX BLOOD GLUCOSE TEST) test strip     HYDROmorphone (DILAUDID) 4 MG tablet Take 1 tablet (4 mg) by mouth 4 times daily as needed.    hydroxychloroquine (PLAQUENIL) 200 MG tablet Take 1 tablet (200 mg) by mouth daily.    Lancets (STERILANCE TL) MISC     latanoprost (XALATAN) 0.005 % ophthalmic solution Place 1 drop into both eyes every evening.    levothyroxine (SYNTHROID) 88 MCG tablet Take 1 tablet (88 mcg) by mouth every morning (before breakfast).    lidocaine (LIDODERM) 5 % patch      losartan (COZAAR) 25 MG tablet Take 1 tablet (25 mg) by mouth daily.    Melatonin 5 MG TBDP     Misc Natural Products (AIRBORNE ELDERBERRY PO)     MOVANTIK 25 MG TABS tablet Take 1 tablet (25 mg) by mouth daily.    nitroGLYcerin (NITROSTAT) 0.4 MG SL tablet 1 tablet (0.4 mg) by Sublingual route every 5 minutes as needed for Chest Pain. UP TO 3 PER EPISODE; keep one vial in fridge and one on person    ondansetron (ZOFRAN) 8 MG tablet Take 1 tablet (8 mg) by mouth every 8 hours as needed for Nausea/Vomiting.    pantoprazole (PROTONIX) 40 MG tablet Take 1 tablet (40 mg) by mouth daily.    timolol hemihydrate (BETIMOL) 0.5 % ophthalmic solution Place 1 drop into both eyes 2 times daily. use in affected eye(s)    vitamin D3 (VITAMIN D) 50 MCG (2000 UT) tablet Take 2.5 tablets (5,000 Units) by mouth daily.     No current facility-administered medications for this visit.       Allergies:  Patient is allergic to ceftriaxone, iodine, iv contrast [contrast media], latex, metformin, ativan [lorazepam], definity [perflutren lipid microsphere], diclofenac, ibuprofen, januvia [sitagliptin], jardiance [empagliflozin], macrobid [nitrofurantoin], and tylenol [acetaminophen].    Physical Exam:  BP 116/73 (BP Location: Right arm, BP Patient Position: Sitting, BP cuff size: Regular)   Pulse 69   Temp 98.1 F (36.7 C) (Temporal)   Resp 16   Ht 5\' 5"  (1.651 m)   Wt 83.5 kg (184 lb 1.4 oz)   LMP  (LMP Unknown)   SpO2 98%   BMI 30.63 kg/m  /    General: awake, alert, no acute distress  HEENT: NCAT, EOMI  Neck: JVP ~7cm, no carotid bruits  Lungs: clear to auscultation bilaterally, no wheezing/crackles  Heart: RRR, nl S1S2, no murmurs, rubs or gallops,healed sternotomy   Abdomen: +BS, soft, non-tender, non-distended  Extremities: warm, well-perfused, no edema,  2+ symmetric radial pulses  Skin: dry, no rashes  Neuro: alert and oriented x3, no focal neurologic deficit    Lab Data (reviewed by me today):  Lab Results   Component  Value Date    BUN 17 07/02/2020    CREAT 0.74 07/02/2020    CL 103 07/02/2020    NA 138 07/02/2020    K 4.2 07/02/2020     10.0 07/02/2020    TBILI 0.30 07/02/2020    ALB 4.4 07/02/2020    TP 7.1 07/02/2020    AST 12 07/02/2020    ALK 92 07/02/2020    BICARB 24 07/02/2020    ALT 9 07/02/2020    GLU 137 (H) 07/02/2020     Lab Results   Component Value Date    WBC 7.3 07/02/2020    RBC 4.64 07/02/2020    HGB 13.4 07/02/2020    HCT 41.1 07/02/2020    MCV 88.6 07/02/2020    MCHC 32.6 07/02/2020    RDW 14.6 (H) 07/02/2020    PLT 218 07/02/2020    MPV 10.5 07/02/2020     Lab Results   Component Value Date    CHOL 151 03/10/2022    HDL 62 03/10/2022    LDLCALC 61 07/02/2020    TRIG 150 (H) 03/10/2022     Lab Results   Component Value Date    TSH 8.82 (H) 07/02/2020     Diagnostics (I reviewed all the test results below today):  EKG 03/22/20:  Normal sinus rhythm     Echo 11/11/21   1. The left ventricular size is normal. The left ventricular systolic function is normal. Left ventricular ejection fraction by Simpson's biplane is 57 %.   2. The right ventricular size is normal and systolic function is normal.   3. Pulmonary artery pressure could not be measured because there was insufficient tricuspid regurgitation.   4. S/p bioprosthetic mitral valve with a mean gradient of 8 mmHg at a heart rate of 67 bpm.   5. No sigificant gradient across LVOT.   6. Compared to prior study no significant change.    Echo 04/16/20:   1. The left ventricular size is normal. The left ventricular systolic function is normal.   2. Indeterminate left ventricular diastolic function.   3. The right ventricular size is normal and systolic function is reduced.   4. Normal pulmonary artery pressure with right ventricular systolic pressure measuring 24 mmHg based on estimated RA pressure of 3 mmHg.   5. 26 mm Sapien 3 valve in valve prosthesis is visualized. MV prosthesis juts into the LVOT, no elevated gradients across LVOT. Mean gradient of 8 mmhg  at HR of 68 bpm consistent with moderate mitral stenosis.   6. Compared to prior study no significant change.    Event Monitor 05/18/20:   No significant pauses.   No atrial fibrillation.   Rare premature atrial complexes, <1 % burden.   Rare premature ventricular complexes, <1 % burden     Echo 2019:   The left ventricle is normal in size. The left ventricular ejection fraction is 55-60% by   visual estimation.   Estimated pulmonary arterial pressure is normal.   The right ventricle is normal size. The right ventricular function is grossly normal.   There is a prosthetic mitral valve. SAM noted due to TAVR valve in mitral position. There   is trace mitral regurgitation.   There is mild tricuspid regurgitation.      ECHO 2018    1. The left ventricular size is  normal and the left ventricular systolic function is normal.   2. The right ventricular size is mildly enlarged and systolic function is reduced.   3. S/p 26 mm Sapien 3 valve in the mitral position with mean gradient of 7 mm Hg.   4. Cannot estimate RVSP due to trace TR jet.   5. Compared to prior study 12/27/16 no change in mitral valve graidnet.     CTA Coronary 11/2016  1. No obstructive coronary artery disease.  2. 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  3. Bioprosthetic mitral valve abnormal valve leaflet motion. Internal mitral annulus diameter is 22 x 22 mm.  4. Please see report for concurrently performed CT of the chest for discussion of extracardiac findings in the thorax.          Assessment/Plan:  72 year old female with endocarditis s/p MVR who presents for follow up.     # Endocarditis/MVR:    s/p bovine bioprosthetic MVR in 2012 s/p Sapien 3 in mitral position 2018. Finished 6 mo of warfarin. Now on DAPT per interventional team due to mitral position of the valve  - continue aspirin 81 mg qday and plavix 75 mg qday  - will need antibiotics before any dental work  - prescribed  today  - Moderate MS mean gradient of 8 mmHg, stable on 2023 echo  - Continue coreg 25 mg BID  - next echo 10/2022 - ordered today    # PAF:   No known recurrence recently. S/p LAA Closure + MAZE. Pt is off anticoagulation. We discussed previously that this strategy is not studied. Pt is OK to stay on DAPT only  - Continue Coreg 25 mg BID  - DAPT as above  - advised for any CVA/TIA symptoms to go to ED for evaluation     # HTN  At goal. If BP trends lower, may need to lower losartan dose again. Continue coreg due to mitral gradient  - Continue losartan 25 mg daily + Coreg 25 mg BID    #. Dyslipidemia:   Lipids at goal.   - Goal LDL <70, continue Atorvastatin 20 mg daily.       # Pulmonary HTN  Not noted on most recent echo. Likely due to MV disease previously and improved with MVR.       # DM2  Needs good control to reduce risk of cardiovascular complications. A1c reportedly improving. Follows with new PCP for control.       Patient Instructions   Schedule echocardiogram    Follow up after      Return in about 4 months (around 10/28/2022).    My above recommendations will be communicated with the referring physician by way of letter or the electronic medical record.    Please don't hesitate to page me should you have any questions regarding the care of this patient.      Clemencia Course, MD  Cardiology

## 2022-06-27 NOTE — Telephone Encounter (Signed)
[facility] pharmacy calling to clarify order for pt's   nitroGLYcerin (NITROSTAT) 0.4 MG SL tablet.  States in order to provide patient one vial to keep in fridge & one vial to keep on person as ordered, the qty needs to be changed to 50.

## 2022-06-28 MED ORDER — NITROGLYCERIN 0.4 MG SL SUBL
0.4000 mg | SUBLINGUAL_TABLET | SUBLINGUAL | 3 refills | Status: AC | PRN
Start: 2022-06-28 — End: ?

## 2022-06-28 NOTE — Telephone Encounter (Signed)
RX prescription for Nitro changed to reflect 50 count to provide patient one vial to keep in fridge & one vial to keep on person as ordered     Routing to Dr Sharol Given to approve

## 2022-06-29 ENCOUNTER — Telehealth (HOSPITAL_COMMUNITY): Payer: Self-pay

## 2022-06-29 ENCOUNTER — Telehealth (HOSPITAL_COMMUNITY): Payer: Self-pay | Admitting: Internal Medicine

## 2022-06-29 NOTE — Telephone Encounter (Signed)
Received above message fm Call Center via Secure Chat.:  Hi Cris. I have [facility] pharmacy on the line calling to inquire about pt's script order for nitrostat that was sent in by Dr. Sharol Given, P. yesterday 5/7. orders read to keep one vial on person and one vial in fridge. Pharmacy states that medication is intended to be stored at room temp and can become unstable if stored in fridge. Are you able to clarify instructions?   Spoke to [employer] and clarified to store medication at room temp.

## 2022-06-29 NOTE — Telephone Encounter (Signed)
[place] from [facility] pharmacy calling to clarify pt's script order for nitrostat that was sent in by Dr. Sharol Given, P. yesterday 5/7. orders read to keep one vial on person and one vial in fridge. Pharmacy states that medication is intended to be stored at room temp and can become unstable if stored in fridge. Warm transferred to RN Cris for further clarification.

## 2022-06-29 NOTE — Telephone Encounter (Signed)
Addressed in separate encounter.

## 2022-09-28 ENCOUNTER — Ambulatory Visit (INDEPENDENT_AMBULATORY_CARE_PROVIDER_SITE_OTHER): Payer: Medicare Other

## 2022-09-28 DIAGNOSIS — I10 Essential (primary) hypertension: Secondary | ICD-10-CM

## 2022-09-28 DIAGNOSIS — Z952 Presence of prosthetic heart valve: Secondary | ICD-10-CM

## 2022-10-04 LAB — 2D ECHO WITH IMAGE ENHANCEMENT AGENT IF NECESSARY
IVC Diameter: 1.3 cm
LA Diameter: 4.3 cm
LA Volume Index: 28.5 ml/m2
LV Diastolic Diameter: 4.25 cm
LV Diastolic Volume Index: 35 ml/m2
LV Diastolic Volume: 67.2 ml
LV Ejection Fraction: 65 %
LV Systolic Diameter: 2.41 cm
LV Systolic Volume Index: 12 ml/m2
LV Systolic Volume: 23.5 ml
Mitral Mean Gradient: 11 mmHg
PA Pressure: 37 mmHg
TR Velocity: 2.93 m/s

## 2022-10-31 ENCOUNTER — Ambulatory Visit (INDEPENDENT_AMBULATORY_CARE_PROVIDER_SITE_OTHER): Payer: Medicare Other | Admitting: Internal Medicine

## 2022-10-31 VITALS — BP 113/58 | HR 70 | Temp 97.5°F | Resp 16 | Ht 65.0 in | Wt 189.0 lb

## 2022-10-31 DIAGNOSIS — I509 Heart failure, unspecified: Secondary | ICD-10-CM

## 2022-10-31 DIAGNOSIS — Z952 Presence of prosthetic heart valve: Secondary | ICD-10-CM

## 2022-10-31 DIAGNOSIS — E785 Hyperlipidemia, unspecified: Secondary | ICD-10-CM

## 2022-10-31 DIAGNOSIS — I10 Essential (primary) hypertension: Secondary | ICD-10-CM

## 2022-10-31 DIAGNOSIS — I480 Paroxysmal atrial fibrillation: Secondary | ICD-10-CM

## 2022-10-31 MED ORDER — ASPIRIN 81 MG OR TBEC
81.0000 mg | DELAYED_RELEASE_TABLET | Freq: Every day | ORAL | 3 refills | Status: DC
Start: 2022-10-31 — End: 2023-11-19

## 2022-10-31 MED ORDER — ATORVASTATIN CALCIUM 20 MG OR TABS
20.0000 mg | ORAL_TABLET | Freq: Every day | ORAL | 3 refills | Status: AC
Start: 2022-10-31 — End: ?

## 2022-10-31 MED ORDER — CARVEDILOL 25 MG OR TABS
25.0000 mg | ORAL_TABLET | Freq: Two times a day (BID) | ORAL | 3 refills | Status: AC
Start: 2022-10-31 — End: ?

## 2022-10-31 NOTE — Patient Instructions (Signed)
Schedule echo for March.  Follow up with me after    Around early March, please get non-fasting labs at [facility]

## 2022-10-31 NOTE — Progress Notes (Signed)
CARDIOLOGY CLINIC     Referring provider: Marni Griffon    CC: MVR     History of Presenting Illness:   Cathy Lucas is a 72 year old female with history of MVR s/p bovine bioprosthetic MVR in 2012 s/p Sapien 3 mitral valve placement 2018 with 8 mmHg residual gradient, pAfib s/p MAZE and LAA surgical closure off AC, HTN, HLD, DM2, and RA who presents for follow up. Prev saw Dr. Flonnie Hailstone.      Interval hx:  She reports feeling fine. Tired. Pain is under better control.   Denies any SOB. No pedal edema.  No palpitations  Walking on treadmill for 30 mins at a time, mainly limited by RA flares.         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       Past Medical / Surgical History:  Past Medical History:   Diagnosis Date    Back pain     Needs L4 fusion    Depression     Diabetes (CMS-HCC)     Fibromyalgia     GERD (gastroesophageal reflux disease)     Glaucoma     Hypercholesterolemia     Hypertension     Hypothyroidism     Migraines     PAF (paroxysmal atrial fibrillation) (CMS-HCC)        Social History:  Social History     Socioeconomic History    Marital status: Married     Spouse name: Not on file    Number of children: Not on file    Years of education: Not on file    Highest education level: Not on file   Occupational History    Not on file   Tobacco Use    Smoking status: Never    Smokeless tobacco: Never   Substance and Sexual Activity    Alcohol use: No    Drug use: No    Sexual activity: Not on file   Other Topics Concern    Caffeine Concern Not Asked   Social History Narrative    Not on file     Social Determinants of Health     Financial Resource Strain: Not on file   Food Insecurity: Not on file    Transportation Needs: Not on file   Physical Activity: Not on file   Stress: Not on file   Social Connections: Not on file   Intimate Partner Violence: Not on file   Housing Stability: Not on file       Family History:  Family History   Problem Relation Name Age of Onset    Stroke Mother      Heart Disease Mother      Stroke Father      Heart Disease Father         Medications (medication list reviewed by me with the patient):  Current Outpatient Medications   Medication Sig    amoxicillin (AMOXIL) 500 MG tablet Take 4 tablets (2,000 mg) by mouth once as needed (take 30-60 minutes before dental work) for up to 1 dose.    aspirin 81 MG EC tablet Take  1 tablet (81 mg) by mouth daily.    atorvastatin (LIPITOR) 20 MG tablet Take 1 tablet (20 mg) by mouth daily.    Buprenorphine HCl 150 MCG FILM Suck 1 Film (150 mcg) in mouth 2 times daily.    carvedilol (COREG) 25 MG tablet Take 1 tablet (25 mg) by mouth 2 times daily (with meals).    clopidogrel (PLAVIX) 75 MG tablet Take 1 tablet (75 mg) by mouth daily.    cyclobenzaprine (FLEXERIL) 10 MG tablet Take 1 tablet (10 mg) by mouth as needed.    diphenhydrAMINE (BENADRYL) 25 MG capsule Take 2 capsules (50 mg) by mouth every 6 hours as needed for Itching.    glucose blood (TRUE METRIX BLOOD GLUCOSE TEST) test strip     HYDROmorphone (DILAUDID) 4 MG tablet Take 1 tablet (4 mg) by mouth 4 times daily as needed.    hydroxychloroquine (PLAQUENIL) 200 MG tablet Take 1 tablet (200 mg) by mouth daily.    Lancets (STERILANCE TL) MISC     latanoprost (XALATAN) 0.005 % ophthalmic solution Place 1 drop into both eyes every evening.    levothyroxine (SYNTHROID) 88 MCG tablet Take 1 tablet (88 mcg) by mouth every morning (before breakfast).    lidocaine (LIDODERM) 5 % patch     losartan (COZAAR) 25 MG tablet Take 1 tablet (25 mg) by mouth daily.    Melatonin 5 MG TBDP     Misc Natural Products (AIRBORNE ELDERBERRY PO)     MOVANTIK 25 MG TABS tablet Take 1 tablet (25 mg) by mouth daily.     nitroGLYcerin (NITROSTAT) 0.4 MG SL tablet 1 tablet (0.4 mg) by Sublingual route every 5 minutes as needed for Chest Pain. UP TO 3 PER EPISODE; keep one vial in fridge and one on person    ondansetron (ZOFRAN) 8 MG tablet Take 1 tablet (8 mg) by mouth every 8 hours as needed for Nausea/Vomiting.    pantoprazole (PROTONIX) 40 MG tablet Take 1 tablet (40 mg) by mouth daily.    timolol hemihydrate (BETIMOL) 0.5 % ophthalmic solution Place 1 drop into both eyes 2 times daily. use in affected eye(s)    vitamin D3 (VITAMIN D) 50 MCG (2000 UT) tablet Take 2.5 tablets (5,000 Units) by mouth daily.     No current facility-administered medications for this visit.       Allergies:  Patient is allergic to ceftriaxone, iodine, iv contrast [contrast media], latex, metformin, ativan [lorazepam], definity [perflutren lipid microsphere], diclofenac, ibuprofen, januvia [sitagliptin], jardiance [empagliflozin], macrobid [nitrofurantoin], and tylenol [acetaminophen].    Physical Exam:  BP (!) 113/58 (BP Location: Right arm, BP Patient Position: Sitting, BP cuff size: Large)   Pulse 70   Temp 97.5 F (36.4 C) (Temporal)   Resp 16   Ht 5\' 5"  (1.651 m)   Wt 85.7 kg (189 lb)   LMP  (LMP Unknown)   SpO2 97%   BMI 31.45 kg/m  /    General: awake, alert, no acute distress  HEENT: NCAT, EOMI  Neck: JVP ~7cm  Lungs: clear to auscultation bilaterally, no wheezing/crackles  Heart: RRR, nl S1S2, no murmurs, rubs or gallops  Abdomen: +BS, soft, non-tender, non-distended  Extremities: warm, well-perfused, no edema,  2+ symmetric radial pulses  Skin: dry, no rashes  Neuro: alert and oriented x3, no focal neurologic deficit    Lab Data (reviewed by me today):  Lab Results   Component Value Date    BUN 17 07/02/2020    CREAT 0.74 07/02/2020  CL 103 07/02/2020    NA 138 07/02/2020    K 4.2 07/02/2020    [place] 10.0 07/02/2020    TBILI 0.30 07/02/2020    ALB 4.4 07/02/2020    TP 7.1 07/02/2020    AST 12 07/02/2020    ALK 92 07/02/2020     BICARB 24 07/02/2020    ALT 9 07/02/2020    GLU 137 (H) 07/02/2020     Lab Results   Component Value Date    WBC 7.3 07/02/2020    RBC 4.64 07/02/2020    HGB 13.4 07/02/2020    HCT 41.1 07/02/2020    MCV 88.6 07/02/2020    MCHC 32.6 07/02/2020    RDW 14.6 (H) 07/02/2020    PLT 218 07/02/2020    MPV 10.5 07/02/2020     Lab Results   Component Value Date    CHOL 151 03/10/2022    HDL 62 03/10/2022    LDLCALC 61 07/02/2020    TRIG 150 (H) 03/10/2022     Lab Results   Component Value Date    TSH 8.82 (H) 07/02/2020     Diagnostics (I reviewed all the test results below today):  EKG 03/22/20:  Normal sinus rhythm     Echo 11/11/21   1. The left ventricular size is normal. The left ventricular systolic function is normal. Left ventricular ejection fraction by Simpson's biplane is 57 %.   2. The right ventricular size is normal and systolic function is normal.   3. Pulmonary artery pressure could not be measured because there was insufficient tricuspid regurgitation.   4. S/p bioprosthetic mitral valve with a mean gradient of 8 mmHg at a heart rate of 67 bpm.   5. No sigificant gradient across LVOT.   6. Compared to prior study no significant change.    Echo 04/16/20:   1. The left ventricular size is normal. The left ventricular systolic function is normal.   2. Indeterminate left ventricular diastolic function.   3. The right ventricular size is normal and systolic function is reduced.   4. Normal pulmonary artery pressure with right ventricular systolic pressure measuring 24 mmHg based on estimated RA pressure of 3 mmHg.   5. 26 mm Sapien 3 valve in valve prosthesis is visualized. MV prosthesis juts into the LVOT, no elevated gradients across LVOT. Mean gradient of 8 mmhg at HR of 68 bpm consistent with moderate mitral stenosis.   6. Compared to prior study no significant change.    Event Monitor 05/18/20:   No significant pauses.   No atrial fibrillation.   Rare premature atrial complexes, <1 % burden.   Rare premature  ventricular complexes, <1 % burden     Echo 2019:   The left ventricle is normal in size. The left ventricular ejection fraction is 55-60% by   visual estimation.   Estimated pulmonary arterial pressure is normal.   The right ventricle is normal size. The right ventricular function is grossly normal.   There is a prosthetic mitral valve. SAM noted due to TAVR valve in mitral position. There   is trace mitral regurgitation.   There is mild tricuspid regurgitation.      ECHO 2018    1. The left ventricular size is normal and the left ventricular systolic function is normal.   2. The right ventricular size is mildly enlarged and systolic function is reduced.   3. S/p 26 mm Sapien 3 valve in the mitral position with mean gradient of  7 mm Hg.   4. Cannot estimate RVSP due to trace TR jet.   5. Compared to prior study 12/27/16 no change in mitral valve graidnet.     CTA Coronary 11/2016  1. No obstructive coronary artery disease.  2. 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  3. Bioprosthetic mitral valve abnormal valve leaflet motion. Internal mitral annulus diameter is 22 x 22 mm.  4. Please see report for concurrently performed CT of the chest for discussion of extracardiac findings in the thorax.    Echo 09/28/22  Summary:   1. The left ventricular size is normal. The left ventricular systolic function is normal. Left ventricular ejection fraction by Simpson's biplane is 65 %.   2. The right ventricular size is normal and systolic function is normal.   3. Mildly elevated pulmonary artery pressure with right ventricular systolic pressure of 37 mmHg using an estimated right atrial pressure of 3 mmHg.   4. Bioprosthetic mitral valve is in a stable position (extends slightly into LVOT without significant obstruction) and a mean mitral gradient of 11 mmHg at a heart rate of 74 bpm.   5. Compared to prior study MV gradient is slightly higher with a higher heart  rate. Bioprosthesis is otherwise well seated.      Assessment/Plan:  72 year old female with endocarditis s/p MVR who presents for follow up.     # Endocarditis/MVR:   s/p bovine bioprosthetic MVR in 2012 s/p Sapien 3 in mitral position 2018. Finished 6 mo of warfarin. Now on DAPT per interventional team due to mitral position of the valve  - continue aspirin 81 mg qday and plavix 75 mg qday  - will need antibiotics before any dental work  - prescribed  - Moderate MS mean gradient of 11 mmHg with higher HR. Repeat echo again in 6 months  - Continue coreg 25 mg BID  - check hgb prior to next visit/echo    # PAF:   No known recurrence recently. S/p LAA Closure + MAZE. Pt is off anticoagulation. We discussed previously that this strategy is not studied. Pt is OK to stay on DAPT only  - Continue Coreg 25 mg BID  - DAPT as above  - advised for any CVA/TIA symptoms to go to ED for evaluation     # HTN  At goal. If BP trends lower, may need to lower losartan dose again. Continue coreg due to mitral gradient  - Continue losartan 25 mg daily + Coreg 25 mg BID    #. Dyslipidemia:   Lipids at goal.   - Goal LDL <70, continue Atorvastatin 20 mg daily.       # Pulmonary HTN  Not noted on most recent echo. Likely due to MV disease previously and improved with MVR.       # DM2  Needs good control to reduce risk of cardiovascular complications. A1c reportedly improving. Follows with new PCP for control.       There are no Patient Instructions on file for this visit.  No follow-ups on file.    My above recommendations will be communicated with the referring physician by way of letter or the electronic medical record.    Please don't hesitate to page me should you have any questions regarding the care of this patient.      Clemencia Course, MD  Cardiology

## 2023-03-02 IMAGING — MR RM CRANIO
10 of 14 series · 25 of 48 positions shown · non-contrast
Comparison: none

[Series 301: T1 · sagittal · 5.0mm · 0.45mm/px · 2 of 18 slices shown (1 of 2)]
[im 1/18]
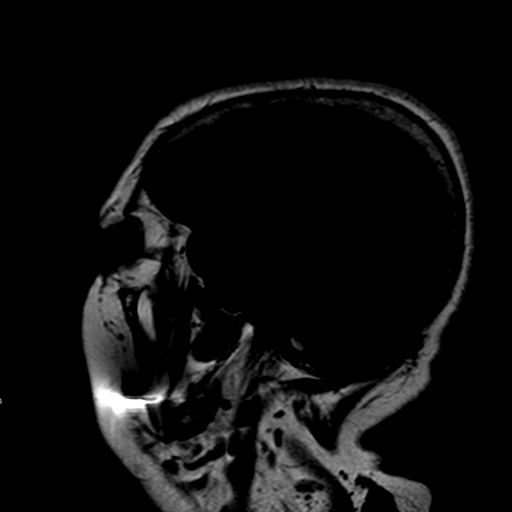
[im 18/18]
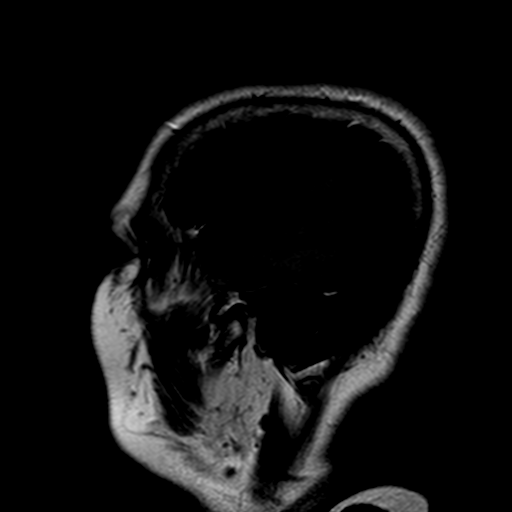

[Series 401: 3d_flair_shc · sagittal · 1.1mm · 1.04mm/px · 8 of 309 slices shown]
[im 19/309]
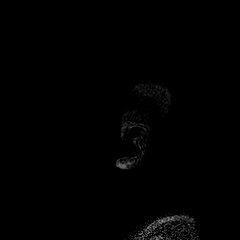
[im 55/309]
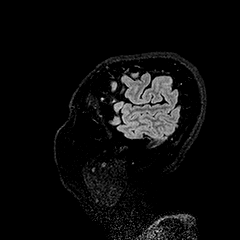
[im 91/309]
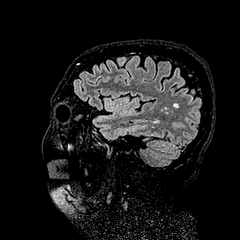
[im 127/309]
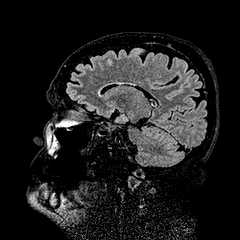
[im 182/309]
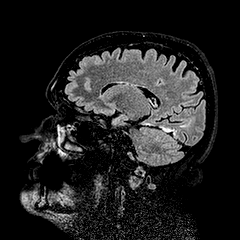
[im 218/309]
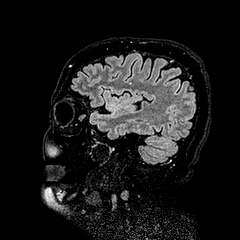
[im 254/309]
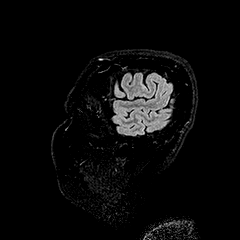
[im 290/309]
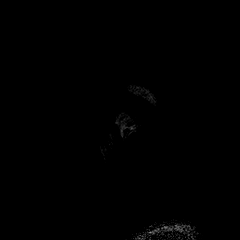

[Series 403: mpr · axial · 5.0mm · 1.04mm/px · z∈[-53,+88]mm · 2 of 25 slices shown]
[im 1/25]
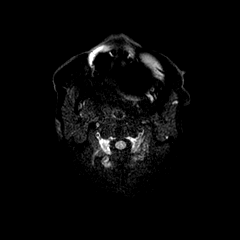
[im 25/25]
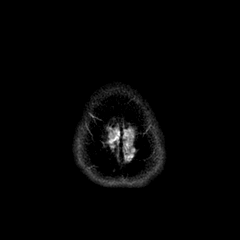

[Series 501: T2 · axial · 5.0mm · 0.30mm/px · z∈[-37,+96]mm · 2 of 24 slices shown]
[im 1/24]
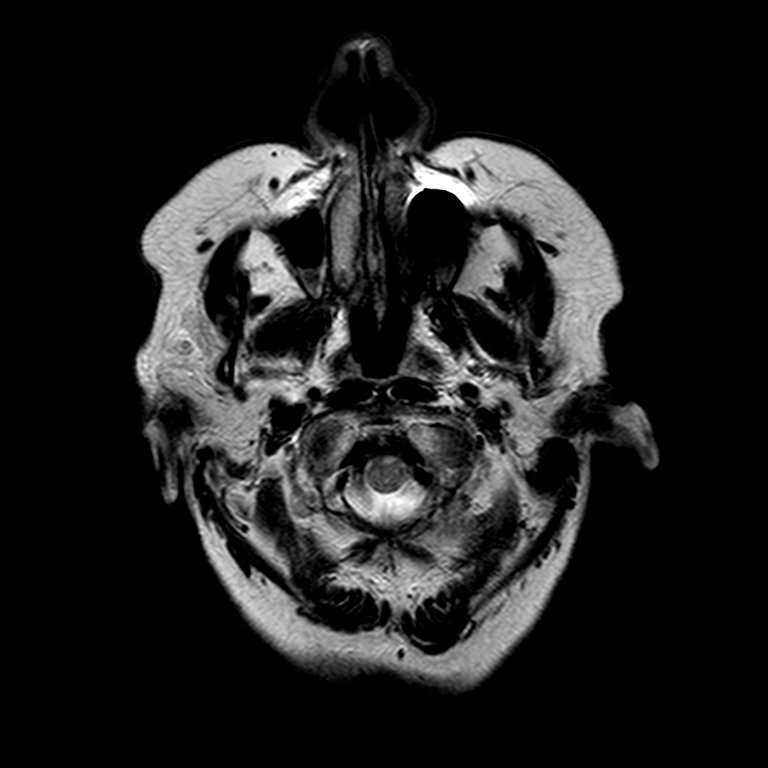
[im 24/24]
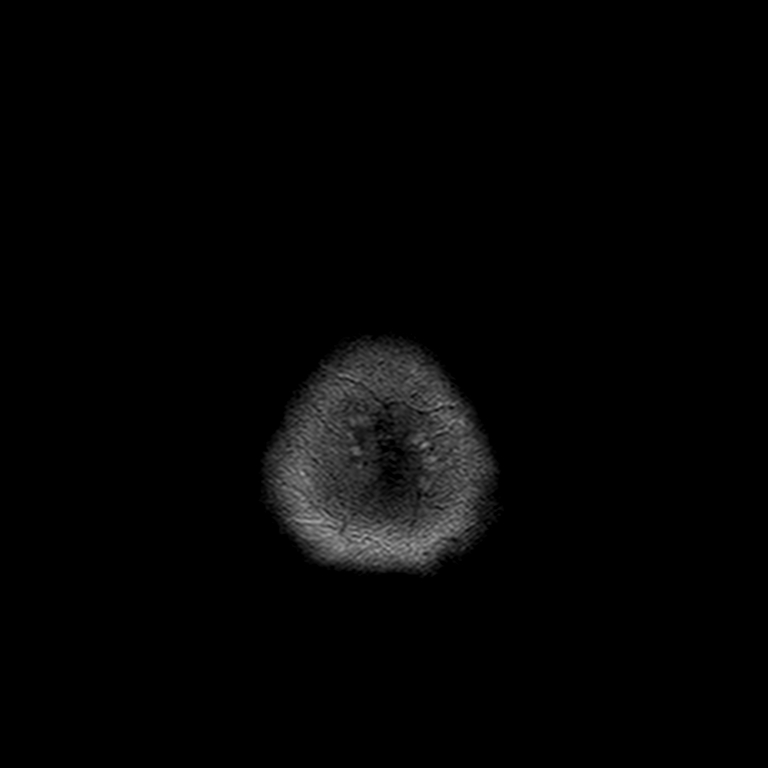

[Series 601: axi mffe · axial · 5.0mm · 0.45mm/px · z∈[-43,+96]mm · 4 of 74 slices shown]
[im 1/74]
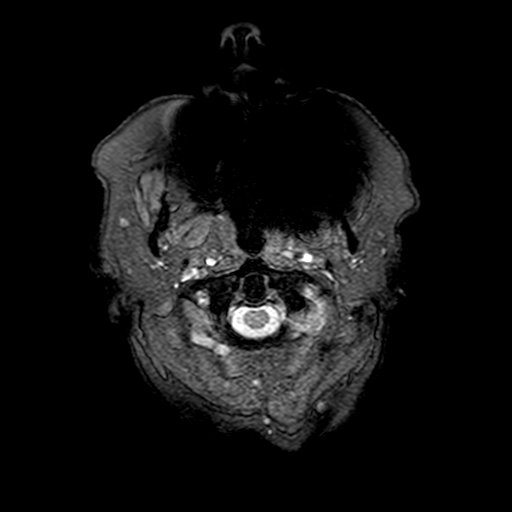
[im 25/74]
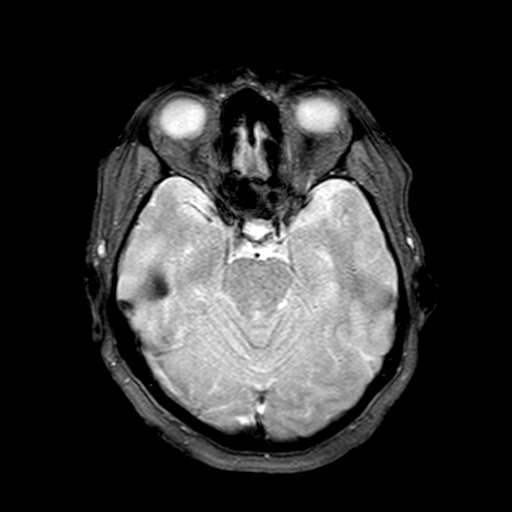
[im 49/74]
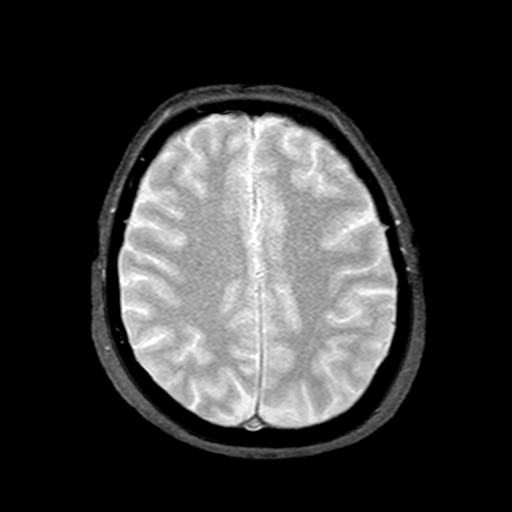
[im 74/74]
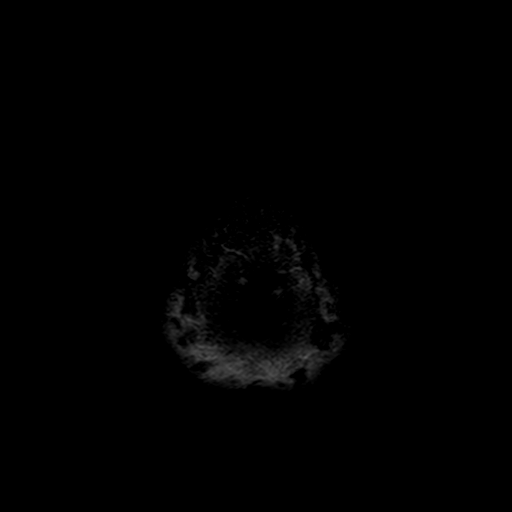

[Series 602: smffe sense · axial · 5.0mm · 0.45mm/px · 1 of 25 slices shown]
[im 1/25]
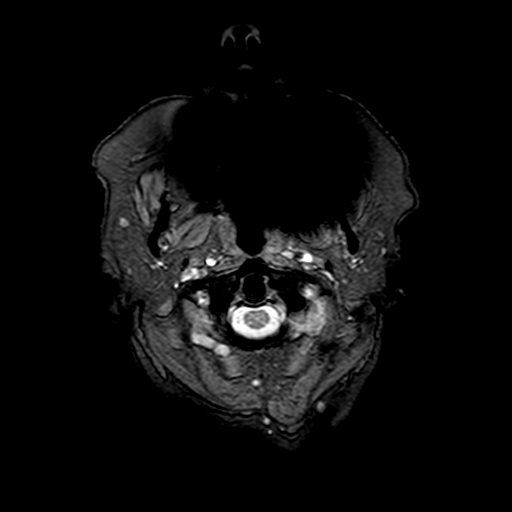

[Series 701: axi difusao · axial · 5.5mm · 0.60mm/px · 1 of 45 slices shown]
[im 1/45]
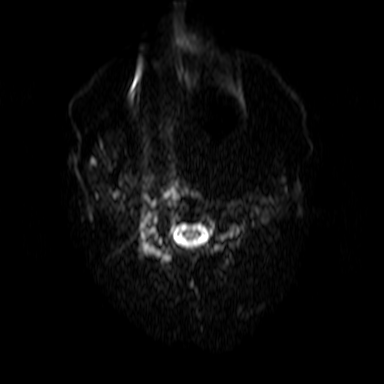

[Series 801: STIR · coronal · 3.0mm · 0.38mm/px · 2 of 45 slices shown (1 of 2)]
[im 1/45]
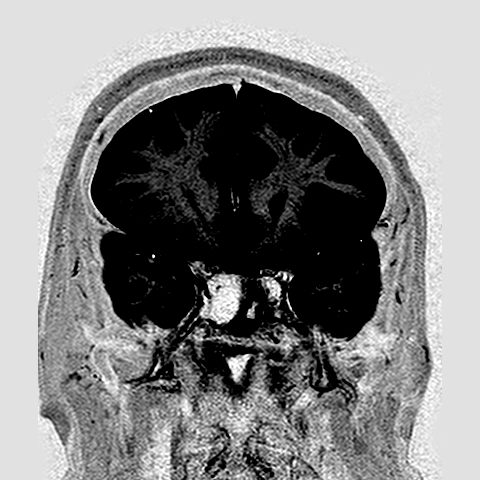
[im 45/45]
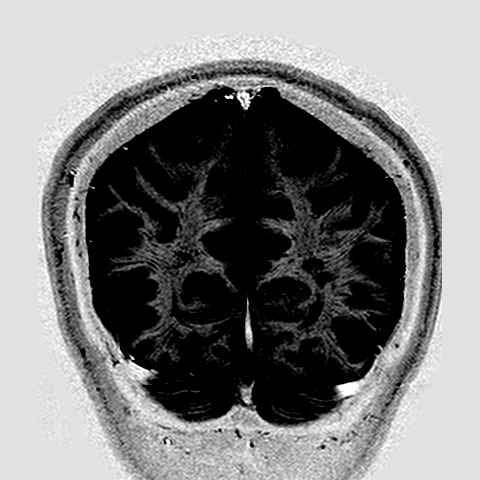

[Series 802: STIR · coronal · 3.0mm · 0.38mm/px · 2 of 45 slices shown (2 of 2)]
[im 1/45]
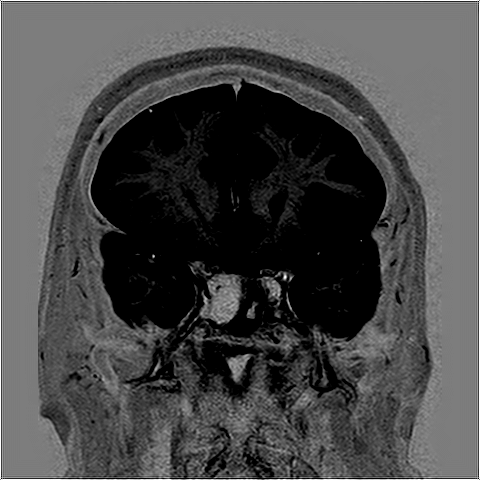
[im 45/45]
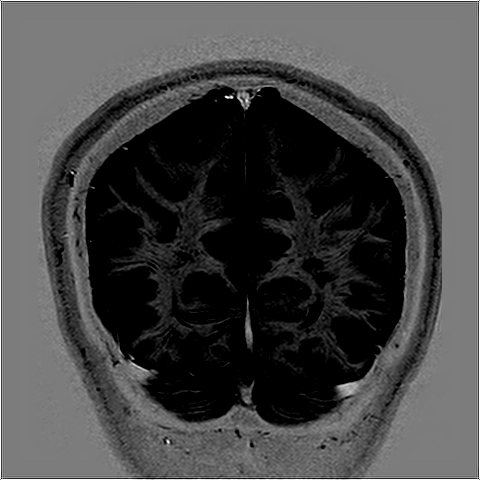

[Series 1001: T1 · axial · 5.0mm · 0.45mm/px · 1 of 24 slices shown (2 of 2)]
[im 1/24]
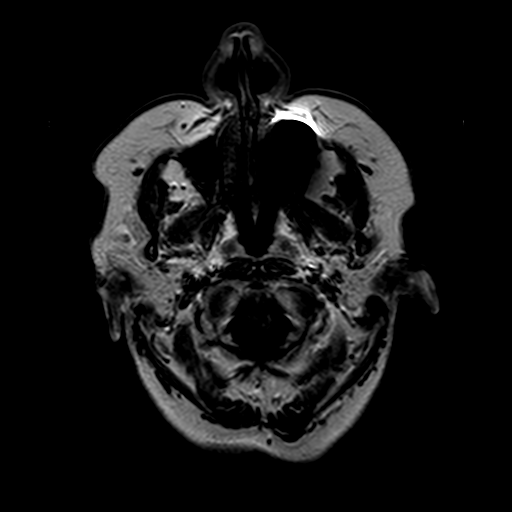

[25 of 48 positions shown; findings below may reference images not displayed]

METODOLOGIA:
Sequências multiplanares, ponderadas em T1 e T2, antes e após a administração intravenosa do agente de
contraste paramagnético.
ANÁLISE:
Área de encefalomalácia / gliose no aspecto posterior do giro occipitotemporal medial direito, sequelar.
Não há evidência de processo expansivo intracraniano, hemorragia intraparenquimatosa aguda, isquemia
RESSONÂNCIA MAGNÉTICA DO ENCÉFALO
aguda/subaguda, bem como de coleções líquidas extra-axiais acima ou abaixo do tentório.
Discretos sinais de redução volumétrica encefálica difusa com leve ectasia compensatória do sistema
ventricular não importante para a faixa etária.
Áreas hiperintensas em T2/FLAIR comprometendo a substância branca periventricular, subcortical e do centro
semioval e ambos os hemisférios cerebrais, relacionadas a fenômenos isquêmicos decorrentes de doença dos
pequenos vasos (microangiopatia - [place] 1).
Não evidenciamos áreas com impregnação anômala pelo gadolínio.
Não se identificam áreas com restrição a difusão da água na sequência ECOPLANAR.
Fluxo habitual ao nível das grandes artérias do sistema vértebro basilar e carotídeo, segundo o critério Spin-
Echo.
Espessamento mucoso do seio esfenoidal esquerdo.
IMPRESSÃO:
Área de encefalomalácia / gliose no aspecto posterior do giro occipitotemporal medial direito, sequelar.
Discretos sinais de redução volumétrica encefálica difusa não importante para a faixa etária.
Sinais de discreta microangiopatia supratentorial.
Espessamento mucoso do seio esfenoidal esquerdo.

## 2023-04-11 ENCOUNTER — Other Ambulatory Visit (INDEPENDENT_AMBULATORY_CARE_PROVIDER_SITE_OTHER): Payer: Self-pay | Admitting: Internal Medicine

## 2023-04-11 DIAGNOSIS — I10 Essential (primary) hypertension: Secondary | ICD-10-CM

## 2023-04-17 NOTE — Telephone Encounter (Signed)
 Received Rx refill request from: Pharmacy  Requested Medication(s):  Requested Prescriptions     Pending Prescriptions Disp Refills    losartan (COZAAR) 25 MG tablet [Pharmacy Med Name: Losartan Potassium Oral Tablet 25 MG] 90 tablet 3     Sig: TAKE ONE TABLET BY MOUTH ONE TIME DAILY     Last visit in this department: 10/31/2022  Future Appointments   Date Time Provider Department Center   04/30/2023 10:00 AM [facility] ECHO ROOM [facility] [place]   05/07/2023  1:00 PM Jasmine December, MD [facility] [place]     Preferred Pharmacy:   [id number] - [address] [place] - [address]  [address]  [place] [address]  Phone: [phone] Fax: [phone]    Last clinic notes/medication list reviewed?: Yes    - Continue losartan 25 mg daily + Coreg 25 mg BID   Any changes since last refill request: No  Last 3 BP:   Blood Pressure   10/31/22 (!) 113/58   06/27/22 116/73   12/27/21 105/64        Lab Results   Component Value Date    GLU 137 (H) 07/02/2020    BUN 17 07/02/2020    CREAT 0.74 07/02/2020    EGFRCKDEPI >60 07/02/2020    NA 138 07/02/2020    K 4.2 07/02/2020    CL 103 07/02/2020    BICARB 24 07/02/2020    ANION 11 07/02/2020    [place] 10.0 07/02/2020    TP 7.1 07/02/2020    ALB 4.4 07/02/2020    TBILI 0.30 07/02/2020    AST 12 07/02/2020    ALT 9 07/02/2020    ALK 92 07/02/2020     Lab Results   Component Value Date    TSH 8.82 (H) 07/02/2020     No results found for: "DIG"    Medication/s pended and Routed to: Dr Sharol Given

## 2023-04-30 ENCOUNTER — Ambulatory Visit (INDEPENDENT_AMBULATORY_CARE_PROVIDER_SITE_OTHER): Payer: Medicare Other

## 2023-04-30 DIAGNOSIS — I480 Paroxysmal atrial fibrillation: Secondary | ICD-10-CM

## 2023-04-30 DIAGNOSIS — Z952 Presence of prosthetic heart valve: Secondary | ICD-10-CM

## 2023-04-30 DIAGNOSIS — Z9889 Other specified postprocedural states: Secondary | ICD-10-CM

## 2023-05-02 LAB — 2D ECHO WITH IMAGE ENHANCEMENT AGENT IF NECESSARY
Ao Asc Diameter (cm): 3.3 cm
Ao Asc Index (cm/m2): 1.81 cm/m2
Ao Root Diameter (cm): 3 cm
Ao Root Index (cm/m2): 1.64 cm/m2
IVC Diameter: 1.23 cm
IVS Diameter (cm): 1.01 cm
LA Diameter: 4.4 cm
LA Volume Index: 21.9 mL/m2
LV Diastolic Diameter: 4.35 cm
LV Diastolic Volume Index: 30 mL/m2
LV Diastolic Volume: 55.4 mL
LV Ejection Fraction: 60 %
LV Systolic Diameter: 3.29 cm
LV Systolic Volume Index: 12 mL/m2
LV Systolic Volume: 22.3 mL
Mitral Mean Gradient: 7.7 mmHg
PA Pressure: 38 mmHg
TR Velocity: 2.97 m/s

## 2023-05-07 ENCOUNTER — Ambulatory Visit (INDEPENDENT_AMBULATORY_CARE_PROVIDER_SITE_OTHER): Payer: Medicare Other | Admitting: Internal Medicine

## 2023-05-07 VITALS — BP 133/61 | HR 75 | Temp 98.1°F | Resp 16 | Ht 65.0 in | Wt 192.0 lb

## 2023-05-07 DIAGNOSIS — Z952 Presence of prosthetic heart valve: Secondary | ICD-10-CM

## 2023-05-07 DIAGNOSIS — I480 Paroxysmal atrial fibrillation: Secondary | ICD-10-CM

## 2023-05-07 DIAGNOSIS — E785 Hyperlipidemia, unspecified: Secondary | ICD-10-CM

## 2023-05-07 DIAGNOSIS — I10 Essential (primary) hypertension: Secondary | ICD-10-CM

## 2023-05-07 MED ORDER — AMOXICILLIN 500 MG OR TABS
2000.0000 mg | ORAL_TABLET | Freq: Once | ORAL | 3 refills | Status: DC | PRN
Start: 2023-05-07 — End: 2023-11-19

## 2023-05-07 NOTE — Progress Notes (Signed)
 CARDIOLOGY CLINIC     Referring provider: Lucien Mozelle Plume    CC: MVR     History of Presenting Illness:   Cathy Lucas is a 73 year old female with history of MVR s/p bovine bioprosthetic MVR in 2012 s/p Sapien 3 mitral valve placement 2018 with 8 mmHg residual gradient, pAfib s/p MAZE and LAA surgical closure off AC, HTN, HLD, DM2, and RA who presents for follow up.       Interval hx:  She notes possible RA flare recently and then sick with a cold.   Clemens coming into clinic today. Son helped slow the fall.   Son had personal medical issues so he was unable to help her as much recently and pt has gotten weaker.   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       Past Medical / Surgical History:  Past Medical History:   Diagnosis Date    Back pain     Needs L4 fusion    Depression     Diabetes     Fibromyalgia     GERD (gastroesophageal reflux disease)     Glaucoma     Hypercholesterolemia     Hypertension     Hypothyroidism     Migraines     PAF (paroxysmal atrial fibrillation)        Social History:  Social History     Socioeconomic History    Marital status: Married     Spouse name: Not on file    Number of children: Not on file    Years of education: Not on file    Highest education level: Not on file   Occupational History    Not on file   Tobacco Use    Smoking status: Never    Smokeless tobacco: Never   Substance and Sexual Activity    Alcohol use: No    Drug use: No    Sexual activity: Not on file   Other Topics Concern    Caffeine Concern Not Asked   Social History Narrative    Not on file     Social Determinants of  Health     Financial Resource Strain: Not on file   Food Insecurity: Not on file   Transportation Needs: Not on file   Physical Activity: Not on file   Stress: Not on file   Social Connections: Not on file   Intimate Partner Violence: Not on file   Housing Stability: Not on file       Family History:  Family History   Problem Relation Name Age of Onset    Stroke Mother      Heart Disease Mother      Stroke Father      Heart Disease Father         Medications (medication list reviewed by me with the patient):   amoxicillin  Take 4 tablets (2,000 mg) by mouth once as needed (take 30-60 minutes before dental work) for up to 1 dose. 4 tablet 3  aspirin  Take 1 tablet (81 mg) by mouth daily. 90 tablet 3    atorvastatin  Take 1 tablet (20 mg) by mouth daily. 90 tablet 3    buprenorphine  Suck 1 Film (150 mcg) in mouth 2 times daily.      carvedilol  Take 1 tablet (25 mg) by mouth 2 times daily (with meals). 180 tablet 3    clopidogrel  Take 1 tablet (75 mg) by mouth daily. 90 tablet 3    cyclobenzaprine  Take 1 tablet (10 mg) by mouth as needed.      diphenhydrAMINE  Take 2 capsules (50 mg) by mouth every 6 hours as needed for Itching. 30 capsule 3    True Metrix Blood Glucose Test       HYDROmorphone  Take 1 tablet (4 mg) by mouth 4 times daily as needed.      hydroxychloroquine  Take 1 tablet (200 mg) by mouth daily.      SteriLance TL       latanoprost  Place 1 drop into both eyes every evening.      levothyroxine  Take 1 tablet (88 mcg) by mouth every morning (before breakfast).      lidocaine        losartan  TAKE ONE TABLET BY MOUTH ONE TIME DAILY 90 tablet 3    Melatonin       Misc Natural Products (AIRBORNE ELDERBERRY PO)       Movantik  Take 1 tablet (25 mg) by mouth daily.      nitroGLYcerin  1 tablet (0.4 mg) by Sublingual route every 5 minutes as needed for Chest Pain. UP TO 3 PER EPISODE; keep one vial in fridge and one on person 50 tablet 3    ondansetron  Take 1 tablet (8 mg) by mouth every 8 hours as needed for  Nausea/Vomiting. 15 tablet 1    pantoprazole  Take 1 tablet (40 mg) by mouth daily.      timolol  hemihydrate Place 1 drop into both eyes 2 times daily. use in affected eye(s)      vitamin D3 Take 2.5 tablets (5,000 Units) by mouth daily.         Allergies:  Patient is allergic to ceftriaxone, iodine , iv contrast [contrast media], latex, metformin, ativan [lorazepam], definity  [perflutren  lipid microsphere], diclofenac, ibuprofen , januvia [sitagliptin], jardiance  [empagliflozin ], macrobid [nitrofurantoin], and tylenol  [acetaminophen ].    Physical Exam:  BP 133/61 (BP Location: Right arm, BP Patient Position: Sitting, BP cuff size: Large)   Pulse 75   Temp 98.1 F (36.7 C) (Temporal)   Resp 16   Ht 5' 5 (1.651 m)   Wt 87.1 kg (192 lb)   LMP  (LMP Unknown)   SpO2 95%   BMI 31.95 kg/m  /      General: awake, alert, no acute distress  HEENT: NCAT, EOMI  Neck: JVP ~7cm  Lungs: clear to auscultation bilaterally, no wheezing/crackles  Heart: RRR, nl S1S2, no murmurs, rubs or gallops  Extremities: warm, well-perfused, no edema,  2+ symmetric radial pulses  Skin: dry, no rashes  Neuro: alert and oriented x3, no focal neurologic deficit    Lab Data (reviewed by me today):  Lab Results   Component Value Date    BUN 17 07/02/2020    CREAT 0.74 07/02/2020    CL 103 07/02/2020    NA 138 07/02/2020    K 4.2 07/02/2020    CA 10.0 07/02/2020    TBILI 0.30 07/02/2020    ALB 4.4 07/02/2020    TP 7.1 07/02/2020    AST 12  07/02/2020    ALK 92 07/02/2020    BICARB 24 07/02/2020    ALT 9 07/02/2020    GLU 137 (H) 07/02/2020     Lab Results   Component Value Date    WBC 7.3 07/02/2020    RBC 4.64 07/02/2020    HGB 13.4 07/02/2020    HCT 41.1 07/02/2020    MCV 88.6 07/02/2020    MCHC 32.6 07/02/2020    RDW 14.6 (H) 07/02/2020    PLT 218 07/02/2020    MPV 10.5 07/02/2020     Lab Results   Component Value Date    CHOL 151 03/10/2022    HDL 62 03/10/2022    LDLCALC 61 07/02/2020    TRIG 150 (H) 03/10/2022     Lab Results   Component  Value Date    TSH 8.82 (H) 07/02/2020     Diagnostics (I reviewed all the test results below today):  EKG 03/22/20:  Normal sinus rhythm     CTA Coronary 11/2016  1. No obstructive coronary artery disease.  2. 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  3. Bioprosthetic mitral valve abnormal valve leaflet motion. Internal mitral annulus diameter is 22 x 22 mm.  4. Please see report for concurrently performed CT of the chest for discussion of extracardiac findings in the thorax.    Echo 09/28/22  Summary:   1. The left ventricular size is normal. The left ventricular systolic function is normal. Left ventricular ejection fraction by Simpson's biplane is 65 %.   2. The right ventricular size is normal and systolic function is normal.   3. Mildly elevated pulmonary artery pressure with right ventricular systolic pressure of 37 mmHg using an estimated right atrial pressure of 3 mmHg.   4. Bioprosthetic mitral valve is in a stable position (extends slightly into LVOT without significant obstruction) and a mean mitral gradient of 11 mmHg at a heart rate of 74 bpm.   5. Compared to prior study MV gradient is slightly higher with a higher heart rate. Bioprosthesis is otherwise well seated.    Echo 04/30/23   1. The left ventricular size is normal. The left ventricular systolic function is normal. Left ventricular ejection fraction by Simpson's biplane is 60 %.   2. The right ventricular size is normal and systolic function is normal.   3. Mildly elevated pulmonary artery pressure with right ventricular systolic pressure of 38 mmHg using an estimated right atrial pressure of 3 mmHg.   4. Bioprosthetic mitral valve is well-seated without regurgitation. Mean diastolic gradient is 7.7 mmHg at a heart rate of 71 bpm.   5. Compared to prior study mean mitral gradient is lower.       Assessment/Plan:  73 year old female with endocarditis s/p MVR who presents for follow up.      # Endocarditis/MVR:   s/p bovine bioprosthetic MVR in 2012 s/p Sapien 3 in mitral position 2018. Finished 6 mo of warfarin. Now on DAPT per interventional team due to mitral position of the valve  - continue aspirin  81 mg qday and plavix  75 mg qday  - will need antibiotics before any dental work  - prescribed  - Moderate MS mean gradient of 7.7 mmH - stable  - Continue coreg  25 mg BID    # PAF:   No known recurrence recently. S/p LAA Closure + MAZE. Pt is off anticoagulation. We discussed previously  that this strategy is not studied. Pt is OK to stay on DAPT only  - Continue Coreg  25 mg BID  - DAPT as above  - advised for any CVA/TIA symptoms to go to ED for evaluation     # HTN  At goal. If BP trends lower, may need to lower losartan  dose again. Continue coreg  due to mitral gradient  - Continue losartan  25 mg daily + Coreg  25 mg BID    #. Dyslipidemia:   Lipids at goal.   - Goal LDL <70, continue Atorvastatin  20 mg daily.     # fall/weakness  Has been sick recently. RA flares make it hard to exercise at times. Highly recommend discussing getting a PT eval close to home for stability/fall prevention      # Pulmonary HTN  Mild on recent echo. Prev improved significantly after MVR      # DM2  Needs good control to reduce risk of cardiovascular complications. A1c reportedly improving. Follows with new PCP for control. Pt instructed to follow up with PCP since last A1c was high.       Patient Instructions   Stay on current cardiac meds.     Need to get annual labs. Please make sure your PCP orders these before your annual visit.    Talk to your PCP about doing physical therapy for strength training.       Return in about 1 year (around 05/06/2024).    My above recommendations will be communicated with the referring physician by way of letter or the electronic medical record.    Please don't hesitate to page me should you have any questions regarding the care of this patient.      Mozelle Shark, MD  Cardiology

## 2023-05-07 NOTE — Patient Instructions (Signed)
 Stay on current cardiac meds.     Need to get annual labs. Please make sure your PCP orders these before your annual visit.    Talk to your PCP about doing physical therapy for strength training.

## 2023-07-09 ENCOUNTER — Other Ambulatory Visit (INDEPENDENT_AMBULATORY_CARE_PROVIDER_SITE_OTHER): Payer: Self-pay | Admitting: Internal Medicine

## 2023-07-09 DIAGNOSIS — Z952 Presence of prosthetic heart valve: Secondary | ICD-10-CM

## 2023-07-11 NOTE — Telephone Encounter (Signed)
 Received RX refill request from: pharmacy    Medication(s): plavox  Last Cardio visit: 05/07/2023  Next Cardio appt: 11/19/2023    Pharmacy : [facility]    Blood Pressure   05/07/23 133/61   10/31/22 (!) 113/58   06/27/22 116/73        Last Clinic notes/Medication list reviewed: yes    Labs checked:   Lab Results   Component Value Date    NA 138 07/02/2020    K 4.2 07/02/2020    CL 103 07/02/2020    BICARB 24 07/02/2020    BUN 17 07/02/2020    CREAT 0.74 07/02/2020    GLU 137 (H) 07/02/2020    CA 10.0 07/02/2020         Medication/s pended and Routed to: Dr. Lucien, P    Patient Comment:

## 2023-07-30 ENCOUNTER — Other Ambulatory Visit (INDEPENDENT_AMBULATORY_CARE_PROVIDER_SITE_OTHER): Payer: Self-pay | Admitting: Internal Medicine

## 2023-07-30 DIAGNOSIS — E785 Hyperlipidemia, unspecified: Secondary | ICD-10-CM

## 2023-08-07 NOTE — Telephone Encounter (Signed)
 Received Rx refill request from: Pharmacy  Requested Medication(s):  Requested Prescriptions     Pending Prescriptions Disp Refills    atorvastatin  (LIPITOR) 20 MG tablet [Pharmacy Med Name: Atorvastatin  Calcium  Oral Tablet 20 MG] 90 tablet 3     Sig: TAKE ONE TABLET BY MOUTH ONE TIME DAILY     Last visit in this department: 05/07/2023  Future Appointments   Date Time Provider Department Center   11/19/2023  1:00 PM Lucien Mozelle Plume, MD [facility] CARD[facility] [place]     Preferred Pharmacy:   [facility] # [address], [place] - [address]  [address]  [place] [address]  Phone: [phone] Fax: [phone]    Last clinic notes/medication list reviewed?: Yes    - Goal LDL <70, continue Atorvastatin  20 mg daily.   Any changes since last refill request: No  Last 3 BP:   Blood Pressure   05/07/23 133/61   10/31/22 (!) 113/58   06/27/22 116/73        Lab Results   Component Value Date    GLU 137 (H) 07/02/2020    BUN 17 07/02/2020    CREAT 0.74 07/02/2020    EGFRCKDEPI >60 07/02/2020    NA 138 07/02/2020    K 4.2 07/02/2020    CL 103 07/02/2020    BICARB 24 07/02/2020    ANION 11 07/02/2020    CA 10.0 07/02/2020    TP 7.1 07/02/2020    ALB 4.4 07/02/2020    TBILI 0.30 07/02/2020    AST 12 07/02/2020    ALT 9 07/02/2020    ALK 92 07/02/2020     Lab Results   Component Value Date    TSH 8.82 (H) 07/02/2020     No results found for: DIG    Medication/s pended and Routed to: Dr Lucien

## 2023-08-29 ENCOUNTER — Other Ambulatory Visit (INDEPENDENT_AMBULATORY_CARE_PROVIDER_SITE_OTHER): Payer: Self-pay | Admitting: Internal Medicine

## 2023-08-29 DIAGNOSIS — I480 Paroxysmal atrial fibrillation: Secondary | ICD-10-CM

## 2023-08-29 DIAGNOSIS — Z952 Presence of prosthetic heart valve: Secondary | ICD-10-CM

## 2023-08-29 DIAGNOSIS — I509 Heart failure, unspecified: Secondary | ICD-10-CM

## 2023-08-30 NOTE — Telephone Encounter (Signed)
 Received Rx refill request from: Pharmacy  Requested Medication(s):  Requested Prescriptions     Pending Prescriptions Disp Refills    carvedilol  (COREG ) 25 MG tablet [Pharmacy Med Name: Carvedilol  Oral Tablet 25 MG] 180 tablet 3     Sig: TAKE ONE TABLET BY MOUTH TWICE DAILY (with meals)     Last visit in this department: 05/07/2023  Future Appointments   Date Time Provider Department Center   11/19/2023  1:00 PM Lucien Mozelle Plume, MD [facility] CARD[facility] [place]     Preferred Pharmacy:   [facility] # [address], [place] - [address]  [address]  [place] [address]  Phone: [phone] Fax: [phone]    Last clinic notes/medication list reviewed?: Yes  - Continue coreg  25 mg BID   Any changes since last refill request: No  Last 3 BP:   Blood Pressure   05/07/23 133/61   10/31/22 (!) 113/58   06/27/22 116/73        Lab Results   Component Value Date    GLU 137 (H) 07/02/2020    BUN 17 07/02/2020    CREAT 0.74 07/02/2020    EGFRCKDEPI >60 07/02/2020    NA 138 07/02/2020    K 4.2 07/02/2020    CL 103 07/02/2020    BICARB 24 07/02/2020    ANION 11 07/02/2020    CA 10.0 07/02/2020    TP 7.1 07/02/2020    ALB 4.4 07/02/2020    TBILI 0.30 07/02/2020    AST 12 07/02/2020    ALT 9 07/02/2020    ALK 92 07/02/2020     Lab Results   Component Value Date    TSH 8.82 (H) 07/02/2020     No results found for: DIG    Medication/s pended and Routed to: Dr Lucien

## 2023-09-04 ENCOUNTER — Ambulatory Visit (HOSPITAL_COMMUNITY): Payer: Self-pay | Admitting: Internal Medicine

## 2023-09-04 DIAGNOSIS — R600 Localized edema: Secondary | ICD-10-CM

## 2023-09-04 DIAGNOSIS — Z952 Presence of prosthetic heart valve: Secondary | ICD-10-CM

## 2023-09-04 NOTE — Telephone Encounter (Signed)
 Reason for Call: Symptoms    Summary of Call:    I called the son back as requested. I asked about the Lasix ; the last rx fill was 2018. I suggested they not use it until I speak to MD>. He states it resolved her symptoms. She has no currently symptoms. She has rheumatoid arthritis that is flaring as well. She had the aching as below. She took Lasix  20 mg Friday, Saturday and then Sunday. She states her bp is better with Lasix  prn as well as bilat LE edema. Will route to MD for further recs. They have f/u scheduled with RA MD on 10/11/23. They are not sure if sxs are RA related vs cardiac.      Bp today 130/75; p 76 bpm.       Patient's son called to report that his mother began experiencing swelling below the knees on Friday. Her blood pressure at that time was 147/62 with a pulse of 75 around 7:11 PM. A subsequent reading at 8:02 PM was 137/67 with a pulse of 80. She was given Lasix , and by 8:32 PM her BP improved to 133/71 with a pulse of 74. Symptoms continued to improve over the weekend. Prior to the onset of swelling, she reported sore and achy hands beginning approximately three days earlier. The patient has since stopped taking Lasix , and her blood pressure readings have remained stable--138/77 on Sunday and later dropped to 123/?. Son is unsure if further action is needed and is requesting guidance. Please advise.          Summary of last Cardiology clinic note:   .Assessment/Plan:  73 year old female with endocarditis s/p MVR who presents for follow up.      # Endocarditis/MVR:   s/p bovine bioprosthetic MVR in 2012 s/p Sapien 3 in mitral position 2018. Finished 6 mo of warfarin. Now on DAPT per interventional team due to mitral position of the valve  - continue aspirin  81 mg qday and plavix  75 mg qday  - will need antibiotics before any dental work  - prescribed  - Moderate MS mean gradient of 7.7 mmH - stable  - Continue coreg  25 mg BID     # PAF:   No known recurrence recently. S/p LAA Closure + MAZE. Pt  is off anticoagulation. We discussed previously that this strategy is not studied. Pt is OK to stay on DAPT only  - Continue Coreg  25 mg BID  - DAPT as above  - advised for any CVA/TIA symptoms to go to ED for evaluation      # HTN  At goal. If BP trends lower, may need to lower losartan  dose again. Continue coreg  due to mitral gradient  - Continue losartan  25 mg daily + Coreg  25 mg BID     #. Dyslipidemia:   Lipids at goal.   - Goal LDL <70, continue Atorvastatin  20 mg daily.      # fall/weakness  Has been sick recently. RA flares make it hard to exercise at times. Highly recommend discussing getting a PT eval close to home for stability/fall prevention        # Pulmonary HTN  Mild on recent echo. Prev improved significantly after MVR        # DM2  Needs good control to reduce risk of cardiovascular complications. A1c reportedly improving. Follows with new PCP for control. Pt instructed to follow up with PCP since last A1c was high.   Summary of last cardiology testing  done (echo, cath, EP procedure, surgery, event monitor): see notes  List of cardiac medications that the patient is taking at home:  Asa 81 mg daily  Lipitor 20 mg daily  Coreg  25 mg bid  Plavix  75 mg daily  Losartan  25 mg daily    Recent home blood pressure readings: see note  Date of last discharge from the hospital:    Disposition:   See PCP Within 2 Weeks       PROVIDER ACTION REQUESTED: YES, please review and advise.   RN ACTION: Advice given and Routing to Provider for review      Reason for Disposition   [1] MILD swelling of both ankles (i.e., pedal edema) AND [2] caused by hot weather    Additional Information   Negative: Difficulty breathing at rest   Negative: Entire foot is cool or blue in comparison to other side   Negative: [1] Can't walk or can barely walk AND [2] new-onset   Negative: [1] Difficulty breathing with exertion (e.g., walking) AND [2] new-onset or worsening   Negative: [1] Red area or streak AND [2] fever   Negative: [1]  Swelling is painful to touch AND [2] fever   Negative: [1] Cast on leg or ankle AND [2] now increased pain   Negative: Patient sounds very sick or weak to the triager   Negative: SEVERE leg swelling (e.g., swelling extends above knee, entire leg is swollen, weeping fluid)   Negative: [1] Red area or streak [2] large (> 2 in. or 5 cm)   Negative: [1] Thigh or calf pain AND [2] only 1 side AND [3] present > 1 hour   Negative: [1] Thigh, calf, or ankle swelling AND [2] only 1 side   Negative: [1] Thigh, calf, or ankle swelling AND [2] bilateral AND [3] 1 side is more swollen   Negative: [1] MODERATE leg swelling (e.g., swelling extends up to knees) AND [2] new-onset or worsening   Negative: Swelling of face, arm or hands  (Exception: Slight puffiness of fingers occurring during hot weather.)   Negative: Looks like a boil, infected sore, deep ulcer or other infected rash (spreading redness, pus)   Negative: [1] MILD swelling of both ankles (i.e., pedal edema) AND [2] new-onset or worsening    Protocols used: Leg Swelling and Edema-A-AH

## 2023-09-04 NOTE — Telephone Encounter (Signed)
 Patient's son called to report that his mother began experiencing swelling below the knees on Friday. Her blood pressure at that time was 147/62 with a pulse of 75 around 7:11 PM. A subsequent reading at 8:02 PM was 137/67 with a pulse of 80. She was given Lasix , and by 8:32 PM her BP improved to 133/71 with a pulse of 74. Symptoms continued to improve over the weekend. Prior to the onset of swelling, she reported sore and achy hands beginning approximately three days earlier. The patient has since stopped taking Lasix , and her blood pressure readings have remained stable--138/77 on Sunday and later dropped to 123/?. Son is unsure if further action is needed and is requesting guidance. Please advise.

## 2023-09-04 NOTE — Telephone Encounter (Addendum)
 I called the son back as requested. I asked about the Lasix ; the last rx fill was 2018. I suggested they not use it until I speak to MD>. He states it resolved her symptoms. She has no currently symptoms. She has rheumatoid arthritis that is flaring as well. She had the aching as below. She took Lasix  20 mg Friday, Saturday and then Sunday. She states her bp is better with Lasix  prn as well as bilat LE edema.     Bp today 130/75; p 76 bpm.            Patient's son called to report that his mother began experiencing swelling below the knees on Friday. Her blood pressure at that time was 147/62 with a pulse of 75 around 7:11 PM. A subsequent reading at 8:02 PM was 137/67 with a pulse of 80. She was given Lasix , and by 8:32 PM her BP improved to 133/71 with a pulse of 74. Symptoms continued to improve over the weekend. Prior to the onset of swelling, she reported sore and achy hands beginning approximately three days earlier. The patient has since stopped taking Lasix , and her blood pressure readings have remained stable--138/77 on Sunday and later dropped to 123/?. Son is unsure if further action is needed and is requesting guidance. Please advise.

## 2023-09-05 MED ORDER — FUROSEMIDE 20 MG OR TABS
20.0000 mg | ORAL_TABLET | Freq: Every day | ORAL | 3 refills | Status: AC | PRN
Start: 2023-09-05 — End: ?

## 2023-09-06 NOTE — Telephone Encounter (Addendum)
 I called the patient to relay recommendations below. The patient verbalized understanding and has no further questions or concerns.        Cathy Mozelle Plume, MD  [name], RN  Caller: Unspecified (2 days ago,  2:45 PM)  Thanks! I ordered PRN lasix  in case they need to use it. Sounds like a one-off event so we can continue to monitor as you explained.          Previous Messages       ----- Message -----  From: [name], RN  Sent: 09/04/2023   4:44 PM PDT  To: [name], MD    ----- Message from Corean Jansky Kathaleen, RN sent at 09/04/2023  4:44 PM PDT -----  Please see note. Pt had some swelling of ankles over the weekend. Took expired PRN Lasix . Symptoms resolved. Would you want to prescribe to use PRN> Pt also has RA and has f/u 8/21. Thank you, S  sTeph

## 2023-10-29 IMAGING — MR ANGIORM ARTERIAL DE PESCOCO
3 of 4 series · 9 of 48 positions shown · non-contrast
Comparison: none

[Series 501: T1 post-contrast · axial · 3.0mm · 0.47mm/px · z∈[-41,+49]mm · 3 of 26 slices shown]
[im 1/26]
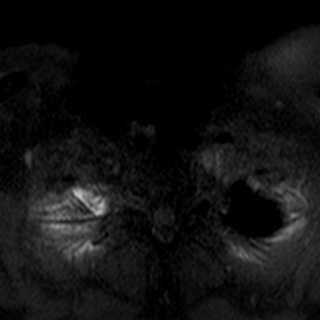
[im 13/26]
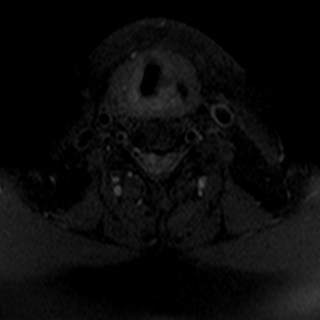
[im 26/26]
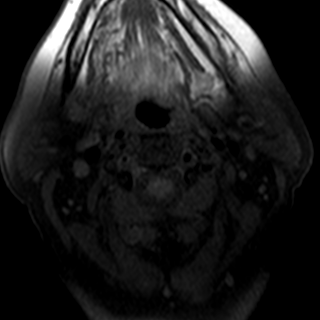

[Series 601: s3d_carotids · coronal · 1.0mm · 0.46mm/px · 3 of 318 slices shown]
[im 49/318]
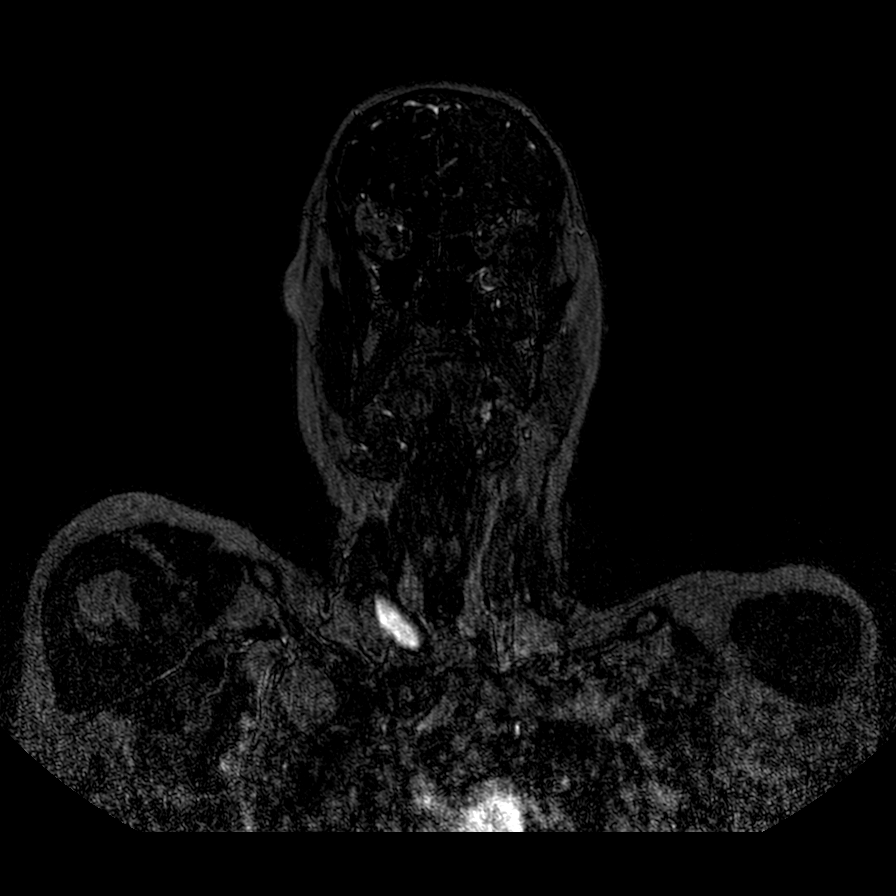
[im 159/318]
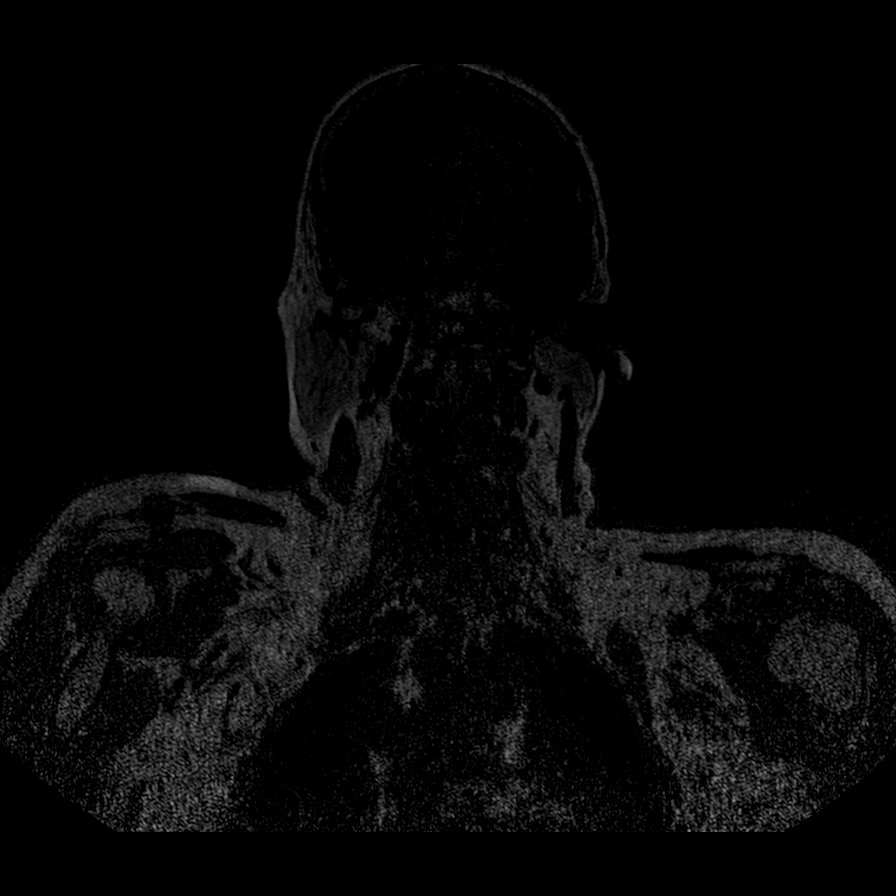
[im 269/318]
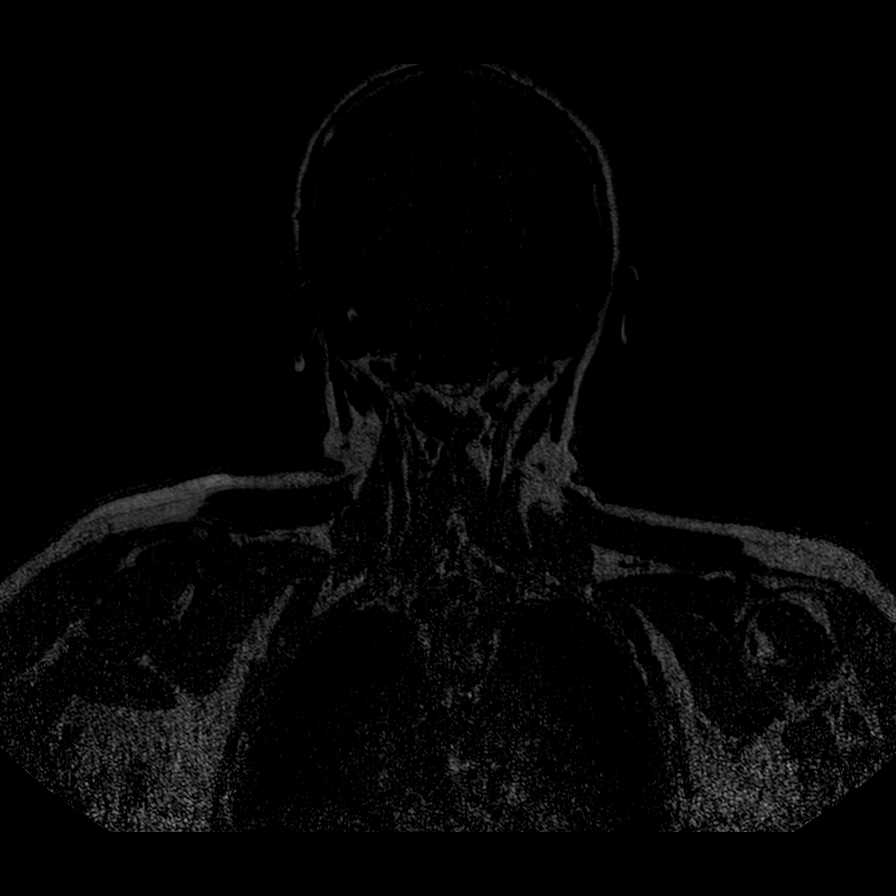

[Series 602: sangio cervical · coronal · 1.0mm · 0.46mm/px · 3 of 159 slices shown]
[im 27/159]
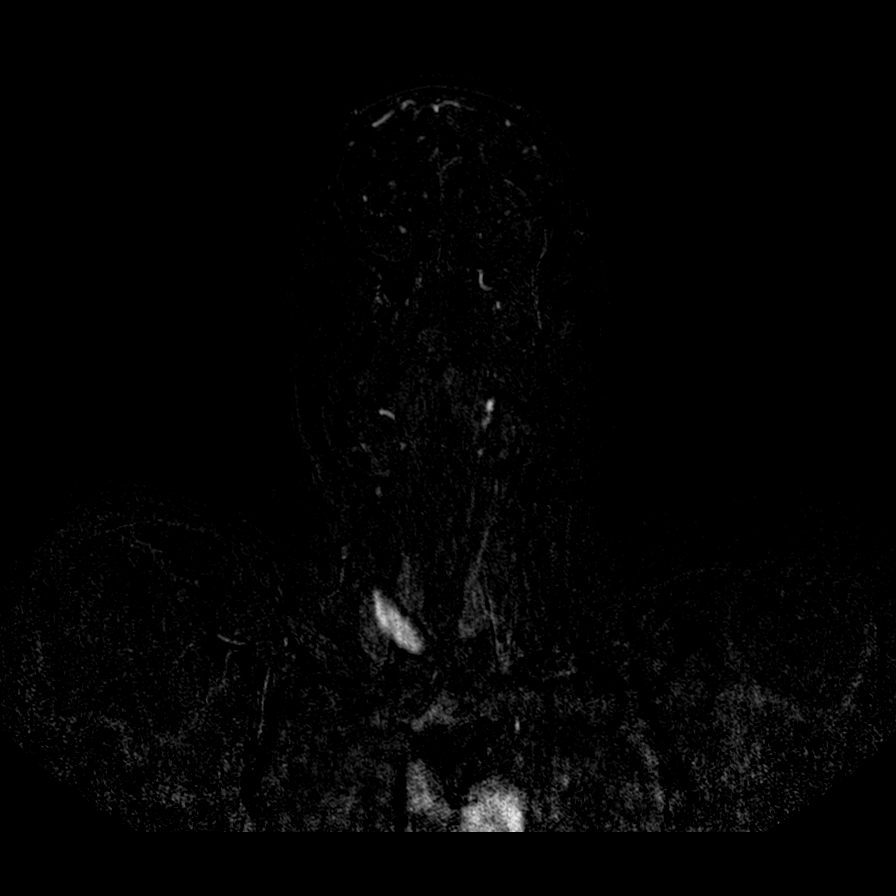
[im 80/159]
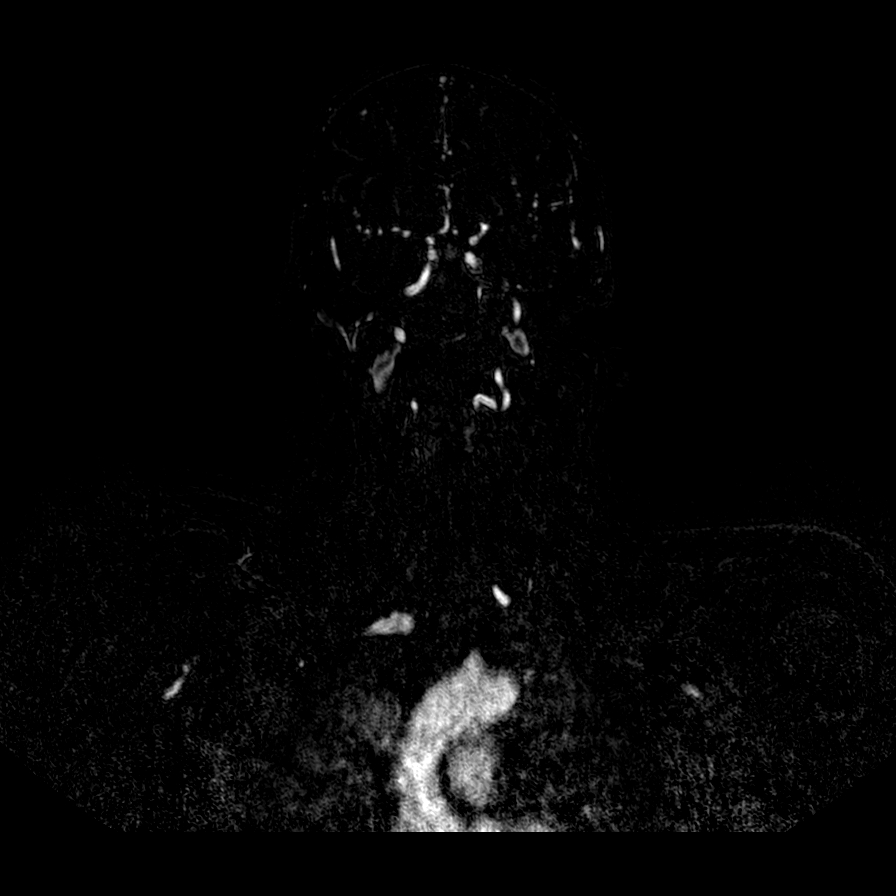
[im 132/159]
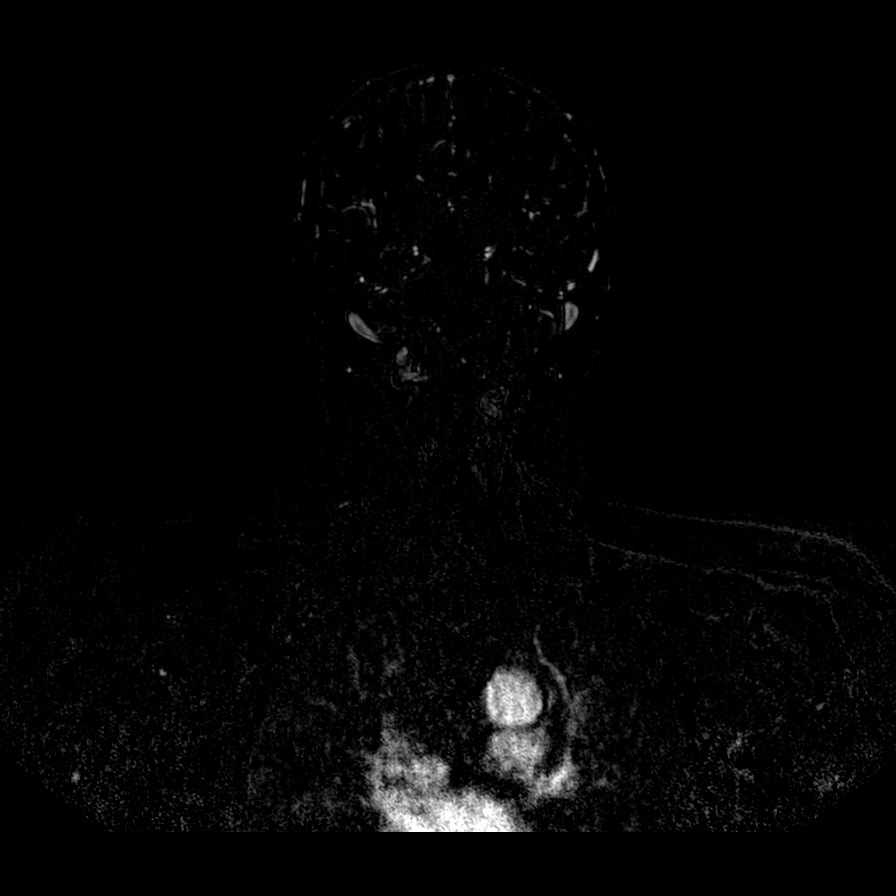

[9 of 48 positions shown; findings below may reference images not displayed]

Técnica:
As imagens de angiorressonância arterial cervical foram obtidas durante a administração intravenosa do gadolínio.
Relatório:
Arco aórtico, troncos supra-aórticos e artérias subclávias sem alterações.
As artérias vertebrais foram visibilizadas em toda extensão apresentando calibre e sinal de ﬂuxo normal.
Artérias carótidas comuns, bulbos carotídeos e artérias carótidas internas com trajeto preservado, apresentando
ANGIORRESSONÂNCIA ARTERIAL CERVICAL
discretos sinais de ateromatose, sem estenoses hemodinamicamente signiﬁcativas.
Impressão:
Ateromatose arterial, sem estenoses hemodinamicamente signiﬁcativas.

## 2023-11-19 ENCOUNTER — Ambulatory Visit (INDEPENDENT_AMBULATORY_CARE_PROVIDER_SITE_OTHER): Admitting: Internal Medicine

## 2023-11-19 DIAGNOSIS — I10 Essential (primary) hypertension: Secondary | ICD-10-CM

## 2023-11-19 DIAGNOSIS — I480 Paroxysmal atrial fibrillation: Secondary | ICD-10-CM

## 2023-11-19 DIAGNOSIS — E785 Hyperlipidemia, unspecified: Secondary | ICD-10-CM

## 2023-11-19 DIAGNOSIS — Z952 Presence of prosthetic heart valve: Secondary | ICD-10-CM

## 2023-11-19 MED ORDER — AMOXICILLIN 500 MG OR TABS
2000.0000 mg | ORAL_TABLET | Freq: Once | ORAL | 3 refills | Status: AC | PRN
Start: 2023-11-19 — End: ?

## 2023-11-19 MED ORDER — LOSARTAN POTASSIUM 25 MG OR TABS
25.0000 mg | ORAL_TABLET | Freq: Every day | ORAL | 3 refills | Status: AC
Start: 2023-11-19 — End: ?

## 2023-11-19 MED ORDER — ASPIRIN 81 MG OR TBEC
81.0000 mg | DELAYED_RELEASE_TABLET | Freq: Every day | ORAL | 3 refills | Status: AC
Start: 2023-11-19 — End: ?

## 2023-11-19 NOTE — Progress Notes (Signed)
 [facility]     Referring provider: Lucien Mozelle Plume    CC: MVR     History of Presenting Illness:   Cathy Lucas is a 73 year old female with history of MVR s/p bovine bioprosthetic MVR in 2012 s/p Sapien 3 mitral valve placement 2018 with 8 mmHg residual gradient, pAfib s/p MAZE and LAA surgical closure off AC, HTN, HLD, DM2, and RA who presents for follow up.       Interval hx:  She notes possible RA flare with left wrist.   Getting some dental work done.  No CP.  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       Past Medical / Surgical History:  Past Medical History:   Diagnosis Date    Back pain     Needs L4 fusion    Depression     Diabetes     Fibromyalgia     GERD (gastroesophageal reflux disease)     Glaucoma     Hypercholesterolemia     Hypertension     Hypothyroidism     Migraines     PAF (paroxysmal atrial fibrillation)        Social History:  Social History     Socioeconomic History    Marital status: Married     Spouse name: Not on file    Number of children: Not on file    Years of education: Not on file    Highest education level: Not on file   Occupational History    Not on file   Tobacco Use    Smoking status: Never    Smokeless tobacco: Never   Substance and Sexual Activity    Alcohol use: No    Drug use: No    Sexual activity: Not on file   Other Topics Concern    Caffeine Concern Not Asked   Social History Narrative    Not on file     Social Drivers of Health     Financial Resource Strain: Not on file   Food Insecurity: Not on file   Transportation Needs: Not on file   Physical Activity: Not on file   Stress: Not on file   Social  Connections: Not on file   Intimate Partner Violence: Not on file   Housing Stability: Not on file       Family History:  Family History   Problem Relation Name Age of Onset    Stroke Mother      Heart Disease Mother      Stroke Father      Heart Disease Father         Medications (medication list reviewed by me with the patient):   amoxicillin  Take 4 tablets (2,000 mg) by mouth once as needed (take 30-60 minutes before dental work) for up to 1 dose. 4 tablet 3    aspirin  Take 1 tablet (81 mg) by mouth daily. 90 tablet 3    atorvastatin  TAKE ONE TABLET BY MOUTH ONE TIME DAILY 90 tablet 3  buprenorphine  Suck 1 Film (150 mcg) in mouth 2 times daily.      carvedilol  TAKE ONE TABLET BY MOUTH TWICE DAILY (with meals) 180 tablet 3    clopidogrel  TAKE ONE TABLET BY MOUTH ONE TIME DAILY 90 tablet 3    cyclobenzaprine  Take 1 tablet (10 mg) by mouth as needed.      diphenhydrAMINE  Take 2 capsules (50 mg) by mouth every 6 hours as needed for Itching. 30 capsule 3    furosemide  Take 1 tablet (20 mg) by mouth daily as needed (leg swelling). 30 tablet 3    True Metrix Blood Glucose Test       HYDROmorphone  Take 1 tablet (4 mg) by mouth 4 times daily as needed.      hydroxychloroquine  Take 1 tablet (200 mg) by mouth daily.      SteriLance TL       latanoprost  Place 1 drop into both eyes every evening.      levothyroxine  Take 1 tablet (88 mcg) by mouth every morning (before breakfast).      lidocaine        losartan  Take 1 tablet (25 mg) by mouth daily. 90 tablet 3    Melatonin       Misc Natural Products (AIRBORNE ELDERBERRY PO)       Movantik  Take 1 tablet (25 mg) by mouth daily.      nitroGLYcerin  1 tablet (0.4 mg) by Sublingual route every 5 minutes as needed for Chest Pain. UP TO 3 PER EPISODE; keep one vial in fridge and one on person 50 tablet 3    ondansetron  Take 1 tablet (8 mg) by mouth every 8 hours as needed for Nausea/Vomiting. 15 tablet 1    pantoprazole  Take 1 tablet (40 mg) by mouth daily.      timolol  hemihydrate  Place 1 drop into both eyes 2 times daily. use in affected eye(s)      vitamin D3 Take 2.5 tablets (5,000 Units) by mouth daily.         Allergies:  Patient is allergic to ceftriaxone, iodine , iv contrast [contrast media], latex, metformin, ativan [lorazepam], definity  [perflutren  lipid microsphere], diclofenac, ibuprofen , januvia [sitagliptin], jardiance  [empagliflozin ], macrobid [nitrofurantoin], and tylenol  [acetaminophen ].    Physical Exam:  BP (!) 129/56 (BP Location: Left arm, BP Patient Position: Sitting, BP cuff size: Large)   Pulse 73   Temp 98.1 F (36.7 C) (Temporal)   Resp 18   Ht 5' 5 (1.651 m)   Wt 87.5 kg (193 lb)   LMP  (LMP Unknown)   SpO2 97%   BMI 32.12 kg/m  /      General: awake, alert, no acute distress  HEENT: NCAT, EOMI  Neck: JVP ~7cm  Lungs: clear to auscultation bilaterally, no wheezing/crackles  Heart: RRR, nl S1S2, no murmurs, rubs or gallops  Extremities: warm, well-perfused, no edema,  2+ radial pulses  Skin: dry, no rashes  Neuro: alert and oriented x3, no focal neurologic deficit    Lab Data (reviewed by me today):  Lab Results   Component Value Date    BUN 17 07/02/2020    CREAT 0.74 07/02/2020    CL 103 07/02/2020    NA 138 07/02/2020    K 4.2 07/02/2020    CA 10.0 07/02/2020    TBILI 0.30 07/02/2020    ALB 4.4 07/02/2020    TP 7.1 07/02/2020    AST 12 07/02/2020    ALK 92 07/02/2020    BICARB 24 07/02/2020  ALT 9 07/02/2020    GLU 137 (H) 07/02/2020     Lab Results   Component Value Date    WBC 7.3 07/02/2020    RBC 4.64 07/02/2020    HGB 13.4 07/02/2020    HCT 41.1 07/02/2020    MCV 88.6 07/02/2020    MCHC 32.6 07/02/2020    RDW 14.6 (H) 07/02/2020    PLT 218 07/02/2020    MPV 10.5 07/02/2020     Lab Results   Component Value Date    CHOL 151 03/10/2022    HDL 62 03/10/2022    LDLCALC 61 07/02/2020    TRIG 150 (H) 03/10/2022     Lab Results   Component Value Date    TSH 8.82 (H) 07/02/2020     Diagnostics (I reviewed all the test results below today):  EKG 03/22/20:   Normal sinus rhythm     CTA Coronary 11/2016  1. No obstructive coronary artery disease.  2. 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  3. Bioprosthetic mitral valve abnormal valve leaflet motion. Internal mitral annulus diameter is 22 x 22 mm.  4. Please see report for concurrently performed CT of the chest for discussion of extracardiac findings in the thorax.    Echo 09/28/22  Summary:   1. The left ventricular size is normal. The left ventricular systolic function is normal. Left ventricular ejection fraction by Simpson's biplane is 65 %.   2. The right ventricular size is normal and systolic function is normal.   3. Mildly elevated pulmonary artery pressure with right ventricular systolic pressure of 37 mmHg using an estimated right atrial pressure of 3 mmHg.   4. Bioprosthetic mitral valve is in a stable position (extends slightly into LVOT without significant obstruction) and a mean mitral gradient of 11 mmHg at a heart rate of 74 bpm.   5. Compared to prior study MV gradient is slightly higher with a higher heart rate. Bioprosthesis is otherwise well seated.    Echo 04/30/23   1. The left ventricular size is normal. The left ventricular systolic function is normal. Left ventricular ejection fraction by Simpson's biplane is 60 %.   2. The right ventricular size is normal and systolic function is normal.   3. Mildly elevated pulmonary artery pressure with right ventricular systolic pressure of 38 mmHg using an estimated right atrial pressure of 3 mmHg.   4. Bioprosthetic mitral valve is well-seated without regurgitation. Mean diastolic gradient is 7.7 mmHg at a heart rate of 71 bpm.   5. Compared to prior study mean mitral gradient is lower.       Assessment/Plan:  73 year old female with endocarditis s/p MVR who presents for follow up.     # Endocarditis/MVR:   s/p bovine bioprosthetic MVR in 2012 s/p Sapien 3 in mitral position 2018. Finished 6 mo of  warfarin. Now on DAPT per interventional team due to mitral position of the valve  - continue aspirin  81 mg qday and plavix  75 mg qday  - will need antibiotics before any dental work  - prescribed  - Moderate MS mean gradient of 7.7 mmH - stable  - Continue coreg  25 mg BID  - taking lasix  once a month. Edema is mainly in joints, related to RA rather than CHF symptom  - repeat echo March 2026      # PAF:   No known recurrence recently. S/p  LAA Closure + MAZE. Pt is off anticoagulation. We discussed previously that this strategy is not studied. Pt is OK to stay on DAPT only  - Continue Coreg  25 mg BID  - DAPT as above  - advised for any CVA/TIA symptoms to go to ED for evaluation     # HTN  At goal. If BP trends lower, may need to lower losartan  dose again. Continue coreg  due to mitral gradient  - Continue losartan  25 mg daily + Coreg  25 mg BID    #. Dyslipidemia:   Lipids at goal.   - Goal LDL <70, continue Atorvastatin  20 mg daily.     # fall/weakness  Has been sick recently. RA flares make it hard to exercise at times. Highly recommend discussing getting a PT eval close to home for stability/fall prevention      # Pulmonary HTN  Mild on recent echo. Prev improved significantly after MVR      # DM2  Needs good control to reduce risk of cardiovascular complications. A1c reportedly improving. Follows with new PCP for control. Pt instructed to follow up with PCP since last A1c was high.       Patient Instructions   Schedule echo for March 2026 and see me after   Return in about 6 months (around 05/18/2024).    My above recommendations will be communicated with the referring physician by way of letter or the electronic medical record.    Please don't hesitate to page me should you have any questions regarding the care of this patient.      Mozelle Shark, MD  Cardiology

## 2023-11-19 NOTE — Patient Instructions (Signed)
 Schedule echo for March 2026 and see me after

## 2023-12-10 ENCOUNTER — Telehealth (INDEPENDENT_AMBULATORY_CARE_PROVIDER_SITE_OTHER): Payer: Self-pay | Admitting: Internal Medicine

## 2023-12-10 NOTE — Telephone Encounter (Signed)
 Per chart pt is on the following Cardiac Meds:    Asprin  Atorvastatin   Carvedilol   Plavix   Lasix   Losartan   Nitro

## 2023-12-10 NOTE — Telephone Encounter (Signed)
 Patient is calling to find out if she can take Claritin for her allergies along with her cardiac medications.,

## 2023-12-11 NOTE — Telephone Encounter (Signed)
 Called and LVM for pt   Let her know that per Dr Orvin it is OK to take Claritin  Asked pt to please call back if she had further questions

## 2023-12-18 ENCOUNTER — Ambulatory Visit (INDEPENDENT_AMBULATORY_CARE_PROVIDER_SITE_OTHER): Payer: Self-pay | Admitting: Internal Medicine

## 2023-12-18 LAB — LIPID(CHOL FRACT) PANEL, BLOOD
Cholesterol: 98 mg/dL — ABNORMAL LOW (ref 100–199)
HDL Cholesterol: 48 mg/dL (ref >39)
LDL Chol CAL (NIH) - LABCORP: 34 mg/dL (ref 0–99)
Non-HDL Cholesterol: 50 mg/dL (ref 0–129)
Triglycerides: 79 mg/dL (ref 0–149)
VLDL Cholesterol CAL: 16 mg/dL (ref 5–40)

## 2023-12-18 LAB — COMPREHENSIVE METABOLIC PANEL, BLOOD
ALT (SGPT): 8 IU/L (ref 0–32)
AST: 19 IU/L (ref 0–40)
Albumin: 4.5 g/dL (ref 3.9–4.9)
Alkaline Phos: 83 IU/L (ref 49–135)
BUN/Creatinine Ratio: 20 (ref 12–28)
BUN: 17 mg/dL (ref 8–27)
Bilirubin, Total: 0.5 mg/dL (ref 0.0–1.2)
Calcium: 9.2 mg/dL (ref 8.7–10.3)
Carbon Dioxide: 23 mmol/L (ref 20–29)
Chloride: 101 mmol/L (ref 96–106)
Creatinine: 0.85 mg/dL (ref 0.57–1.00)
EGFR: 74 mL/min/1.73 (ref >59)
Globulin, Total: 2.4 g/dL (ref 1.5–4.5)
Glucose: 120 mg/dL — ABNORMAL HIGH (ref 70–99)
Potassium: 3.6 mmol/L (ref 3.5–5.2)
Protein, Total, Serum: 6.9 g/dL (ref 6.0–8.5)
Sodium: 138 mmol/L (ref 134–144)

## 2023-12-18 LAB — HEMOGRAM, BLOOD
Hematocrit: 37.6 % (ref 34.0–46.6)
Hemoglobin: 12.4 g/dL (ref 11.1–15.9)
MCH: 29.5 pg (ref 26.6–33.0)
MCHC: 33 g/dL (ref 31.5–35.7)
MCV: 89 fL (ref 79–97)
Platelets: 204 x10E3/uL (ref 150–450)
RBC: 4.21 x10E6/uL (ref 3.77–5.28)
RDW: 13.3 % (ref 11.7–15.4)
WBC: 5.9 x10E3/uL (ref 3.4–10.8)

## 2023-12-18 LAB — LDL CHOLESTEROL (DIRECT), BLOOD: LDL Chol. (Direct): 36 mg/dL (ref 0–99)

## 2024-04-28 ENCOUNTER — Other Ambulatory Visit (INDEPENDENT_AMBULATORY_CARE_PROVIDER_SITE_OTHER)

## 2024-05-05 ENCOUNTER — Encounter (INDEPENDENT_AMBULATORY_CARE_PROVIDER_SITE_OTHER): Admitting: Internal Medicine
